# Patient Record
Sex: Female | Born: 1955 | Race: Black or African American | Hispanic: No | Marital: Married | Smoking: Current every day smoker
Health system: Southern US, Community
[De-identification: ages and names within clinical notes are randomized; demographics above are authoritative.]

## PROBLEM LIST (undated history)

## (undated) ENCOUNTER — Emergency Department: Admission: EM | Payer: Medicare HMO | Source: Home / Self Care

## (undated) DIAGNOSIS — F1020 Alcohol dependence, uncomplicated: Secondary | ICD-10-CM

## (undated) DIAGNOSIS — I10 Essential (primary) hypertension: Secondary | ICD-10-CM

## (undated) DIAGNOSIS — R569 Unspecified convulsions: Secondary | ICD-10-CM

## (undated) DIAGNOSIS — E119 Type 2 diabetes mellitus without complications: Secondary | ICD-10-CM

## (undated) DIAGNOSIS — E039 Hypothyroidism, unspecified: Secondary | ICD-10-CM

## (undated) HISTORY — PX: OTHER SURGICAL HISTORY: SHX169

---

## 2001-08-22 ENCOUNTER — Other Ambulatory Visit: Admission: RE | Admit: 2001-08-22 | Discharge: 2001-08-22 | Payer: Self-pay | Admitting: Family Medicine

## 2001-10-22 ENCOUNTER — Inpatient Hospital Stay (HOSPITAL_COMMUNITY): Admission: EM | Admit: 2001-10-22 | Discharge: 2001-10-26 | Payer: Self-pay | Admitting: *Deleted

## 2001-10-27 ENCOUNTER — Other Ambulatory Visit (HOSPITAL_COMMUNITY): Admission: RE | Admit: 2001-10-27 | Discharge: 2001-11-11 | Payer: Self-pay | Admitting: Psychiatry

## 2005-04-16 ENCOUNTER — Inpatient Hospital Stay: Payer: Self-pay | Admitting: Internal Medicine

## 2005-06-28 ENCOUNTER — Emergency Department: Payer: Self-pay | Admitting: Emergency Medicine

## 2005-06-28 ENCOUNTER — Other Ambulatory Visit: Payer: Self-pay

## 2005-07-07 ENCOUNTER — Emergency Department: Payer: Self-pay | Admitting: Emergency Medicine

## 2005-11-20 ENCOUNTER — Ambulatory Visit (HOSPITAL_COMMUNITY): Admission: RE | Admit: 2005-11-20 | Discharge: 2005-11-20 | Payer: Self-pay | Admitting: Family Medicine

## 2006-01-15 ENCOUNTER — Ambulatory Visit (HOSPITAL_COMMUNITY): Admission: RE | Admit: 2006-01-15 | Discharge: 2006-01-15 | Payer: Self-pay | Admitting: General Surgery

## 2006-01-15 ENCOUNTER — Encounter (INDEPENDENT_AMBULATORY_CARE_PROVIDER_SITE_OTHER): Payer: Self-pay | Admitting: Specialist

## 2007-01-11 ENCOUNTER — Inpatient Hospital Stay (HOSPITAL_COMMUNITY): Admission: EM | Admit: 2007-01-11 | Discharge: 2007-01-13 | Payer: Self-pay | Admitting: Emergency Medicine

## 2007-03-10 ENCOUNTER — Inpatient Hospital Stay: Payer: Self-pay | Admitting: Unknown Physician Specialty

## 2007-03-10 ENCOUNTER — Other Ambulatory Visit: Payer: Self-pay

## 2007-08-11 ENCOUNTER — Ambulatory Visit (HOSPITAL_COMMUNITY): Admission: RE | Admit: 2007-08-11 | Discharge: 2007-08-11 | Payer: Self-pay | Admitting: Family Medicine

## 2009-11-07 ENCOUNTER — Inpatient Hospital Stay: Payer: Self-pay | Admitting: Internal Medicine

## 2011-02-27 NOTE — H&P (Signed)
[facility]  Patient:    Shannon Herrera, Shannon Herrera Visit Number: [id number] MRN: [id number]          Service Type: PSY Location: 500 [id number] [id number] Attending Physician:  Jasmine Pang Dictated by:   Candi Leash. Orsini, N.P. Admit Date:  10/22/2001                     Psychiatric Admission Assessment  IDENTIFYING INFORMATION:  A 55 year old married female voluntary admitted on October 22, 2001 with a history of alcohol abuse and depression.  HISTORY OF PRESENT ILLNESS:  The patient presents with a history of depression and alcohol abuse.  The patients drinking habits have been escalating over the past month, drinking about a pint of gin every day.  The patient reports she drinks if she is depressed, isolating herself, having suicidal thoughts to jump off a balcony and overdose to "end it all."  The patient identifies no specific triggers, does feel some decrease in self esteem, unable to contribute to family financially.  The patient does want to stop drinking, is feeling very "tired."  Her sleep has decreased, appetite has decreased.  She has unknown weight loss.  She reports some positive visual hallucinations, no auditory hallucinations, no paranoia.  Patient is reporting some nausea at this time.  PAST PSYCHIATRIC HISTORY:  Second hospitalization to [facility], was at [facility] for depression, no outpatient treatment, no prior suicide attempt.  SOCIAL HISTORY:  She is a 55 year old married female, married for 18 years, 3 children ages 2, 24, and 37.  She lives with her husband and children.  She is unemployed.  No legal problems.  Completed high school and 2 years of college.  FAMILY HISTORY:  Father with alcohol.  Brother recovering alcoholic.  ALCOHOL DRUG HISTORY:  She is a nonsmoker.  She started drinking at the age of 20, has been drinking 1 pint of gin every day for the past month.  No history of detox, no history of blackouts or  seizures.  Last drink was on October 22, 2001, about 1:30.  Denies any substance abuse.  PAST MEDICAL HISTORY:  Primary care Vasil Juhasz is Dr. Sherrie Mustache in [place]. Medical problems are none.  Medications are Zoloft 50 mg for the past few months, Ambien 10 mg q.h.s.  DRUG ALLERGIES:  No known allergies.  PHYSICAL EXAMINATION  REVIEW OF SYSTEMS:  Patient denies any fever or chills, has a small change in appetite with no significant  weight loss.  No blurred or double vision.  No hearing loss or sinus pain.  No chest pain or palpitations.  No cough or shortness of breath.  Patient is a nonsmoker.  No heartburn or change in habits.  GI:  No dysuria, frequency, or hematuria.  MUSCULOSKELETAL:  No stiffness or swelling or joint pain.  SKIN: No itching, wounds or rash. NEUROLOGIC:  No weakness or numbness.  PSYCHIATRIC:  Recent history of depression with some suicidal thoughts.  No suicide attempts. ENDOCRINE:  No No endocrine or diabetic problems.  LYMPHATIC:  No history of anemia. ALLERGIES:  No environmental allergies.  GENERAL APPEARANCE:  A 55 year old female in no acute distress, well groomed, alert and cooperative.  VITAL SIGNS:  98.4, 100 pulse, 12 respirations, blood pressure 170/100.  HEAD:  Normocephalic, she can raise her eyebrows.  Her hair is clean, evenly distributed.  EYES:  EOMs are intact bilaterally.  MOUTH:  External ear canals are patent.  Her hearing is appropriate to conversation.  No sinus tenderness or nasal discharge.  Mucosa is moist, fair dentition. No lesions were seen. Tongue protrudes midline without tremor.  Can clench her teeth and puff out her cheeks.  NECK:  Supple, no JVD, negative lymphadenopathy.  Thyroid is nonpalpable and nontender.  Trachea is midline.  CHEST:  Clear to auscultation.  No adventitious sounds.  No cough.  HEART:  Tachycardic, no murmurs, gallops or rubs.  Carotid pulses are equal and adequate.  BREAST EXAM is  deferred.  ABDOMEN:  Soft, nontender abdomen.  MUSCULOSKELETAL:  No joint swelling or deformity.  Good range of motion. Muscle strength and tone is equal bilaterally.  SKIN:  Warm and dry with good turgor, no cyanosis.  Nail beds are clean. NEUROLOGIC:  She is oriented x 3.  Cranial nerves are grossly intact.  Good grip strength bilaterally.  No involuntary movements.  Gait is normal. Cerebellar functions were intact, with heel-to-shin, normal alternating movements.  Romberg was negative.  HEALTH MAINTENANCE ISSUES:  Addressed.  LABORATORY DATA:  WBC count is low at 2.7.  H&H is low ast 12.5 and 35.8. MCVs are low at 35.8.  Platelet count is 53,000.  Total bili is 2.7.  SGOT is 167, SGPT is 96, glucose is 146, BUN is 3.  MENTAL STATUS EXAMINATION:  Patient is an alert, middle-aged, average weight mix of African-American and Caucasian, casually dressed, cooperative, good eye contact.  Speech is normal and relevant.  Mood is sad, affect pleasant, sad and anxious.  Thought processes are coherent.  Patient is expressing some visual hallucinations prior to admission, no auditory hallucinations, no suicidal or homicidal ideations, no paranoia.  Cognitive function is intact. Memory is good, judgment is fair, insight is good.  ADMISSION DIAGNOSES: Axis I:    1. Depression not otherwise specified.            2. Alcohol dependence. Axis II:   Deferred. Axis III:  None. Axis IV:   Problems with economics, other psychosocial problems. Axis V:    Current 35, estimated this past year 42.  INITIAL PLAN OF CARE:  Voluntary admission to [facility] for depression and suicidal thoughts, alcohol abuse.  Contract for safety, check every 15 minutes.  Will initiate the phenobarbital protocol, encourage fluids, continue Zoloft, will monitor labs.  Will consult with [facility] for patients laboratory work.  Our goal is to detox safely and to stabilize mood and thinking so patient can be safe,  for patient to remain alcohol free, and follow up with mental health and AA.   TENTATIVE LENGTH OF STAY:  3 to 5 days. Dictated by:   Candi Leash. Orsini, N.P. Attending Physician:  Jasmine Pang DD:  10/15/01 TD:  10/25/01 Job: [id number]

## 2011-02-27 NOTE — Op Note (Signed)
Shannon Herrera, Shannon Herrera               ACCOUNT NO.:  [id number]   MEDICAL RECORD NO.:  [id number]          PATIENT TYPE:  AMB   LOCATION:  DAY                           FACILITY:  [facility]   PHYSICIAN:  Jerolyn Shin C. Katrinka Blazing, M.D.   DATE OF BIRTH:  15-Jun-1956   DATE OF PROCEDURE:  01/15/2006  DATE OF DISCHARGE:                                 OPERATIVE REPORT   PREOPERATIVE DIAGNOSES:  Upper back mass.   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   PROCEDURE:  Excision of fibrolipoma of the back full-thickness 6 x 6 cm.   SURGEON:  Dirk Dress. Katrinka Blazing, M.D.   DESCRIPTION OF PROCEDURE:  The patient was placed under general anesthesia.  She was positioned in the modified prone position. Her neck and upper back  were prepped and draped in a sterile field. A transverse incision was made  over the mass. The mass appeared to be a very fibrous mass that was  infiltrated with fatty tissue suggesting a fibrolipoma or a lipofibroma. It  did not come out easily and had to be circumferentially excised. This was  done with electrocautery. It extended down to the fascia. It was totally  removed. There was essentially no blood loss.  The edges of the wound were  slightly redundant but it was felt that once healing occurred that this  would be good in allowing the wound to even out. The superior and inferior  flaps were sutured down to the fascia using serial rows of 2-0 Monocryl. The  edges of the skin were then reapproximated using 3-0 Monocryl and the skin  was closed with subcuticular 4-0 Monocryl. A compression dressing was  placed. The patient tolerated the procedure well. She was turned to the  supine position, extubated without difficulty and transferred to the post  anesthesia care unit in satisfactory condition. The patient was noted to  have a platelet count of 67,000, white count of 2.3000 preoperatively.  Though this did not interfere with her surgery, this is something that needs  to  be worked up and this note is being placed so that her primary care  Joyell Emami can proceed to have her referred to hematology for evaluation of  her thrombocytopenia and leukopenia.      Dirk Dress. Katrinka Blazing, M.D.  Electronically Signed     LCS/MEDQ  D:  01/15/2006  T:  01/16/2006  Job:  [id number]

## 2011-02-27 NOTE — Discharge Summary (Signed)
[facility]  Patient:    Shannon Herrera, Shannon Herrera Visit Number: [id number] MRN: [id number]          Service Type: PSY Location: CIOP Attending Physician:  Rachael Fee Dictated by:   Reymundo Poll Dub Mikes, M.D. Admit Date:  10/27/2001 Discharge Date: 11/11/2001                             Discharge Summary  CHIEF COMPLAINT AND HISTORY OF PRESENT ILLNESS:  This was the second admission to [facility] for this 55 year old female voluntarily admitted.  History of alcohol dependence and depression.  Drinking habits had been escalating over the past month prior to this admission, drinking about a pint of gin every day.  Reported she drinks if she is depressed, isolating herself, having suicidal thoughts to jump off a balcony or overdose to end it all.  Identified no specific triggers, does feel some decreasing self-esteem, unable to contribute to family financially.  Does want to stop drinking, was feeling very tired.  Sleep has decreased, appetite has decreased and she has had some weight loss.  Positive for visual hallucinations, no auditory hallucinations, no paranoia.  Some nausea.  PAST PSYCHIATRIC HISTORY:  Second hospitalization at [facility].  Several attempts at outpatient treatment but noncompliant.  SUBSTANCE ABUSE HISTORY:  Drinking at the age of 74, has been drinking one pint of gin every day for the past month.  No history of withdrawal, no history of blackouts or seizures.  Last drink was January 11.  No other substance abuse.  PAST MEDICAL HISTORY:  Denied history of any major medical conditions.  MEDICATIONS ON ADMISSION: 1. Zoloft 50 mg per day. 2. Ambien 10 mg at bedtime for sleep.  PHYSICAL EXAMINATION:  GENERAL:  Performed and failed to show any acute findings.  MENTAL STATUS EXAMINATION ON ADMISSION:  Well-nourished, well-developed, alert, cooperative female, casually dressed, good eye  contact.  Speech was normal and relevant.  Mood was sad.  Affect was depressed, sad, and anxious. Thought processes: Coherent; expressing some visual hallucinations prior to this admission, no auditory hallucinations, no suicidal or homicidal ideas, no paranoia.  Cognitive: Intact.  ADMITTING DIAGNOSES: Axis I:    1. Alcohol dependence.            2. Depressive disorder, not otherwise specified. Axis II:   Deferred. Axis III:  No diagnosis. Axis IV:   Moderate. Axis V:    Global assessment of functioning upon admission 35, highest global            assessment of functioning in the last year 65-70.  LABORATORY DATA:  CBC showed white blood cells to be low from 2.7 to 2.3, hemoglobin from 11.7 to 12.5, hematocrit 37.8 to 33.3.  PT and PTT were within normal limits.  SGOT 167 then down to 78, SGPT 96 then down to 58, total bilirubin 2.7 down to 1.6.  TSH was 10.923.  Hepatitis profile: Positive for HCV.  HOSPITAL COURSE:  She was admitted and detoxified using phenobarbital. Detoxification went uneventfully.  She said that this time around she really wanted to abstain.  She was placed back on the Zoloft 50 mg per day.  SHe was given the Ambien that she used to take before.  She claimed increased understanding of the pattern of getting depressed and this triggering depression, expressed commitment to pursue further outpatient treatment this time around.  On January 19  she was in full contact with reality, fully detoxified, still experiencing the depression but no suicidal ideas, no homicidal ideas, willing and motivated to pursue further treatment so she was discharged to come to the [facility] for further work on her alcohol dependency.  DISCHARGE DIAGNOSES: Axis I:    1. Alcohol dependence.            2. Depressive disorder, not otherwise specified. Axis II:   No diagnosis. Axis III:  1. Hepatitis.            2. Anemia. Axis IV:   Moderate. Axis V:    Global assessment of  functioning upon discharge 55.  DISCHARGE MEDICATIONS: 1. Multivitamin. 2. Magnesium. 3. Atenolol 100 mg daily. 4. Zoloft 50 mg daily.  FOLLOWUP: 1. [facility]. 2. To see Dr. Sherrie Mustache, primary doctor, for further followup on her thyroid and    her hepatitis status. Dictated by:   Reymundo Poll Dub Mikes, M.D. Attending Physician:  Rachael Fee DD:  12/28/01 TD:  12/28/01 Job: [id number]

## 2011-02-27 NOTE — Discharge Summary (Signed)
NAMESAILOR, KALLENBERGER               ACCOUNT NO.:  [id number]   MEDICAL RECORD NO.:  [id number]          PATIENT TYPE:  INP   LOCATION:  A223                          FACILITY:  [facility]   PHYSICIAN:  Osvaldo Shipper, MD     DATE OF BIRTH:  1956/02/05   DATE OF ADMISSION:  01/11/2007  DATE OF DISCHARGE:  04/03/2008LH                               DISCHARGE SUMMARY   Please review H&P dictated by Dr. Benson Setting for details regarding patient's  presenting illness.   Patient goes to the health department to get her medical care.   DISCHARGE DIAGNOSES:  1. New onset type 2 diabetes.  2. Thrombocytopenia of unclear etiology.  3. History of hypertension and hypothyroidism, stable.   BRIEF HOSPITAL COURSE:  1. New onset diabetes.  Patient is a 55 year old female with a history      of hypertension and hypothyroidism, who presented to the emergency      room with complaints of weakness, visual disturbances, polydipsia,      polyuria.  Upon evaluation in the ED, she was found to have a blood      sugar of 774.  Patient denied any history of diabetes in the past.      She was not in ketoacidosis.  Patient was put on an insulin drip,      and her blood sugars were brought under control rather quickly.      She was started on Lantus insulin yesterday.  She was started on a      p.o. diet yesterday.  Patient has been doing well symptomatically.      Her visual symptoms have improved.  She is able to see more      clearly.  I am going to further adjust her Lantus dose today, and I      have asked her to maintain a diary with all of her CBGs, so that      she can take it to the health department, so that her Lantus may      further be adjusted if needed.  Patient received extensive diabetes      education, during her hospital stay.  She also viewed videos      regarding diabetes and management.  She was seen by a nutritionist.      She was taught how to administer insulin to herself.  I also  counseled her about potential medical complications resulting from      poorly-controlled diabetes.  I have also explained to her the need      to follow with an ophthalmologist, within the next month or so.  In      all, patient has received a comprehensive education regarding      diabetes care and management.  Blood sugar this morning is 217.  I      am going to give her an extra dose of Lantus and increase her basal      dose of Lantus, and hopefully this should control the sugars.  I      have told her that if her blood sugars stay about  200 consistently,      she needs to call her medical Toby Breithaupt, so that the doses may be      adjusted.  If her blood sugars go above 400, she needs to seek      attention immediately.  A Glucometer and testing supplies will also      be provided to the patient.   During the hospital stay, patient was found to have a platelet count of  66, hemoglobin 12.7, white count 3.6.  Review of her old labs reveal a  platelet count in the 60s back in April 2007, as well.  Patient reports  dealing with alcoholism about a year and a half ago, but she has been  abstaining from alcohol use since then.  I am not sure why she has a low  platelet count at this point, although it could be from remnants of her  alcohol toxicity.  She does not have a splenomegaly that I can detect,  based on physical examination.  I think the patient would benefit from  at least having one consultation with Dr. Mariel Sleet, which we will  arrange.  She does not have any evidence for any bleeding at this point.  A repeat CBC will be checked in one week's time, to ensure stability in  the counts.   Other medical issues including hypertension, hypothyroidism have been  stable. Patient also had mild acute renal failure, which also resolved  with IV hydration.   On the day of discharge, patient is feeling quite well.  She has  tolerated a p.o. intake, without any difficulty.  She has been   ambulating with no difficulty as well.  She is agreeable to go home  today.  Vital signs also show stability.  Blood pressure is a little bit  elevated, but I think once she resumes her medications, this will come  in control.  Her HBA1c unfortunately is still pending.  Examination  today was again unremarkable and stable from yesterday.  As mentioned  above, she had a white count of 3.6, platelet count of 66.  Her sodium  was 133, glucose 207 this morning.  Renal function normal.  HBA1c is  pending.  LDL was 98, triglycerides 884, HDL is 20.   ASSESSMENT/PLAN:  As above.   She also yesterday was found to have elevated AST and ALT, which also  will need to be evaluated as an outpatient.   DISCHARGE MEDICATIONS:  1. Lantus insulin 16 subcu every morning.  2. She may resume Synthroid 50 mcg daily.  3. Lisinopril/hydrochlorothiazide 10/12.5 once daily.   She has been asked to check her blood sugars three times daily, before  each meal.  Call her doctor, if values are more than 200 consistently or  below.   FOLLOWUP:  Follow up with her Reyna Lorenzi, Vertis Kelch, at the health  department within the next 5 days, to ensure close followup.   PENDING STUDIES:  We will send off Hepatitis profile before she is  discharged.  HBA1c is also pending at this point.   DIET:  Modified carbohydrate diet as instructed by the nutritionist.   PHYSICAL ACTIVITY:  No restrictions.   Total time of discharge 40 minutes.      Osvaldo Shipper, MD  Electronically Signed     GK/MEDQ  D:  01/13/2007  T:  01/13/2007  Job:  [id number]

## 2011-02-27 NOTE — H&P (Signed)
Shannon Herrera, Shannon Herrera                ACCOUNT NO.:  [id number]   MEDICAL RECORD NO.:  [id number]         PATIENT TYPE:  AMB   LOCATION:                                FACILITY:  [facility]   PHYSICIAN:  Jerolyn Shin C. Katrinka Blazing, M.D.   DATE OF BIRTH:  1955-11-29   DATE OF ADMISSION:  DATE OF DISCHARGE:  LH                                HISTORY & PHYSICAL   55 year old female with a history of a mass of her upper back with  increasing pain.  She has pain with movement.  There is no history of  injury.  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  She is scheduled  to have it excised under anesthesia.   PAST MEDICAL HISTORY:  She has hypothyroidism, depression, osteoarthritis.   ALLERGIES:  None.   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   MEDICATIONS:  Lexapro 20 mg daily, L-Thyroxine 50 mg daily, Doxycycline 100  mg daily.   FAMILY HISTORY:  Positive for hypertension, atherosclerotic heart disease,  diabetes, stroke, kidney disease.   PHYSICAL EXAMINATION:  VITAL SIGNS:  Blood pressure 150/82, pulse 76, respirations 20, weight 150  pounds.  HEENT:  Unremarkable.  NECK:  Supple, no JVD, bruit, adenopathy, or thyromegaly.  CHEST:  Clear to auscultation.  HEART:  Regular rate and rhythm without murmurs, gallops, and rubs.  ABDOMEN:  Soft, nontender, no masses.  BACK:  6 by 6 cm mass of the mid upper back at the base of the neck.  EXTREMITIES:  No cyanosis, clubbing, and edema.  NEUROLOGICAL:  No focal motor, sensory, or cerebellar deficits.   IMPRESSION:  Soft tissue mass, mid upper back, suspected lipoma.   PLAN:  Resection under anesthesia.      Dirk Dress. Katrinka Blazing, M.D.  Electronically Signed     LCS/MEDQ  D:  01/14/2006  T:  01/14/2006  Job:  [id number]   cc:   Tillman Abide, NP

## 2011-02-27 NOTE — H&P (Signed)
NAMESIARAH, Shannon Herrera               ACCOUNT NO.:  [id number]   MEDICAL RECORD NO.:  [id number]          PATIENT TYPE:  INP   LOCATION:  A223                          FACILITY:  [facility]   PHYSICIAN:  Marcello Moores, MD   DATE OF BIRTH:  09/04/1956   DATE OF ADMISSION:  01/11/2007  DATE OF DISCHARGE:  LH                              HISTORY & PHYSICAL   PMD:  Unassigned   CHIEF COMPLAINT:  Weakness, visual disturbance, polydipsia and polyuria  for the last 3 days   HISTORY OF PRESENT ILLNESS:  Shannon Herrera is a 55 year old female patient  with history of hypothyroidism and hypertension, presented to the  emergency room with the above complaints.  The patient stated that she  started to have visual disturbance, generalized weakness, polyuria and  polydipsia  for the last 3 days.  She had never had such problem before.  The patient stated that she had measured her blood glucose one years of  lower in [facility] and it was normal.  She was  treated for hypothyroidism and hypertension and she was neurologically  healthy.  She denied any GI complaints.  She denied any vomiting or  diarrhea.  She has no fever, no cough or chest pain.  She has polyuria  but not urgency.   REVIEW OF SYSTEMS:  The patient admitted she has visual disturbance with  lightheadedness but she has no other complaints and most of the  complaints are dictated on the HPI.   ALLERGIES:  NO KNOWN DRUG ALLERGIES   SOCIAL HISTORY:  She denied smoking, alcohol use and drug abuse.   FAMILY HISTORY:  She admitted that mother and grandfather and aunt have  diabetes mellitus   PAST MEDICAL HISTORY:  1. Hypothyroidism.  2. Hypertension.   HOME MEDICATIONS:  1. Levothyroxine 50 mcg daily.  2. Lisinopril hydrochlorothiazide 10 mg/12.5 mg once a day.   PHYSICAL EXAMINATION:  The patient is lying in the bed without any  respiratory distress.  VITAL SIGNS: Blood pressure is 120/76 and temperature is  97.8 and pulse  103 and respiratory rate is 18 per minute and she is saturating 98%.  HEENT: She has pink conjunctivae.  Nonicteric sclera slightly dry buccal  mucosa.  NECK:  Supple.  No lymphadenopathy.  CHEST: Good air entry bilateral, clear.  CVS: S1-S2 well heard, a little bit tachycardic.  No murmur.  ABDOMEN: Soft.  No area of tenderness.  Normoactive bowel sounds.  EXTREMITIES:  No pedal edema.  Peripheral pulses positive.  CNS: She is alert and well oriented.  No neurological deficit.   LABS:  ABG  on room, pH is 7.24 and pCO2 is 39, pO2 is 90, bicarb 25 and  saturation 97.7.  White blood cells 4.25 and hemoglobin is 15,  hematocrit 42, platelet count is 107.  On the chemistry, sodium is 124  and potassium is 3.9, chloride is 83, glucose is 774, BUN 26, creatinine  1.4, calcium is 10.2   ASSESSMENT:  1. Newly diagnosed diabetes mellitus, uncontrolled but there is no      sign of diabetic  ketoacidosis.  Plan:  We will put her on      Glucommander monitoring with IV insulin drip and will manage      according to the  Glucommander results and will monitor the      chemistry every 4 hours and will send hemoglobin A1c and will send      also tomorrow morning a lipid profile.  2. Dehydration secondary to the polyuria hyperglycemia and will      rehydrate her with normal saline  and will monitor her vital signs      and her rehydration.  The patient is educated briefly what is      diabetes mellitus and its complications and diet and she will be      more educated  on the floor about diabetic complications and diet      control and importance of regular follow-up.  3. Hypertension and hypothyroidism, stable.  Will continue with her      home medications.      Marcello Moores, MD  Electronically Signed     MT/MEDQ  D:  01/11/2007  T:  01/11/2007  Job:  [id number]

## 2014-02-05 ENCOUNTER — Ambulatory Visit: Payer: Self-pay | Admitting: Physician Assistant

## 2014-02-19 ENCOUNTER — Ambulatory Visit: Payer: Self-pay | Admitting: Internal Medicine

## 2014-02-19 DIAGNOSIS — Z0289 Encounter for other administrative examinations: Secondary | ICD-10-CM

## 2014-03-30 ENCOUNTER — Ambulatory Visit: Payer: Self-pay | Admitting: Internal Medicine

## 2018-01-22 ENCOUNTER — Emergency Department (HOSPITAL_COMMUNITY): Payer: Medicaid Other

## 2018-01-22 ENCOUNTER — Other Ambulatory Visit: Payer: Self-pay

## 2018-01-22 ENCOUNTER — Inpatient Hospital Stay (HOSPITAL_COMMUNITY)
Admission: EM | Admit: 2018-01-22 | Discharge: 2018-01-25 | DRG: 897 | Disposition: A | Discharge status: Home / Self Care | Payer: Self-pay | Attending: Internal Medicine | Admitting: Internal Medicine

## 2018-01-22 ENCOUNTER — Encounter (HOSPITAL_COMMUNITY): Payer: Self-pay | Admitting: Emergency Medicine

## 2018-01-22 DIAGNOSIS — F10939 Alcohol use, unspecified with withdrawal, unspecified: Secondary | ICD-10-CM | POA: Diagnosis present

## 2018-01-22 DIAGNOSIS — R739 Hyperglycemia, unspecified: Secondary | ICD-10-CM

## 2018-01-22 DIAGNOSIS — F10239 Alcohol dependence with withdrawal, unspecified: Secondary | ICD-10-CM | POA: Diagnosis present

## 2018-01-22 DIAGNOSIS — F419 Anxiety disorder, unspecified: Secondary | ICD-10-CM | POA: Diagnosis present

## 2018-01-22 DIAGNOSIS — E872 Acidosis, unspecified: Secondary | ICD-10-CM | POA: Diagnosis present

## 2018-01-22 DIAGNOSIS — K7030 Alcoholic cirrhosis of liver without ascites: Secondary | ICD-10-CM | POA: Diagnosis present

## 2018-01-22 DIAGNOSIS — I10 Essential (primary) hypertension: Secondary | ICD-10-CM

## 2018-01-22 DIAGNOSIS — Z9114 Patient's other noncompliance with medication regimen: Secondary | ICD-10-CM

## 2018-01-22 DIAGNOSIS — F10931 Alcohol use, unspecified with withdrawal delirium: Secondary | ICD-10-CM

## 2018-01-22 DIAGNOSIS — R569 Unspecified convulsions: Secondary | ICD-10-CM | POA: Diagnosis present

## 2018-01-22 DIAGNOSIS — N179 Acute kidney failure, unspecified: Secondary | ICD-10-CM

## 2018-01-22 DIAGNOSIS — R945 Abnormal results of liver function studies: Secondary | ICD-10-CM

## 2018-01-22 DIAGNOSIS — F329 Major depressive disorder, single episode, unspecified: Secondary | ICD-10-CM | POA: Diagnosis present

## 2018-01-22 DIAGNOSIS — D696 Thrombocytopenia, unspecified: Secondary | ICD-10-CM

## 2018-01-22 DIAGNOSIS — E1165 Type 2 diabetes mellitus with hyperglycemia: Secondary | ICD-10-CM | POA: Diagnosis present

## 2018-01-22 DIAGNOSIS — E039 Hypothyroidism, unspecified: Secondary | ICD-10-CM

## 2018-01-22 DIAGNOSIS — F10231 Alcohol dependence with withdrawal delirium: Principal | ICD-10-CM

## 2018-01-22 DIAGNOSIS — R7989 Other specified abnormal findings of blood chemistry: Secondary | ICD-10-CM | POA: Diagnosis present

## 2018-01-22 DIAGNOSIS — Z794 Long term (current) use of insulin: Secondary | ICD-10-CM

## 2018-01-22 DIAGNOSIS — D6959 Other secondary thrombocytopenia: Secondary | ICD-10-CM | POA: Diagnosis present

## 2018-01-22 DIAGNOSIS — Z79890 Hormone replacement therapy (postmenopausal): Secondary | ICD-10-CM

## 2018-01-22 DIAGNOSIS — E876 Hypokalemia: Secondary | ICD-10-CM | POA: Diagnosis present

## 2018-01-22 LAB — CBC WITH DIFFERENTIAL/PLATELET
BASOS ABS: 0 10*3/uL (ref 0.0–0.1)
Basophils Relative: 0 %
EOS PCT: 0 %
Eosinophils Absolute: 0 10*3/uL (ref 0.0–0.7)
HCT: 40.2 % (ref 36.0–46.0)
Hemoglobin: 13.3 g/dL (ref 12.0–15.0)
LYMPHS PCT: 41 %
Lymphs Abs: 0.9 10*3/uL (ref 0.7–4.0)
MCH: 31.4 pg (ref 26.0–34.0)
MCHC: 33.1 g/dL (ref 30.0–36.0)
MCV: 95 fL (ref 78.0–100.0)
MONO ABS: 0.2 10*3/uL (ref 0.1–1.0)
MONOS PCT: 11 %
Neutro Abs: 1.1 10*3/uL — ABNORMAL LOW (ref 1.7–7.7)
Neutrophils Relative %: 48 %
PLATELETS: 52 10*3/uL — AB (ref 150–400)
RBC: 4.23 MIL/uL (ref 3.87–5.11)
RDW: 14.2 % (ref 11.5–15.5)
WBC: 2.3 10*3/uL — ABNORMAL LOW (ref 4.0–10.5)

## 2018-01-22 LAB — ETHANOL

## 2018-01-22 LAB — COMPREHENSIVE METABOLIC PANEL
ALT: 317 U/L — ABNORMAL HIGH (ref 14–54)
ANION GAP: 16 — AB (ref 5–15)
AST: 507 U/L — AB (ref 15–41)
Albumin: 4.7 g/dL (ref 3.5–5.0)
Alkaline Phosphatase: 88 U/L (ref 38–126)
BILIRUBIN TOTAL: 1.9 mg/dL — AB (ref 0.3–1.2)
BUN: 16 mg/dL (ref 6–20)
CHLORIDE: 90 mmol/L — AB (ref 101–111)
CO2: 25 mmol/L (ref 22–32)
Calcium: 9.3 mg/dL (ref 8.9–10.3)
Creatinine, Ser: 1.14 mg/dL — ABNORMAL HIGH (ref 0.44–1.00)
GFR, EST AFRICAN AMERICAN: 58 mL/min — AB (ref >60)
GFR, EST NON AFRICAN AMERICAN: 50 mL/min — AB (ref >60)
Glucose, Bld: 522 mg/dL (ref 65–99)
POTASSIUM: 3.7 mmol/L (ref 3.5–5.1)
Sodium: 131 mmol/L — ABNORMAL LOW (ref 135–145)
TOTAL PROTEIN: 8.8 g/dL — AB (ref 6.5–8.1)

## 2018-01-22 LAB — AMMONIA: AMMONIA: 44 umol/L — AB (ref 9–35)

## 2018-01-22 LAB — I-STAT CG4 LACTIC ACID, ED: Lactic Acid, Venous: 5.04 mmol/L (ref 0.5–1.9)

## 2018-01-22 LAB — PHOSPHORUS: PHOSPHORUS: 2.6 mg/dL (ref 2.5–4.6)

## 2018-01-22 LAB — CBG MONITORING, ED: Glucose-Capillary: 158 mg/dL — ABNORMAL HIGH (ref 65–99)

## 2018-01-22 LAB — ACETAMINOPHEN LEVEL: Acetaminophen (Tylenol), Serum: <10 ug/mL — ABNORMAL LOW (ref 10–30)

## 2018-01-22 LAB — MAGNESIUM: MAGNESIUM: 1.6 mg/dL — AB (ref 1.7–2.4)

## 2018-01-22 LAB — TSH: TSH: 11.706 u[IU]/mL — ABNORMAL HIGH (ref 0.350–4.500)

## 2018-01-22 LAB — SALICYLATE LEVEL: Salicylate Lvl: <7 mg/dL (ref 2.8–30.0)

## 2018-01-22 LAB — CK: Total CK: 36 U/L — ABNORMAL LOW (ref 38–234)

## 2018-01-22 MED ORDER — MAGNESIUM SULFATE 4 GM/100ML IV SOLN
4.0000 g | Freq: Once | INTRAVENOUS | Status: AC
  Administered 2018-01-22: 4 g via INTRAVENOUS
  Filled 2018-01-22: qty 100

## 2018-01-22 MED ORDER — INSULIN ASPART 100 UNIT/ML ~~LOC~~ SOLN
SUBCUTANEOUS | Status: AC
  Filled 2018-01-22: qty 1

## 2018-01-22 MED ORDER — LORAZEPAM 2 MG/ML IJ SOLN
INTRAMUSCULAR | Status: AC
  Administered 2018-01-22: 2 mg via INTRAVENOUS
  Filled 2018-01-22: qty 1

## 2018-01-22 MED ORDER — MAGNESIUM SULFATE 2 GM/50ML IV SOLN: 2.0000 g | Freq: Once | INTRAVENOUS | Status: DC

## 2018-01-22 MED ORDER — POTASSIUM CHLORIDE IN NACL 40-0.9 MEQ/L-% IV SOLN
INTRAVENOUS | Status: DC
  Administered 2018-01-23 (×2): 125 mL/h via INTRAVENOUS
  Administered 2018-01-23: 75 mL/h via INTRAVENOUS

## 2018-01-22 MED ORDER — SODIUM CHLORIDE 0.9 % IV BOLUS
1000.0000 mL | Freq: Once | INTRAVENOUS | Status: AC
  Administered 2018-01-22: 1000 mL via INTRAVENOUS

## 2018-01-22 MED ORDER — INSULIN ASPART 100 UNIT/ML ~~LOC~~ SOLN
0.0000 [IU] | Freq: Three times a day (TID) | SUBCUTANEOUS | Status: DC
  Administered 2018-01-23 (×2): 2 [IU] via SUBCUTANEOUS
  Administered 2018-01-23 – 2018-01-24 (×2): 3 [IU] via SUBCUTANEOUS
  Administered 2018-01-24 – 2018-01-25 (×3): 2 [IU] via SUBCUTANEOUS

## 2018-01-22 MED ORDER — THIAMINE HCL 100 MG/ML IJ SOLN: 100.0000 mg | Freq: Every day | INTRAMUSCULAR | Status: DC

## 2018-01-22 MED ORDER — LEVETIRACETAM IN NACL 1000 MG/100ML IV SOLN
1000.0000 mg | Freq: Once | INTRAVENOUS | Status: AC
  Administered 2018-01-22: 1000 mg via INTRAVENOUS
  Filled 2018-01-22: qty 100

## 2018-01-22 MED ORDER — VITAMIN B-1 100 MG PO TABS
100.0000 mg | ORAL_TABLET | Freq: Every day | ORAL | Status: DC
  Administered 2018-01-24 – 2018-01-25 (×2): 100 mg via ORAL
  Filled 2018-01-22 (×2): qty 1

## 2018-01-22 MED ORDER — INSULIN GLARGINE 100 UNIT/ML ~~LOC~~ SOLN
10.0000 [IU] | Freq: Every day | SUBCUTANEOUS | Status: DC
  Administered 2018-01-23 – 2018-01-24 (×3): 10 [IU] via SUBCUTANEOUS
  Filled 2018-01-22 (×4): qty 0.1

## 2018-01-22 MED ORDER — LORAZEPAM 2 MG/ML IJ SOLN
2.0000 mg | Freq: Once | INTRAMUSCULAR | Status: AC
  Administered 2018-01-22 (×2): 2 mg via INTRAVENOUS

## 2018-01-22 MED ORDER — DIAZEPAM 5 MG PO TABS: 5.0000 mg | ORAL_TABLET | Freq: Three times a day (TID) | ORAL | Status: DC

## 2018-01-22 MED ORDER — FOLIC ACID 5 MG/ML IJ SOLN
1.0000 mg | Freq: Every day | INTRAMUSCULAR | Status: DC
  Administered 2018-01-23 – 2018-01-24 (×2): 1 mg via INTRAVENOUS
  Filled 2018-01-22 (×4): qty 0.2

## 2018-01-22 MED ORDER — LORAZEPAM 2 MG/ML IJ SOLN
0.0000 mg | Freq: Four times a day (QID) | INTRAMUSCULAR | Status: DC
  Administered 2018-01-22 – 2018-01-24 (×7): 2 mg via INTRAVENOUS
  Filled 2018-01-22 (×7): qty 1

## 2018-01-22 MED ORDER — SENNOSIDES-DOCUSATE SODIUM 8.6-50 MG PO TABS: 1.0000 | ORAL_TABLET | Freq: Every evening | ORAL | Status: DC | PRN

## 2018-01-22 MED ORDER — THIAMINE HCL 100 MG/ML IJ SOLN
100.0000 mg | Freq: Every day | INTRAMUSCULAR | Status: DC
  Administered 2018-01-23: 100 mg via INTRAVENOUS
  Filled 2018-01-22: qty 2

## 2018-01-22 MED ORDER — INSULIN ASPART 100 UNIT/ML ~~LOC~~ SOLN
0.0000 [IU] | Freq: Every day | SUBCUTANEOUS | Status: DC
  Administered 2018-01-23: 2 [IU] via SUBCUTANEOUS
  Administered 2018-01-24: 3 [IU] via SUBCUTANEOUS

## 2018-01-22 MED ORDER — ACETAMINOPHEN 650 MG RE SUPP: 650.0000 mg | Freq: Four times a day (QID) | RECTAL | Status: DC | PRN

## 2018-01-22 MED ORDER — ACETAMINOPHEN 325 MG PO TABS: 650.0000 mg | ORAL_TABLET | Freq: Four times a day (QID) | ORAL | Status: DC | PRN

## 2018-01-22 MED ORDER — SODIUM CHLORIDE 0.9 % IV BOLUS
1000.0000 mL | Freq: Once | INTRAVENOUS | Status: AC
  Administered 2018-01-23: 1000 mL via INTRAVENOUS

## 2018-01-22 MED ORDER — LORAZEPAM 2 MG/ML IJ SOLN
2.0000 mg | INTRAMUSCULAR | Status: DC | PRN
  Administered 2018-01-23 (×3): 2 mg via INTRAVENOUS
  Filled 2018-01-22: qty 1
  Filled 2018-01-22: qty 2
  Filled 2018-01-22 (×2): qty 1

## 2018-01-22 MED ORDER — ONDANSETRON HCL 4 MG PO TABS: 4.0000 mg | ORAL_TABLET | Freq: Four times a day (QID) | ORAL | Status: DC | PRN

## 2018-01-22 MED ORDER — THIAMINE HCL 100 MG/ML IJ SOLN
100.0000 mg | Freq: Once | INTRAMUSCULAR | Status: AC
  Administered 2018-01-22: 100 mg via INTRAVENOUS
  Filled 2018-01-22: qty 2

## 2018-01-22 MED ORDER — LORAZEPAM 2 MG/ML IJ SOLN
INTRAMUSCULAR | Status: AC
  Filled 2018-01-22: qty 1

## 2018-01-22 MED ORDER — LORAZEPAM 2 MG/ML IJ SOLN: 0.0000 mg | Freq: Two times a day (BID) | INTRAMUSCULAR | Status: DC

## 2018-01-22 MED ORDER — LEVOTHYROXINE SODIUM 100 MCG IV SOLR
50.0000 ug | Freq: Every day | INTRAVENOUS | Status: DC
  Administered 2018-01-22 – 2018-01-23 (×2): 50 ug via INTRAVENOUS
  Filled 2018-01-22 (×5): qty 5

## 2018-01-22 MED ORDER — LORAZEPAM 1 MG PO TABS
0.0000 mg | ORAL_TABLET | Freq: Four times a day (QID) | ORAL | Status: DC
  Filled 2018-01-22: qty 3

## 2018-01-22 MED ORDER — ONDANSETRON HCL 4 MG/2ML IJ SOLN: 4.0000 mg | Freq: Four times a day (QID) | INTRAMUSCULAR | Status: DC | PRN

## 2018-01-22 MED ORDER — LORAZEPAM 1 MG PO TABS
0.0000 mg | ORAL_TABLET | Freq: Two times a day (BID) | ORAL | Status: DC
Start: 2018-01-25 — End: 2018-01-24

## 2018-01-22 MED ORDER — INSULIN ASPART 100 UNIT/ML IV SOLN
10.0000 [IU] | Freq: Once | INTRAVENOUS | Status: AC
  Administered 2018-01-22: 10 [IU] via INTRAVENOUS

## 2018-01-22 NOTE — ED Notes (Signed)
Nurse and MD notified of elevated lactic acid

## 2018-01-22 NOTE — ED Provider Notes (Signed)
[facility] EMERGENCY DEPARTMENT Provider Note   CSN: [id number] Arrival date & time: 01/22/18  2000     History   Chief Complaint Chief Complaint  Patient presents with  . Altered Mental Status    HPI Shannon Herrera is a 62 y.o. female.  HPI Patient is unable to contribute history.  Level 5 caveat applies.  Patient's husband brought patient into the emergency department.  States that she drinks alcohol daily.  Unsure the amount.  Said that she was feeling shaky this morning and felt that she needed to drink.  Roughly 1 hour prior to presentation she began acting bizarre.  Had slurred words and erratic movements.  No previously similar symptoms.  Patient has history of diabetes but is not taking medication as far as her husband knows. History reviewed. No pertinent past medical history.  There are no active problems to display for this patient.   History reviewed. No pertinent surgical history.   OB History   None      Home Medications    Prior to Admission medications   Not on File    Family History History reviewed. No pertinent family history.  Social History Social History   Tobacco Use  . Smoking status: Unknown If Ever Smoked  Substance Use Topics  . Alcohol use: Yes    Comment: daily  . Drug use: Not on file     Allergies   Patient has no allergy information on record.   Review of Systems Review of Systems  Unable to perform ROS: Mental status change     Physical Exam Updated Vital Signs BP (!) 255/61   Pulse (!) 143   Temp (!) 100.5 F (38.1 C) (Rectal)   Resp 12   SpO2 100%   Physical Exam  Constitutional: She appears well-developed and well-nourished. She appears distressed.  Agitated  HENT:  Head: Normocephalic and atraumatic.  Mouth/Throat: Oropharynx is clear and moist.  Fixed left gaze deviation with horizontal nystagmus  Eyes:  Pupils 3 mm and minimally reactive.  Neck: Normal range of motion. Neck supple.  Forced  left-sided rotation of the neck.   Cardiovascular: Regular rhythm.  Tachycardia  Pulmonary/Chest: Effort normal and breath sounds normal.  Abdominal: Soft. Bowel sounds are normal. There is no tenderness. There is no rebound and no guarding.  Musculoskeletal: Normal range of motion. She exhibits no edema or tenderness.  Spastic limbs  Neurological:  Intermittently answering simple questions.  Appears to move all extremities.  Skin: Skin is warm and dry. Capillary refill takes less than 2 seconds. No rash noted. No erythema.  Nursing note and vitals reviewed.    ED Treatments / Results  Labs (all labs ordered are listed, but only abnormal results are displayed) Labs Reviewed  CBC WITH DIFFERENTIAL/PLATELET - Abnormal; Notable for the following components:      Result Value   WBC 2.3 (*)    Platelets 52 (*)    Neutro Abs 1.1 (*)    All other components within normal limits  COMPREHENSIVE METABOLIC PANEL - Abnormal; Notable for the following components:   Sodium 131 (*)    Chloride 90 (*)    Glucose, Bld 522 (*)    Creatinine, Ser 1.14 (*)    Total Protein 8.8 (*)    AST 507 (*)    ALT 317 (*)    Total Bilirubin 1.9 (*)    GFR calc non Af Amer 50 (*)    GFR calc Af Amer 58 (*)  Anion gap 16 (*)    All other components within normal limits  AMMONIA - Abnormal; Notable for the following components:   Ammonia 44 (*)    All other components within normal limits  ACETAMINOPHEN LEVEL - Abnormal; Notable for the following components:   Acetaminophen (Tylenol), Serum <10 (*)    All other components within normal limits  CK - Abnormal; Notable for the following components:   Total CK 36 (*)    All other components within normal limits  TSH - Abnormal; Notable for the following components:   TSH 11.706 (*)    All other components within normal limits  MAGNESIUM - Abnormal; Notable for the following components:   Magnesium 1.6 (*)    All other components within normal limits    I-STAT CG4 LACTIC ACID, ED - Abnormal; Notable for the following components:   Lactic Acid, Venous 5.04 (*)    All other components within normal limits  CULTURE, BLOOD (ROUTINE X 2)  CULTURE, BLOOD (ROUTINE X 2)  ETHANOL  SALICYLATE LEVEL  PHOSPHORUS  RAPID URINE DRUG SCREEN, HOSP PERFORMED  URINALYSIS, ROUTINE W REFLEX MICROSCOPIC  LACTIC ACID, PLASMA  LACTIC ACID, PLASMA    EKG EKG Interpretation  Date/Time:  Saturday January 22 2018 20:05:40 EDT Ventricular Rate:  146 PR Interval:    QRS Duration: 79 QT Interval:  282 QTC Calculation: 440 R Axis:   34 Text Interpretation:  Sinus tachycardia Consider right atrial enlargement Nonspecific repol abnormality, diffuse leads Confirmed by Loren RacerYelverton, Jemar Paulsen ([id number]) on 01/22/2018 8:30:06 PM   Radiology Ct Head Wo Contrast  Result Date: 01/22/2018 CLINICAL DATA:  Altered mental status EXAM: CT HEAD WITHOUT CONTRAST TECHNIQUE: Contiguous axial images were obtained from the base of the skull through the vertex without intravenous contrast. COMPARISON:  None. FINDINGS: Brain: The ventricles are normal in size and configuration. There is moderate parietal lobe atrophy bilaterally. There is no intracranial mass, hemorrhage, extra-axial fluid collection, or midline shift. There is patchy small vessel disease in the centra semiovale bilaterally. Elsewhere gray-white compartments appear unremarkable. No acute infarct evident. Vascular: No hyperdense vessel. There is calcification in each cavernous carotid artery. Skull: Bony calvarium appears intact. Sinuses/Orbits: There is mucosal thickening in several ethmoid air cells. Other paranasal sinuses are clear. Orbits appear symmetric bilaterally. Other: Mastoid air cells are clear. IMPRESSION: Ventricles normal in size and configuration. There is bilateral parietal lobe atrophy. There is patchy small vessel disease in the centra semiovale bilaterally. No evident acute infarct. No mass or hemorrhage. There  are foci of arterial vascular calcification. There is mucosal thickening in several ethmoid air cells. Electronically Signed   By: Bretta BangWilliam  Woodruff III M.D.   On: 01/22/2018 20:35    Procedures Procedures (including critical care time)  Medications Ordered in ED Medications  LORazepam (ATIVAN) injection 0-4 mg (2 mg Intravenous Given 01/22/18 2120)    Or  LORazepam (ATIVAN) tablet 0-4 mg ( Oral See Alternative 01/22/18 2120)  LORazepam (ATIVAN) injection 0-4 mg (has no administration in time range)    Or  LORazepam (ATIVAN) tablet 0-4 mg (has no administration in time range)  thiamine (VITAMIN B-1) tablet 100 mg (has no administration in time range)    Or  thiamine (B-1) injection 100 mg (has no administration in time range)  sodium chloride 0.9 % bolus 1,000 mL (1,000 mLs Intravenous New Bag/Given 01/22/18 2122)  magnesium sulfate IVPB 4 g 100 mL (has no administration in time range)  insulin aspart (novoLOG) 100 UNIT/ML injection (has  no administration in time range)  sodium chloride 0.9 % bolus 1,000 mL (has no administration in time range)    Followed by  sodium chloride 0.9 % bolus 1,000 mL (has no administration in time range)  magnesium sulfate IVPB 2 g 50 mL (has no administration in time range)  LORazepam (ATIVAN) injection 2 mg (2 mg Intravenous Given 01/22/18 2014)  thiamine (B-1) injection 100 mg (100 mg Intravenous Given 01/22/18 2121)  levETIRAcetam (KEPPRA) IVPB 1000 mg/100 mL premix (0 mg Intravenous Stopped 01/22/18 2133)  insulin aspart (novoLOG) injection 10 Units (10 Units Intravenous Given 01/22/18 2123)   CRITICAL CARE Performed by: Loren Racer Total critical care time: 35 minutes Critical care time was exclusive of separately billable procedures and treating other patients. Critical care was necessary to treat or prevent imminent or life-threatening deterioration. Critical care was time spent personally by me on the following activities: development of treatment  plan with patient and/or surrogate as well as nursing, discussions with consultants, evaluation of patient's response to treatment, examination of patient, obtaining history from patient or surrogate, ordering and performing treatments and interventions, ordering and review of laboratory studies, ordering and review of radiographic studies, pulse oximetry and re-evaluation of patient's condition.  Initial Impression / Assessment and Plan / ED Course  I have reviewed the triage vital signs and the nursing notes.  Pertinent labs & imaging results that were available during my care of the patient were reviewed by me and considered in my medical decision making (see chart for details).     Concern for partial seizure, alcohol withdrawal versus CVA or metabolic derangement.  Given 4 mg of Ativan and patient is now resting more comfortably.  Her tachycardia has improved as well as her hypertension.  Will get CT head and start on loading dose of IV Keppra and give IV thiamine.   No seizure-like movements.  CT head without acute abnormality.  Patient does have laboratory evidence of alcoholism with elevated LFTs and leukopenia and thrombocytopenia.  Elevation in lactic acid likely due to prolonged seizure.  Low suspicion for infectious process.  Blood cultures have been sent.  Patient's been placed on CIWA protocol.  Discussed with hospitalist will see patient in the emergency department and admit to stepdown bed. Final Clinical Impressions(s) / ED Diagnoses   Final diagnoses:  Delirium tremens [facility])    ED Discharge Orders    None       Loren Racer, MD 01/22/18 2137

## 2018-01-22 NOTE — ED Triage Notes (Signed)
Pt not acting herself, per husband she is alcoholic and has had one drink today. edp at bedside.

## 2018-01-22 NOTE — H&P (Signed)
History and Physical  Shannon Herrera:[id number] DOB: May 29, 1956 DOA: 01/22/2018  Referring physician: Dr Ranae Palms, ED physician PCP: No primary care provider on file.  Outpatient Specialists: none  Patient Coming From: home  Chief Complaint: Altered mental status  HPI: Shannon Herrera is a 62 y.o. female with a history of alcoholism, hypertension.  Patient is unable to provide history as she is cognitively impaired.  Patient's estranged husband brought her to the emergency department.  She drinks alcohol on a daily basis, but is unsure of the amount.  She was feeling shaky this morning and began to drink.  Patient began to act bizarrely around 7 PM -she had slurred words and arriving movements.  She had no similar symptoms prior to this.  She does have a history of diabetes and has not been taking her medications that he is aware of.  Patient husband does not know what medications she takes.  Emergency Department Course: Patient had what looked like seizure-like activity in the emergency department.  She received 4 mg of IV Ativan, followed by Keppra.  She had a head CT without abnormalities.  LFTs are grossly elevated.  She had a lactic acid of 5.  White count normal.  Patient thrombocytopenic.  Blood cultures obtained.  Glucose 522.  Review of Systems:  Patient unable to provide, although denies pain  History reviewed. No pertinent past medical history. History reviewed. No pertinent surgical history. Social History:  reports that she drinks alcohol. Her tobacco and drug histories are not on file. Patient lives at home  No Known Allergies  Unable to obtain family history due to patient condition  Prior to Admission medications   Not on File    Physical Exam: BP (!) 255/61   Pulse (!) 143   Temp (!) 100.5 F (38.1 C) (Rectal)   Resp 12   SpO2 100%   . General: Older Caucasian female. Awake and semi-alert.  Unable to determine orientation at this time. No acute  cardiopulmonary distress.  Marland Kitchen HEENT: Normocephalic atraumatic.  Right and left ears normal in appearance.  Pupils equal, round, reactive to light. Extraocular muscles are intact. Sclerae anicteric and noninjected.  Moist mucosal membranes. No mucosal lesions.  . Neck: Neck supple without lymphadenopathy. No carotid bruits. No masses palpated.  . Cardiovascular: Regular rate with normal S1-S2 sounds. No murmurs, rubs, gallops auscultated. No JVD.  Marland Kitchen Respiratory: Good respiratory effort with no wheezes, rales, rhonchi. Lungs clear to auscultation bilaterally.  No accessory muscle use. . Abdomen: Soft, nontender, nondistended. Active bowel sounds. No masses or hepatosplenomegaly  . Skin: No rashes, lesions, or ulcerations.  Dry, warm to touch. 2+ dorsalis pedis and radial pulses. . Musculoskeletal: No calf or leg pain. All major joints not erythematous nontender.  No upper or lower joint deformation.  Good ROM.  No contractures  . Psychiatric: Unable to obtain . Neurologic: Patient unable to comply with commands, but no gross focal neurological deficits.           Labs on Admission: I have personally reviewed following labs and imaging studies  CBC: Recent Labs  Lab 01/22/18 2001  WBC 2.3*  NEUTROABS 1.1*  HGB 13.3  HCT 40.2  MCV 95.0  PLT 52*   Basic Metabolic Panel: Recent Labs  Lab 01/22/18 2001 01/22/18 2101  NA 131*  --   K 3.7  --   CL 90*  --   CO2 25  --   GLUCOSE 522*  --   BUN 16  --  CREATININE 1.14*  --   CALCIUM 9.3  --   MG  --  1.6*  PHOS  --  2.6   GFR: CrCl cannot be calculated (Unknown ideal weight.). Liver Function Tests: Recent Labs  Lab 01/22/18 2001  AST 507*  ALT 317*  ALKPHOS 88  BILITOT 1.9*  PROT 8.8*  ALBUMIN 4.7   No results for input(s): LIPASE, AMYLASE in the last 168 hours. Recent Labs  Lab 01/22/18 2001  AMMONIA 44*   Coagulation Profile: No results for input(s): INR, PROTIME in the last 168 hours. Cardiac Enzymes: Recent  Labs  Lab 01/22/18 2001  CKTOTAL 36*   BNP (last 3 results) No results for input(s): PROBNP in the last 8760 hours. HbA1C: No results for input(s): HGBA1C in the last 72 hours. CBG: No results for input(s): GLUCAP in the last 168 hours. Lipid Profile: No results for input(s): CHOL, HDL, LDLCALC, TRIG, CHOLHDL, LDLDIRECT in the last 72 hours. Thyroid Function Tests: Recent Labs    01/22/18 2001  TSH 11.706*   Anemia Panel: No results for input(s): VITAMINB12, FOLATE, FERRITIN, TIBC, IRON, RETICCTPCT in the last 72 hours. Urine analysis: No results found for: COLORURINE, APPEARANCEUR, LABSPEC, PHURINE, GLUCOSEU, HGBUR, BILIRUBINUR, KETONESUR, PROTEINUR, UROBILINOGEN, NITRITE, LEUKOCYTESUR Sepsis Labs: @LABRCNTIP (procalcitonin:4,lacticidven:4) )No results found for this or any previous visit (from the past 240 hour(s)).   Radiological Exams on Admission: Ct Head Wo Contrast  Result Date: 01/22/2018 CLINICAL DATA:  Altered mental status EXAM: CT HEAD WITHOUT CONTRAST TECHNIQUE: Contiguous axial images were obtained from the base of the skull through the vertex without intravenous contrast. COMPARISON:  None. FINDINGS: Brain: The ventricles are normal in size and configuration. There is moderate parietal lobe atrophy bilaterally. There is no intracranial mass, hemorrhage, extra-axial fluid collection, or midline shift. There is patchy small vessel disease in the centra semiovale bilaterally. Elsewhere gray-white compartments appear unremarkable. No acute infarct evident. Vascular: No hyperdense vessel. There is calcification in each cavernous carotid artery. Skull: Bony calvarium appears intact. Sinuses/Orbits: There is mucosal thickening in several ethmoid air cells. Other paranasal sinuses are clear. Orbits appear symmetric bilaterally. Other: Mastoid air cells are clear. IMPRESSION: Ventricles normal in size and configuration. There is bilateral parietal lobe atrophy. There is patchy  small vessel disease in the centra semiovale bilaterally. No evident acute infarct. No mass or hemorrhage. There are foci of arterial vascular calcification. There is mucosal thickening in several ethmoid air cells. Electronically Signed   By: Bretta Bang III M.D.   On: 01/22/2018 20:35    EKG: Independently reviewed.  Sinus tachycardia  Assessment/Plan: Principal Problem:   Alcohol withdrawal syndrome with complication, with unspecified complication (HCC) Active Problems:   Hyperglycemia   Essential hypertension   Elevated LFTs   Lactic acidosis   Hypothyroidism   Thrombocytopenia (HCC)   Hypomagnesemia   Acute renal injury [facility])    This patient was discussed with the ED physician, including pertinent vitals, physical exam findings, labs, and imaging.  We also discussed care given by the ED provider.  1. Alcohol withdrawal with possible DTs/alcohol withdrawal seizure a. Admit to stepdown b. Alcohol withdrawal precautions c. We will schedule Valium 5 mg every 8 hours d. Ativan PRN e. There is little evidence for Keppra alcohol withdrawal seizures -will discontinue and continue with benzodiazepines for alcohol withdrawal seizure prevention 2. Hyperglycemia a. No evidence of ketoacidosis or hyperglycemic hyperosmolar state b. Patient received 10 units regular insulin IV c. We will recheck blood sugar d. CBGs every 4 hours  with insulin as needed e. We will also give her Lantus 10 units 3. Hypertension a. Blood pressure significantly improved from initial: 130s 140s systolically. b. We will hold on blood pressure treatment at this time 4. Elevated LFTs a. 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 Acute hepatitis panel c. Hepatic ultrasound 5. Lactic acidosis a. Likely secondary from prolonged seizure b. No evidence of infection: No fever and white count normal c. Blood cultures already obtained d. Recheck lactic acid level in 3 hours 6. Hypothyroidism a. Synthroid IV  for now, convert to p.o. when patient more alert 7. Thrombocytopenia a. Likely secondary from alcohol use and hepatic cirrhosis 8. Hypomagnesemia a. Replace 9. Acute renal injury a. Recheck creatinine in the morning b. Fluid hydration  DVT prophylaxis: SCDs Consultants: None Code Status: full code Family Communication: Husband in the room Disposition Plan: Patient to return home following glycemic control and resolution of alcohol withdrawal symptoms   Levie Heritage, DO [facility] Pager [phone]  If 7PM-7AM, please contact night-coverage [email or web address] Password [email or web address]

## 2018-01-23 ENCOUNTER — Inpatient Hospital Stay (HOSPITAL_COMMUNITY): Payer: Medicaid Other

## 2018-01-23 DIAGNOSIS — F10239 Alcohol dependence with withdrawal, unspecified: Secondary | ICD-10-CM

## 2018-01-23 LAB — CBC
HEMATOCRIT: 32.5 % — AB (ref 36.0–46.0)
Hemoglobin: 11 g/dL — ABNORMAL LOW (ref 12.0–15.0)
MCH: 32 pg (ref 26.0–34.0)
MCHC: 33.8 g/dL (ref 30.0–36.0)
MCV: 94.5 fL (ref 78.0–100.0)
PLATELETS: 36 10*3/uL — AB (ref 150–400)
RBC: 3.44 MIL/uL — AB (ref 3.87–5.11)
RDW: 14.3 % (ref 11.5–15.5)
WBC: 2 10*3/uL — AB (ref 4.0–10.5)

## 2018-01-23 LAB — COMPREHENSIVE METABOLIC PANEL
ALBUMIN: 3.3 g/dL — AB (ref 3.5–5.0)
ALT: 198 U/L — ABNORMAL HIGH (ref 14–54)
AST: 283 U/L — AB (ref 15–41)
Alkaline Phosphatase: 53 U/L (ref 38–126)
Anion gap: 10 (ref 5–15)
BILIRUBIN TOTAL: 1.2 mg/dL (ref 0.3–1.2)
BUN: 10 mg/dL (ref 6–20)
CHLORIDE: 107 mmol/L (ref 101–111)
CO2: 23 mmol/L (ref 22–32)
CREATININE: 0.58 mg/dL (ref 0.44–1.00)
Calcium: 7.4 mg/dL — ABNORMAL LOW (ref 8.9–10.3)
GFR calc Af Amer: >60 mL/min (ref >60)
GFR calc non Af Amer: >60 mL/min (ref >60)
GLUCOSE: 238 mg/dL — AB (ref 65–99)
POTASSIUM: 3.1 mmol/L — AB (ref 3.5–5.1)
Sodium: 140 mmol/L (ref 135–145)
Total Protein: 6 g/dL — ABNORMAL LOW (ref 6.5–8.1)

## 2018-01-23 LAB — URINALYSIS, ROUTINE W REFLEX MICROSCOPIC
Bilirubin Urine: NEGATIVE
KETONES UR: NEGATIVE mg/dL
LEUKOCYTES UA: NEGATIVE
NITRITE: NEGATIVE
PH: 6 (ref 5.0–8.0)
PROTEIN: NEGATIVE mg/dL
Specific Gravity, Urine: 1.009 (ref 1.005–1.030)
Squamous Epithelial / LPF: NONE SEEN

## 2018-01-23 LAB — GLUCOSE, CAPILLARY
GLUCOSE-CAPILLARY: 185 mg/dL — AB (ref 65–99)
GLUCOSE-CAPILLARY: 141 mg/dL — AB (ref 65–99)
GLUCOSE-CAPILLARY: 169 mg/dL — AB (ref 65–99)
GLUCOSE-CAPILLARY: 238 mg/dL — AB (ref 65–99)
Glucose-Capillary: 237 mg/dL — ABNORMAL HIGH (ref 65–99)
Glucose-Capillary: 128 mg/dL — ABNORMAL HIGH (ref 65–99)

## 2018-01-23 LAB — HEMOGLOBIN A1C
Hgb A1c MFr Bld: 8.1 % — ABNORMAL HIGH (ref 4.8–5.6)
MEAN PLASMA GLUCOSE: 185.77 mg/dL

## 2018-01-23 LAB — LACTIC ACID, PLASMA
LACTIC ACID, VENOUS: 2.4 mmol/L — AB (ref 0.5–1.9)
Lactic Acid, Venous: 1.5 mmol/L (ref 0.5–1.9)
Lactic Acid, Venous: 1.9 mmol/L (ref 0.5–1.9)

## 2018-01-23 LAB — RAPID URINE DRUG SCREEN, HOSP PERFORMED
Amphetamines: NOT DETECTED
BARBITURATES: NOT DETECTED
BENZODIAZEPINES: POSITIVE — AB
Cocaine: NOT DETECTED
OPIATES: NOT DETECTED
Tetrahydrocannabinol: NOT DETECTED

## 2018-01-23 LAB — BASIC METABOLIC PANEL
ANION GAP: 9 (ref 5–15)
BUN: 9 mg/dL (ref 6–20)
CALCIUM: 6.9 mg/dL — AB (ref 8.9–10.3)
CO2: 20 mmol/L — ABNORMAL LOW (ref 22–32)
Chloride: 110 mmol/L (ref 101–111)
Creatinine, Ser: 0.71 mg/dL (ref 0.44–1.00)
GFR calc non Af Amer: >60 mL/min (ref >60)
Glucose, Bld: 159 mg/dL — ABNORMAL HIGH (ref 65–99)
POTASSIUM: 4 mmol/L (ref 3.5–5.1)
Sodium: 139 mmol/L (ref 135–145)

## 2018-01-23 LAB — MRSA PCR SCREENING: MRSA by PCR: NEGATIVE

## 2018-01-23 MED ORDER — LABETALOL HCL 5 MG/ML IV SOLN
20.0000 mg | INTRAVENOUS | Status: DC | PRN
  Administered 2018-01-23: 20 mg via INTRAVENOUS
  Filled 2018-01-23: qty 4

## 2018-01-23 MED ORDER — LORAZEPAM 2 MG/ML IJ SOLN
3.0000 mg | Freq: Once | INTRAMUSCULAR | Status: AC
  Administered 2018-01-23: 3 mg via INTRAVENOUS

## 2018-01-23 MED ORDER — HALOPERIDOL LACTATE 5 MG/ML IJ SOLN
5.0000 mg | Freq: Once | INTRAMUSCULAR | Status: AC
  Administered 2018-01-23: 5 mg via INTRAVENOUS
  Filled 2018-01-23: qty 1

## 2018-01-23 MED ORDER — SODIUM CHLORIDE 0.9 % IV BOLUS
500.0000 mL | Freq: Once | INTRAVENOUS | Status: AC
  Administered 2018-01-23: 500 mL via INTRAVENOUS

## 2018-01-23 NOTE — Progress Notes (Signed)
Critical lab result of Lactic Acid being 2.4. Dr. Robb Matarrtiz made aware

## 2018-01-23 NOTE — Progress Notes (Signed)
PROGRESS NOTE    Shannon Herrera  FAO:[id number] DOB: 11/15/1955 DOA: 01/22/2018 PCP: Patient, No Pcp Per   Brief Narrative:   Shannon Herrera is a 62 y.o. female with a history of alcoholism, hypertension.  Patient is unable to provide history as she is cognitively impaired.  Patient drinks alcohol on a daily basis and had some alcohol this morning as she was feeling quite shaky.  She has been acting quite unusual and has not been taking her diabetes medications.  She was noted to have alcohol withdrawal with possible DTs and withdrawal seizure as well as hyperglycemia for which she has been admitted to stepdown unit with Valium every 8 hours and Ativan as needed.  Her blood glucose has demonstrated improvement with use of insulin.  She is noted to have thrombocytopenia as well that is currently being monitored and is likely secondary to alcohol use and hepatic cirrhosis.  Assessment & Plan:   Principal Problem:   Alcohol withdrawal syndrome with complication, with unspecified complication (HCC) Active Problems:   Hyperglycemia   Essential hypertension   Elevated LFTs   Lactic acidosis   Hypothyroidism   Thrombocytopenia (HCC)   Hypomagnesemia   Acute renal injury (HCC)   1. Alcohol withdrawal with associated seizure activity.  Continue to monitor in stepdown unit with CIWA protocol.  She has had some increased sedation for which I will currently discontinue Valium. 2. Hyperglycemia with home medication noncompliance.  Much improved with use of insulin.  Continue to monitor as diet is started and patient is more alert. 3. Hypertension.  Currently stable. 4. Transaminitis.  This is likely secondary to alcoholic cirrhosis.  Acute hepatitis panel as well as liver ultrasound pending.  LFTs are improved this morning. 5. AK I.  This appears to be resolved with use of IV fluid with creatinine improved this morning.  Decrease IV fluid rate to 75 cc/h. 6. Thrombocytopenia.  Maintain on SCDs.   No signs of acute bleeding.  This has been downtrending and is likely secondary to alcohol use and hepatic cirrhosis.  Will transfuse platelets as needed. 7. Hypomagnesemia.  Currently undergoing replacement.  Will recheck in a.m. 8. Hypokalemia.  Continue current IV fluid with potassium supplementation and recheck labs in a.m. 9. Hypothyroidism.  Will convert IV to p.o. once patient more alert and tolerating diet.  TSH is noted to be elevated for which I will increase her home dose. 10. Lactic acidosis.  Likely secondary to prolonged seizure with no sign of infection.  Recheck in a.m. as this has been stable.   DVT prophylaxis:SCDs Code Status: Full Family Communication: None at bedside Disposition Plan: Continue treatment of alcohol withdrawal symptoms with further workup of elevated LFTs and treatment of AK I   Consultants:   None  Procedures:   None  Antimicrobials:   None   Subjective: Patient seen and evaluated today and is starting to become more alert but was quite sedated overnight.  No other acute events have been noted since admission.  Objective: Vitals:   01/23/18 0700 01/23/18 0722 01/23/18 0800 01/23/18 0900  BP: 112/80  112/81 107/64  Pulse: 88 86 87   Resp: 17 16 13 17   Temp:  99.2 F (37.3 C)    TempSrc:  Axillary    SpO2: 100% 100% 100%   Weight:      Height:        Intake/Output Summary (Last 24 hours) at 01/23/2018 1041 Last data filed at 01/23/2018 0900 Gross per 24  hour  Intake 1412.5 ml  Output 850 ml  Net 562.5 ml   Filed Weights   01/22/18 2330  Weight: 53.5 kg (117 lb 15.1 oz)    Examination:  General exam: Sedated Respiratory system: Clear to auscultation. Respiratory effort normal. Cardiovascular system: S1 & S2 heard, RRR. No JVD, murmurs, rubs, gallops or clicks. No pedal edema. Gastrointestinal system: Abdomen is nondistended, soft and nontender. No organomegaly or masses felt. Normal bowel sounds heard. Central nervous  system: Alert and oriented. No focal neurological deficits. Skin: No rashes, lesions or ulcers    Data Reviewed: I have personally reviewed following labs and imaging studies  CBC: Recent Labs  Lab 01/22/18 2001 01/23/18 0359  WBC 2.3* 2.0*  NEUTROABS 1.1*  --   HGB 13.3 11.0*  HCT 40.2 32.5*  MCV 95.0 94.5  PLT 52* 36*   Basic Metabolic Panel: Recent Labs  Lab 01/22/18 2001 01/22/18 2101 01/23/18 0514  NA 131*  --  140  K 3.7  --  3.1*  CL 90*  --  107  CO2 25  --  23  GLUCOSE 522*  --  238*  BUN 16  --  10  CREATININE 1.14*  --  0.58  CALCIUM 9.3  --  7.4*  MG  --  1.6*  --   PHOS  --  2.6  --    GFR: Estimated Creatinine Clearance: 60.8 mL/min (by C-G formula based on SCr of 0.58 mg/dL). Liver Function Tests: Recent Labs  Lab 01/22/18 2001 01/23/18 0514  AST 507* 283*  ALT 317* 198*  ALKPHOS 88 53  BILITOT 1.9* 1.2  PROT 8.8* 6.0*  ALBUMIN 4.7 3.3*   No results for input(s): LIPASE, AMYLASE in the last 168 hours. Recent Labs  Lab 01/22/18 2001  AMMONIA 44*   Coagulation Profile: No results for input(s): INR, PROTIME in the last 168 hours. Cardiac Enzymes: Recent Labs  Lab 01/22/18 2001  CKTOTAL 36*   BNP (last 3 results) No results for input(s): PROBNP in the last 8760 hours. HbA1C: No results for input(s): HGBA1C in the last 72 hours. CBG: Recent Labs  Lab 01/22/18 2307 01/23/18 0454 01/23/18 0720  GLUCAP 158* 237* 185*   Lipid Profile: No results for input(s): CHOL, HDL, LDLCALC, TRIG, CHOLHDL, LDLDIRECT in the last 72 hours. Thyroid Function Tests: Recent Labs    01/22/18 2001  TSH 11.706*   Anemia Panel: No results for input(s): VITAMINB12, FOLATE, FERRITIN, TIBC, IRON, RETICCTPCT in the last 72 hours. Sepsis Labs: Recent Labs  Lab 01/22/18 2120 01/23/18 0053 01/23/18 0359 01/23/18 0806  LATICACIDVEN 5.04* 1.5 2.4* 1.9    Recent Results (from the past 240 hour(s))  Culture, blood (Routine X 2) w Reflex to ID Panel      Status: None (Preliminary result)   Collection Time: 01/22/18  9:13 PM  Result Value Ref Range Status   Specimen Description LEFT ANTECUBITAL  Final   Special Requests   Final    BOTTLES DRAWN AEROBIC ONLY Blood Culture results may not be optimal due to an inadequate volume of blood received in culture bottles   Culture   Final    NO GROWTH < 12 HOURS Performed at [facility], [address] [place], [place] [address]    Report Status PENDING  Incomplete  Culture, blood (Routine X 2) w Reflex to ID Panel     Status: None (Preliminary result)   Collection Time: 01/22/18  9:20 PM  Result Value Ref Range Status  Specimen Description BLOOD RIGHT ARM  Final   Special Requests   Final    BOTTLES DRAWN AEROBIC ONLY Blood Culture results may not be optimal due to an inadequate volume of blood received in culture bottles   Culture   Final    NO GROWTH < 12 HOURS Performed at [facility], [address] [place], [place] [address]    Report Status PENDING  Incomplete  MRSA PCR Screening     Status: None   Collection Time: 01/22/18 11:30 PM  Result Value Ref Range Status   MRSA by PCR NEGATIVE NEGATIVE Final    Comment:        The GeneXpert MRSA Assay (FDA approved for NASAL specimens only), is one component of a comprehensive MRSA colonization surveillance program. It is not intended to diagnose MRSA infection nor to guide or monitor treatment for MRSA infections. Performed at [facility], [address] [place], [place] [address]          Radiology Studies: Ct Head Wo Contrast  Result Date: 01/22/2018 CLINICAL DATA:  Altered mental status EXAM: CT HEAD WITHOUT CONTRAST TECHNIQUE: Contiguous axial images were obtained from the base of the skull through the vertex without intravenous contrast. COMPARISON:  None. FINDINGS: Brain: The ventricles are normal in size and configuration. There is moderate parietal lobe atrophy bilaterally. There is no intracranial mass,  hemorrhage, extra-axial fluid collection, or midline shift. There is patchy small vessel disease in the centra semiovale bilaterally. Elsewhere gray-white compartments appear unremarkable. No acute infarct evident. Vascular: No hyperdense vessel. There is calcification in each cavernous carotid artery. Skull: Bony calvarium appears intact. Sinuses/Orbits: There is mucosal thickening in several ethmoid air cells. Other paranasal sinuses are clear. Orbits appear symmetric bilaterally. Other: Mastoid air cells are clear. IMPRESSION: Ventricles normal in size and configuration. There is bilateral parietal lobe atrophy. There is patchy small vessel disease in the centra semiovale bilaterally. No evident acute infarct. No mass or hemorrhage. There are foci of arterial vascular calcification. There is mucosal thickening in several ethmoid air cells. Electronically Signed   By: Bretta Bang III M.D.   On: 01/22/2018 20:35        Scheduled Meds: . diazepam  5 mg Oral Q8H  . folic acid  1 mg Intravenous Daily  . insulin aspart  0-15 Units Subcutaneous TID WC  . insulin aspart  0-5 Units Subcutaneous QHS  . insulin glargine  10 Units Subcutaneous QHS  . levothyroxine  50 mcg Intravenous Daily  . LORazepam  0-4 mg Intravenous Q6H   Or  . LORazepam  0-4 mg Oral Q6H  . [START ON 01/25/2018] LORazepam  0-4 mg Intravenous Q12H   Or  . [START ON 01/25/2018] LORazepam  0-4 mg Oral Q12H  . thiamine  100 mg Oral Daily   Or  . thiamine  100 mg Intravenous Daily  . thiamine  100 mg Intravenous Daily   Continuous Infusions: . 0.9 % NaCl with KCl 40 mEq / L 125 mL/hr (01/23/18 1023)     LOS: 1 day    Time spent: 30 minutes    Maty Zeisler Hoover Brunette, DO [facility] Pager [phone]  If 7PM-7AM, please contact night-coverage [email or web address] Password [facility] 01/23/2018, 10:41 AM

## 2018-01-24 ENCOUNTER — Other Ambulatory Visit: Payer: Self-pay

## 2018-01-24 ENCOUNTER — Encounter (HOSPITAL_COMMUNITY): Payer: Self-pay

## 2018-01-24 LAB — GLUCOSE, CAPILLARY
GLUCOSE-CAPILLARY: 482 mg/dL — AB (ref 65–99)
GLUCOSE-CAPILLARY: 191 mg/dL — AB (ref 65–99)
GLUCOSE-CAPILLARY: 146 mg/dL — AB (ref 65–99)
Glucose-Capillary: 166 mg/dL — ABNORMAL HIGH (ref 65–99)
Glucose-Capillary: 130 mg/dL — ABNORMAL HIGH (ref 65–99)
Glucose-Capillary: 282 mg/dL — ABNORMAL HIGH (ref 65–99)

## 2018-01-24 LAB — CBC
HCT: 30.3 % — ABNORMAL LOW (ref 36.0–46.0)
Hemoglobin: 10.1 g/dL — ABNORMAL LOW (ref 12.0–15.0)
MCH: 32 pg (ref 26.0–34.0)
MCHC: 33.3 g/dL (ref 30.0–36.0)
MCV: 95.9 fL (ref 78.0–100.0)
PLATELETS: 41 10*3/uL — AB (ref 150–400)
RBC: 3.16 MIL/uL — ABNORMAL LOW (ref 3.87–5.11)
RDW: 14.5 % (ref 11.5–15.5)
WBC: 2.5 10*3/uL — ABNORMAL LOW (ref 4.0–10.5)

## 2018-01-24 LAB — COMPREHENSIVE METABOLIC PANEL
ALBUMIN: 2.9 g/dL — AB (ref 3.5–5.0)
ALK PHOS: 48 U/L (ref 38–126)
ALT: 195 U/L — AB (ref 14–54)
ANION GAP: 6 (ref 5–15)
AST: 330 U/L — AB (ref 15–41)
BILIRUBIN TOTAL: 0.9 mg/dL (ref 0.3–1.2)
BUN: 9 mg/dL (ref 6–20)
CALCIUM: 7.2 mg/dL — AB (ref 8.9–10.3)
CO2: 20 mmol/L — ABNORMAL LOW (ref 22–32)
CREATININE: 0.7 mg/dL (ref 0.44–1.00)
Chloride: 111 mmol/L (ref 101–111)
GFR calc Af Amer: >60 mL/min (ref >60)
GFR calc non Af Amer: >60 mL/min (ref >60)
Glucose, Bld: 162 mg/dL — ABNORMAL HIGH (ref 65–99)
Potassium: 4.2 mmol/L (ref 3.5–5.1)
Sodium: 137 mmol/L (ref 135–145)
TOTAL PROTEIN: 5.4 g/dL — AB (ref 6.5–8.1)

## 2018-01-24 LAB — HEPATITIS PANEL, ACUTE
Hep A IgM: NEGATIVE
Hep B C IgM: NEGATIVE
Hepatitis B Surface Ag: NEGATIVE

## 2018-01-24 LAB — MAGNESIUM: MAGNESIUM: 1.4 mg/dL — AB (ref 1.7–2.4)

## 2018-01-24 LAB — LACTIC ACID, PLASMA: LACTIC ACID, VENOUS: 1.1 mmol/L (ref 0.5–1.9)

## 2018-01-24 MED ORDER — MAGNESIUM SULFATE 2 GM/50ML IV SOLN
2.0000 g | Freq: Once | INTRAVENOUS | Status: AC
  Administered 2018-01-24: 2 g via INTRAVENOUS
  Filled 2018-01-24: qty 50

## 2018-01-24 MED ORDER — LEVOTHYROXINE SODIUM 100 MCG PO TABS
100.0000 ug | ORAL_TABLET | Freq: Every day | ORAL | Status: DC
  Administered 2018-01-25: 100 ug via ORAL
  Filled 2018-01-24: qty 1

## 2018-01-24 MED ORDER — CHLORDIAZEPOXIDE HCL 5 MG PO CAPS
10.0000 mg | ORAL_CAPSULE | Freq: Three times a day (TID) | ORAL | Status: DC
  Administered 2018-01-24 – 2018-01-25 (×4): 10 mg via ORAL
  Filled 2018-01-24 (×4): qty 2

## 2018-01-24 NOTE — Clinical Social Work Note (Signed)
Pt is a 62 year old female discussed in Progression today. MD asked that CSW provide pt with a list of [facility] treatment resources including AA meeting schedule. Pt sleeping soundly at the time of CSW visit today. Written resources left with pt's belongings. Will follow up tomorrow to meet with pt and answer any questions and offer support.

## 2018-01-24 NOTE — Progress Notes (Signed)
PROGRESS NOTE    Shannon Herrera  XLK:[id number] DOB: 1956/08/06 DOA: 01/22/2018 PCP: Patient, No Pcp Per   Brief Narrative:   Shannon Herrera is a 62 y.o. female with a history of alcoholism, hypertension.  Patient is unable to provide history as she is cognitively impaired.  Patient drinks alcohol on a daily basis and had some alcohol this morning as she was feeling quite shaky.  She has been acting quite unusual and has not been taking her diabetes medications.  She was noted to have alcohol withdrawal with possible DTs and withdrawal seizure as well as hyperglycemia for which she has been admitted to stepdown unit with Valium every 8 hours and Ativan as needed.  Her blood glucose has demonstrated improvement with use of insulin.  She is noted to have thrombocytopenia as well that is currently being monitored and is likely secondary to alcohol use and hepatic cirrhosis.  Assessment & Plan:   Principal Problem:   Alcohol withdrawal syndrome with complication, with unspecified complication (HCC) Active Problems:   Hyperglycemia   Essential hypertension   Elevated LFTs   Lactic acidosis   Hypothyroidism   Thrombocytopenia (HCC)   Hypomagnesemia   Acute renal injury (HCC)   1. Alcohol withdrawal with associated seizure activity.  No further withdrawal symptoms or seizure activity noted.  Will start Librium today for mild anxiety symptoms and transferred to floor. 2. Hyperglycemia with home medication noncompliance.  Much improved with use of insulin.  Continue to monitor as diet is started today. 3. Hypertension.  Currently stable. 4. Transaminitis.  This is likely secondary to alcoholic cirrhosis.  Acute hepatitis panel pending and liver ultrasound with cirrhosis noted.  This is stable. 5. AK I.  Resolved.  DC IV fluid. 6. Thrombocytopenia.  Maintain on SCDs.  No signs of acute bleeding.  This has been stable today, and is likely secondary to alcohol use and hepatic cirrhosis.  Will  transfuse platelets as needed. 7. Hypomagnesemia.  Currently undergoing replacement.  Will recheck in a.m. 8. Hypokalemia.  Resolved.  DC IV fluid and recheck a.m. labs. 9. Hypothyroidism with home medication noncompliance.  IV medication converted to levothyroxine 100 mcg p.o.  Patient is unsure of home dose and has not taken this in the last month or so.  This likely explains elevated TSH. 10. Lactic acidosis-resolved.  Likely secondary to prolonged seizure with no sign of infection.    DVT prophylaxis:SCDs Code Status: Full Family Communication: None at bedside Disposition Plan: Continue treatment of alcohol withdrawal symptoms with further workup of elevated LFTs pending.  Transfer to floor.   Consultants:   None  Procedures:   None  Antimicrobials:   None   Subjective: Patient seen and evaluated today.  She is much more alert and awake and otherwise feels well aside from some very mild anxiety.  She is hungry and would like something to eat.  Objective: Vitals:   01/24/18 0400 01/24/18 0459 01/24/18 0500 01/24/18 0748  BP: 117/70  119/73   Pulse: (!) 102  (!) 104 100  Resp: 20  (!) 24 20  Temp: 99.2 F (37.3 C)   99.1 F (37.3 C)  TempSrc: Axillary   Oral  SpO2: 99%  99% 100%  Weight:  58 kg (127 lb 13.9 oz)    Height:        Intake/Output Summary (Last 24 hours) at 01/24/2018 0848 Last data filed at 01/24/2018 0556 Gross per 24 hour  Intake 2892.5 ml  Output 750 ml  Net 2142.5 ml   Filed Weights   01/22/18 2330 01/24/18 0459  Weight: 53.5 kg (117 lb 15.1 oz) 58 kg (127 lb 13.9 oz)    Examination:  General exam: Sedated Respiratory system: Clear to auscultation. Respiratory effort normal. Cardiovascular system: S1 & S2 heard, RRR. No JVD, murmurs, rubs, gallops or clicks. No pedal edema. Gastrointestinal system: Abdomen is nondistended, soft and nontender. No organomegaly or masses felt. Normal bowel sounds heard. Central nervous system: Alert and  oriented. No focal neurological deficits. Skin: No rashes, lesions or ulcers    Data Reviewed: I have personally reviewed following labs and imaging studies  CBC: Recent Labs  Lab 01/22/18 2001 01/23/18 0359 01/24/18 0502  WBC 2.3* 2.0* 2.5*  NEUTROABS 1.1*  --   --   HGB 13.3 11.0* 10.1*  HCT 40.2 32.5* 30.3*  MCV 95.0 94.5 95.9  PLT 52* 36* 41*   Basic Metabolic Panel: Recent Labs  Lab 01/22/18 2001 01/22/18 2101 01/23/18 0514 01/23/18 1656 01/24/18 0502  NA 131*  --  140 139 137  K 3.7  --  3.1* 4.0 4.2  CL 90*  --  107 110 111  CO2 25  --  23 20* 20*  GLUCOSE 522*  --  238* 159* 162*  BUN 16  --  10 9 9   CREATININE 1.14*  --  0.58 0.71 0.70  CALCIUM 9.3  --  7.4* 6.9* 7.2*  MG  --  1.6*  --   --  1.4*  PHOS  --  2.6  --   --   --    GFR: Estimated Creatinine Clearance: 62.2 mL/min (by C-G formula based on SCr of 0.7 mg/dL). Liver Function Tests: Recent Labs  Lab 01/22/18 2001 01/23/18 0514 01/24/18 0502  AST 507* 283* 330*  ALT 317* 198* 195*  ALKPHOS 88 53 48  BILITOT 1.9* 1.2 0.9  PROT 8.8* 6.0* 5.4*  ALBUMIN 4.7 3.3* 2.9*   No results for input(s): LIPASE, AMYLASE in the last 168 hours. Recent Labs  Lab 01/22/18 2001  AMMONIA 44*   Coagulation Profile: No results for input(s): INR, PROTIME in the last 168 hours. Cardiac Enzymes: Recent Labs  Lab 01/22/18 2001  CKTOTAL 36*   BNP (last 3 results) No results for input(s): PROBNP in the last 8760 hours. HbA1C: Recent Labs    01/23/18 0053  HGBA1C 8.1*   CBG: Recent Labs  Lab 01/23/18 1619 01/23/18 1939 01/23/18 2121 01/24/18 0345 01/24/18 0747  GLUCAP 141* 169* 238* 166* 191*   Lipid Profile: No results for input(s): CHOL, HDL, LDLCALC, TRIG, CHOLHDL, LDLDIRECT in the last 72 hours. Thyroid Function Tests: Recent Labs    01/22/18 2001  TSH 11.706*   Anemia Panel: No results for input(s): VITAMINB12, FOLATE, FERRITIN, TIBC, IRON, RETICCTPCT in the last 72 hours. Sepsis  Labs: Recent Labs  Lab 01/23/18 0053 01/23/18 0359 01/23/18 0806 01/24/18 0502  LATICACIDVEN 1.5 2.4* 1.9 1.1    Recent Results (from the past 240 hour(s))  Culture, blood (Routine X 2) w Reflex to ID Panel     Status: None (Preliminary result)   Collection Time: 01/22/18  9:13 PM  Result Value Ref Range Status   Specimen Description LEFT ANTECUBITAL  Final   Special Requests   Final    BOTTLES DRAWN AEROBIC ONLY Blood Culture results may not be optimal due to an inadequate volume of blood received in culture bottles   Culture   Final    NO GROWTH 2 DAYS  Performed at [facility], [address] [place], [place] [address]    Report Status PENDING  Incomplete  Culture, blood (Routine X 2) w Reflex to ID Panel     Status: None (Preliminary result)   Collection Time: 01/22/18  9:20 PM  Result Value Ref Range Status   Specimen Description BLOOD RIGHT ARM  Final   Special Requests   Final    BOTTLES DRAWN AEROBIC ONLY Blood Culture results may not be optimal due to an inadequate volume of blood received in culture bottles   Culture   Final    NO GROWTH 2 DAYS Performed at [facility], [address] [place], [place] [address]    Report Status PENDING  Incomplete  MRSA PCR Screening     Status: None   Collection Time: 01/22/18 11:30 PM  Result Value Ref Range Status   MRSA by PCR NEGATIVE NEGATIVE Final    Comment:        The GeneXpert MRSA Assay (FDA approved for NASAL specimens only), is one component of a comprehensive MRSA colonization surveillance program. It is not intended to diagnose MRSA infection nor to guide or monitor treatment for MRSA infections. Performed at [facility], [address] [place], [place] [address]          Radiology Studies: Ct Head Wo Contrast  Result Date: 01/22/2018 CLINICAL DATA:  Altered mental status EXAM: CT HEAD WITHOUT CONTRAST TECHNIQUE: Contiguous axial images were obtained from the base of the skull through the vertex  without intravenous contrast. COMPARISON:  None. FINDINGS: Brain: The ventricles are normal in size and configuration. There is moderate parietal lobe atrophy bilaterally. There is no intracranial mass, hemorrhage, extra-axial fluid collection, or midline shift. There is patchy small vessel disease in the centra semiovale bilaterally. Elsewhere gray-white compartments appear unremarkable. No acute infarct evident. Vascular: No hyperdense vessel. There is calcification in each cavernous carotid artery. Skull: Bony calvarium appears intact. Sinuses/Orbits: There is mucosal thickening in several ethmoid air cells. Other paranasal sinuses are clear. Orbits appear symmetric bilaterally. Other: Mastoid air cells are clear. IMPRESSION: Ventricles normal in size and configuration. There is bilateral parietal lobe atrophy. There is patchy small vessel disease in the centra semiovale bilaterally. No evident acute infarct. No mass or hemorrhage. There are foci of arterial vascular calcification. There is mucosal thickening in several ethmoid air cells. Electronically Signed   By: Bretta Bang III M.D.   On: 01/22/2018 20:35   US Abdomen Limited Ruq  Result Date: 01/23/2018 CLINICAL DATA:  Elevated LFTs.  Evaluate for alcoholic cirrhosis. EXAM: ULTRASOUND ABDOMEN LIMITED RIGHT UPPER QUADRANT COMPARISON:  04/23/2005.  Abdominal CT 04/21/2005 FINDINGS: Gallbladder: No gallstones or wall thickening visualized. No sonographic Murphy sign noted by sonographer. Common bile duct: Diameter: 0.3 cm Liver: Slightly increased echogenicity in the liver. Anterior right hepatic lobe has a nodular contour. No focal liver lesion. Mild nodularity along the anterior left hepatic lobe. No perihepatic ascites. Portal vein is patent on color Doppler imaging with normal direction of blood flow towards the liver. IMPRESSION: Liver has a nodular contour and slightly increased echogenicity. Findings are suggestive for cirrhosis. Main portal  vein is patent with normal direction of flow. Electronically Signed   By: Richarda Overlie M.D.   On: 01/23/2018 11:24        Scheduled Meds: . chlordiazePOXIDE  10 mg Oral TID  . folic acid  1 mg Intravenous Daily  . insulin aspart  0-15 Units Subcutaneous TID WC  .  insulin aspart  0-5 Units Subcutaneous QHS  . insulin glargine  10 Units Subcutaneous QHS  . levothyroxine  100 mcg Oral QAC breakfast  . thiamine  100 mg Oral Daily   Or  . thiamine  100 mg Intravenous Daily  . thiamine  100 mg Intravenous Daily   Continuous Infusions: . magnesium sulfate 1 - 4 g bolus IVPB 2 g (01/24/18 0807)     LOS: 2 days    Time spent: 30 minutes    Pratik Hoover Brunette, DO [facility] Pager [phone]  If 7PM-7AM, please contact night-coverage [email or web address] Password [facility] 01/24/2018, 8:48 AM

## 2018-01-25 LAB — COMPREHENSIVE METABOLIC PANEL
ALT: 171 U/L — ABNORMAL HIGH (ref 14–54)
AST: 234 U/L — ABNORMAL HIGH (ref 15–41)
Albumin: 2.9 g/dL — ABNORMAL LOW (ref 3.5–5.0)
Alkaline Phosphatase: 60 U/L (ref 38–126)
Anion gap: 10 (ref 5–15)
BILIRUBIN TOTAL: 0.5 mg/dL (ref 0.3–1.2)
BUN: 10 mg/dL (ref 6–20)
CHLORIDE: 107 mmol/L (ref 101–111)
CO2: 19 mmol/L — ABNORMAL LOW (ref 22–32)
Calcium: 8.2 mg/dL — ABNORMAL LOW (ref 8.9–10.3)
Creatinine, Ser: 0.68 mg/dL (ref 0.44–1.00)
GFR calc non Af Amer: >60 mL/min (ref >60)
Glucose, Bld: 163 mg/dL — ABNORMAL HIGH (ref 65–99)
POTASSIUM: 3.6 mmol/L (ref 3.5–5.1)
Sodium: 136 mmol/L (ref 135–145)
TOTAL PROTEIN: 5.6 g/dL — AB (ref 6.5–8.1)

## 2018-01-25 LAB — CBC
HEMATOCRIT: 28.2 % — AB (ref 36.0–46.0)
Hemoglobin: 9.5 g/dL — ABNORMAL LOW (ref 12.0–15.0)
MCH: 32.1 pg (ref 26.0–34.0)
MCHC: 33.7 g/dL (ref 30.0–36.0)
MCV: 95.3 fL (ref 78.0–100.0)
PLATELETS: 39 10*3/uL — AB (ref 150–400)
RBC: 2.96 MIL/uL — AB (ref 3.87–5.11)
RDW: 14.4 % (ref 11.5–15.5)
WBC: 2.2 10*3/uL — AB (ref 4.0–10.5)

## 2018-01-25 LAB — GLUCOSE, CAPILLARY
GLUCOSE-CAPILLARY: 134 mg/dL — AB (ref 65–99)
GLUCOSE-CAPILLARY: 164 mg/dL — AB (ref 65–99)
GLUCOSE-CAPILLARY: 242 mg/dL — AB (ref 65–99)

## 2018-01-25 LAB — MAGNESIUM: Magnesium: 1.4 mg/dL — ABNORMAL LOW (ref 1.7–2.4)

## 2018-01-25 MED ORDER — MAGNESIUM SULFATE 2 GM/50ML IV SOLN
2.0000 g | Freq: Once | INTRAVENOUS | Status: AC
  Administered 2018-01-25: 2 g via INTRAVENOUS
  Filled 2018-01-25: qty 50

## 2018-01-25 MED ORDER — LEVOTHYROXINE SODIUM 100 MCG PO TABS: 100.0000 ug | ORAL_TABLET | Freq: Every day | ORAL | 3 refills | Status: DC

## 2018-01-25 MED ORDER — CHLORDIAZEPOXIDE HCL 10 MG PO CAPS: 10.0000 mg | ORAL_CAPSULE | Freq: Three times a day (TID) | ORAL | 0 refills | Status: DC

## 2018-01-25 MED ORDER — SERTRALINE HCL 25 MG PO TABS: 25.0000 mg | ORAL_TABLET | Freq: Every day | ORAL | 2 refills | Status: DC

## 2018-01-25 MED ORDER — FOLIC ACID 1 MG PO TABS
1.0000 mg | ORAL_TABLET | Freq: Every day | ORAL | Status: DC
  Administered 2018-01-25: 1 mg via ORAL
  Filled 2018-01-25: qty 1

## 2018-01-25 NOTE — Progress Notes (Signed)
Patient and family state understanding of discharge instructions 

## 2018-01-25 NOTE — Care Management Note (Signed)
Case Management Note  Patient Details  Name: Shannon Herrera MRN: [id number] Date of Birth: 27-Mar-1956  Subjective/Objective:     Admitted with ETOH withdrawal. Pt is uninsured, unemployed. Has no PCP.                Action/Plan: CM provided MATCH for med assistance. [facility]   Expected Discharge Date:  01/25/18               Expected Discharge Plan:  Home/Self Care  In-House Referral:  Financial Counselor, Clinical Social Work  Discharge planning Services  CM Consult, MATCH Program, Indigent Health Clinic  Post Acute Care Choice:  NA Choice offered to:  NA  Status of Service:  Completed, signed off  Malcolm MetroChildress, Alonzo Owczarzak Demske, RN 01/25/2018, 9:29 AM

## 2018-01-25 NOTE — Discharge Summary (Signed)
Physician Discharge Summary  Shannon Herrera ZDG:[id number] DOB: 1956-05-28 DOA: 01/22/2018  PCP: Patient, No Pcp Per  Admit date: 01/22/2018  Discharge date: 01/25/2018  Admitted From:Home  Disposition:  Home  Recommendations for Outpatient Follow-up:  1. Follow up with new PCP in 4 weeks  Home Health:None  Equipment/Devices:None  Discharge Condition:Stable  CODE STATUS: Full  Diet recommendation: Heart Healthy  Brief/Interim Summary:  Shannon E Grieris a 62 y.o.femalewith a history of alcoholism, hypertension. Patient is unable to provide history as she is cognitively impaired.  Patient drinks alcohol on a daily basis and had some alcohol this morning as she was feeling quite shaky.  She has been acting quite unusual and has not been taking her diabetes medications.  She was noted to have alcohol withdrawal with possible DTs and withdrawal seizure as well as hyperglycemia for which she has been admitted to stepdown unit with Valium every 8 hours and Ativan as needed.  Her blood glucose has demonstrated improvement with use of insulin while inpatient and she will be resumed on her home metformin.  She is noted to have thrombocytopenia as well that has remained stable and is attributed to her alcoholic cirrhosis.  She is otherwise feeling much better and is stable on her Librium.  She agrees to go to [facility] and establish with a new primary care physician on discharge.   Discharge Diagnoses:  Principal Problem:   Alcohol withdrawal syndrome with complication, with unspecified complication (HCC) Active Problems:   Hyperglycemia   Essential hypertension   Elevated LFTs   Lactic acidosis   Hypothyroidism   Thrombocytopenia (HCC)   Hypomagnesemia   Acute renal injury (HCC)  1. Alcohol withdrawal with associated seizure activity.  No further withdrawal symptoms or seizure activity noted.    Will continue on Librium for 1 month as she follows AA meetings.  Establish with new  PCP.  Started on Zoloft for depression as well which patient was previously taking. 2. Hyperglycemia with home medication noncompliance.  Much improved with use of insulin.  Restart home metformin. 3. Hypertension.  Currently stable. 4. Transaminitis.  This is likely secondary to alcoholic cirrhosis.  Acute hepatitis panel negative and liver ultrasound with cirrhosis noted.  This is stable. 5. AK I.  Resolved.    Resume home medications as previous. 6. Thrombocytopenia.    Currently stable with no signs of acute bleeding and no need for platelet transfusion.  Monitor with repeat CBC in the outpatient setting once established with PCP. 7. Hypomagnesemia.  Repleted. 8. Hypokalemia.  Resolved.   9. Hypothyroidism with home medication noncompliance.    TSH elevation noted while inpatient.  Patient started on levothyroxine 100 mcg daily.  She has been tolerating this dose well.  Continue to monitor in outpatient setting. 10. Lactic acidosis-resolved.  Likely secondary to prolonged seizure with no sign of infection.    Discharge Instructions  Discharge Instructions    Diet - low sodium heart healthy   Complete by:  As directed    Increase activity slowly   Complete by:  As directed      Allergies as of 01/25/2018   No Known Allergies     Medication List    STOP taking these medications   lovastatin 20 MG tablet Commonly known as:  MEVACOR     TAKE these medications   chlordiazePOXIDE 10 MG capsule Commonly known as:  LIBRIUM Take 1 capsule (10 mg total) by mouth 3 (three) times daily.   levothyroxine 100 MCG  tablet Commonly known as:  SYNTHROID, LEVOTHROID Take 1 tablet (100 mcg total) by mouth daily before breakfast.   lisinopril-hydrochlorothiazide 20-25 MG tablet Commonly known as:  PRINZIDE,ZESTORETIC Take 1 tablet by mouth daily.   metFORMIN 1000 MG tablet Commonly known as:  GLUCOPHAGE Take 1,000 mg by mouth 2 (two) times daily with a meal.   sertraline 25 MG  tablet Commonly known as:  ZOLOFT Take 1 tablet (25 mg total) by mouth daily.   traZODone 50 MG tablet Commonly known as:  DESYREL Take 50 mg by mouth at bedtime.      Follow-up Information    pcp Follow up in 4 week(s).          No Known Allergies  Consultations:  None   Procedures/Studies: Ct Head Wo Contrast  Result Date: 01/22/2018 CLINICAL DATA:  Altered mental status EXAM: CT HEAD WITHOUT CONTRAST TECHNIQUE: Contiguous axial images were obtained from the base of the skull through the vertex without intravenous contrast. COMPARISON:  None. FINDINGS: Brain: The ventricles are normal in size and configuration. There is moderate parietal lobe atrophy bilaterally. There is no intracranial mass, hemorrhage, extra-axial fluid collection, or midline shift. There is patchy small vessel disease in the centra semiovale bilaterally. Elsewhere gray-white compartments appear unremarkable. No acute infarct evident. Vascular: No hyperdense vessel. There is calcification in each cavernous carotid artery. Skull: Bony calvarium appears intact. Sinuses/Orbits: There is mucosal thickening in several ethmoid air cells. Other paranasal sinuses are clear. Orbits appear symmetric bilaterally. Other: Mastoid air cells are clear. IMPRESSION: Ventricles normal in size and configuration. There is bilateral parietal lobe atrophy. There is patchy small vessel disease in the centra semiovale bilaterally. No evident acute infarct. No mass or hemorrhage. There are foci of arterial vascular calcification. There is mucosal thickening in several ethmoid air cells. Electronically Signed   By: Bretta Bang III M.D.   On: 01/22/2018 20:35   US Abdomen Limited Ruq  Result Date: 01/23/2018 CLINICAL DATA:  Elevated LFTs.  Evaluate for alcoholic cirrhosis. EXAM: ULTRASOUND ABDOMEN LIMITED RIGHT UPPER QUADRANT COMPARISON:  04/23/2005.  Abdominal CT 04/21/2005 FINDINGS: Gallbladder: No gallstones or wall thickening  visualized. No sonographic Murphy sign noted by sonographer. Common bile duct: Diameter: 0.3 cm Liver: Slightly increased echogenicity in the liver. Anterior right hepatic lobe has a nodular contour. No focal liver lesion. Mild nodularity along the anterior left hepatic lobe. No perihepatic ascites. Portal vein is patent on color Doppler imaging with normal direction of blood flow towards the liver. IMPRESSION: Liver has a nodular contour and slightly increased echogenicity. Findings are suggestive for cirrhosis. Main portal vein is patent with normal direction of flow. Electronically Signed   By: Richarda Overlie M.D.   On: 01/23/2018 11:24   Discharge Exam: Vitals:   01/24/18 2134 01/25/18 0429  BP: 134/68 (!) 150/85  Pulse: (!) 106 (!) 102  Resp: 18 18  Temp: 98.6 F (37 C) 98.7 F (37.1 C)  SpO2: 100% 100%   Vitals:   01/24/18 1210 01/24/18 1609 01/24/18 2134 01/25/18 0429  BP:  (!) 141/86 134/68 (!) 150/85  Pulse:  (!) 101 (!) 106 (!) 102  Resp:  20 18 18   Temp:  97.7 F (36.5 C) 98.6 F (37 C) 98.7 F (37.1 C)  TempSrc:  Oral Oral Oral  SpO2: 98% 100% 100% 100%  Weight:      Height:        General: Pt is alert, awake, not in acute distress Cardiovascular: RRR, S1/S2 +, no  rubs, no gallops Respiratory: CTA bilaterally, no wheezing, no rhonchi Abdominal: Soft, NT, ND, bowel sounds + Extremities: no edema, no cyanosis    The results of significant diagnostics from this hospitalization (including imaging, microbiology, ancillary and laboratory) are listed below for reference.     Microbiology: Recent Results (from the past 240 hour(s))  Culture, blood (Routine X 2) w Reflex to ID Panel     Status: None (Preliminary result)   Collection Time: 01/22/18  9:13 PM  Result Value Ref Range Status   Specimen Description LEFT ANTECUBITAL  Final   Special Requests   Final    BOTTLES DRAWN AEROBIC ONLY Blood Culture results may not be optimal due to an inadequate volume of blood  received in culture bottles   Culture   Final    NO GROWTH 3 DAYS Performed at [facility], [address] [place], [place] [address]    Report Status PENDING  Incomplete  Culture, blood (Routine X 2) w Reflex to ID Panel     Status: None (Preliminary result)   Collection Time: 01/22/18  9:20 PM  Result Value Ref Range Status   Specimen Description BLOOD RIGHT ARM  Final   Special Requests   Final    BOTTLES DRAWN AEROBIC ONLY Blood Culture results may not be optimal due to an inadequate volume of blood received in culture bottles   Culture   Final    NO GROWTH 3 DAYS Performed at [facility], [address] [place], [place] [address]    Report Status PENDING  Incomplete  MRSA PCR Screening     Status: None   Collection Time: 01/22/18 11:30 PM  Result Value Ref Range Status   MRSA by PCR NEGATIVE NEGATIVE Final    Comment:        The GeneXpert MRSA Assay (FDA approved for NASAL specimens only), is one component of a comprehensive MRSA colonization surveillance program. It is not intended to diagnose MRSA infection nor to guide or monitor treatment for MRSA infections. Performed at [facility], [address] [place], [place] [address]      Labs: BNP (last 3 results) No results for input(s): BNP in the last 8760 hours. Basic Metabolic Panel: Recent Labs  Lab 01/22/18 2001 01/22/18 2101 01/23/18 0514 01/23/18 1656 01/24/18 0502 01/25/18 0517  NA 131*  --  140 139 137 136  K 3.7  --  3.1* 4.0 4.2 3.6  CL 90*  --  107 110 111 107  CO2 25  --  23 20* 20* 19*  GLUCOSE 522*  --  238* 159* 162* 163*  BUN 16  --  10 9 9 10   CREATININE 1.14*  --  0.58 0.71 0.70 0.68  CALCIUM 9.3  --  7.4* 6.9* 7.2* 8.2*  MG  --  1.6*  --   --  1.4* 1.4*  PHOS  --  2.6  --   --   --   --    Liver Function Tests: Recent Labs  Lab 01/22/18 2001 01/23/18 0514 01/24/18 0502 01/25/18 0517  AST 507* 283* 330* 234*  ALT 317* 198* 195* 171*  ALKPHOS 88 53 48 60  BILITOT 1.9* 1.2  0.9 0.5  PROT 8.8* 6.0* 5.4* 5.6*  ALBUMIN 4.7 3.3* 2.9* 2.9*   No results for input(s): LIPASE, AMYLASE in the last 168 hours. Recent Labs  Lab 01/22/18 2001  AMMONIA 44*   CBC: Recent Labs  Lab 01/22/18 2001 01/23/18 9147 01/24/18 0502 01/25/18 8295  WBC 2.3* 2.0* 2.5* 2.2*  NEUTROABS 1.1*  --   --   --   HGB 13.3 11.0* 10.1* 9.5*  HCT 40.2 32.5* 30.3* 28.2*  MCV 95.0 94.5 95.9 95.3  PLT 52* 36* 41* 39*   Cardiac Enzymes: Recent Labs  Lab 01/22/18 2001  CKTOTAL 36*   BNP: Invalid input(s): POCBNP CBG: Recent Labs  Lab 01/24/18 1642 01/24/18 2111 01/25/18 0021 01/25/18 0430 01/25/18 0748  GLUCAP 130* 282* 242* 164* 134*   D-Dimer No results for input(s): DDIMER in the last 72 hours. Hgb A1c Recent Labs    01/23/18 0053  HGBA1C 8.1*   Lipid Profile No results for input(s): CHOL, HDL, LDLCALC, TRIG, CHOLHDL, LDLDIRECT in the last 72 hours. Thyroid function studies Recent Labs    01/22/18 2001  TSH 11.706*   Anemia work up No results for input(s): VITAMINB12, FOLATE, FERRITIN, TIBC, IRON, RETICCTPCT in the last 72 hours. Urinalysis    Component Value Date/Time   COLORURINE YELLOW (A) 01/23/2018 0428   APPEARANCEUR CLEAR (A) 01/23/2018 0428   LABSPEC 1.009 01/23/2018 0428   PHURINE 6.0 01/23/2018 0428   GLUCOSEU >500 (A) 01/23/2018 0428   HGBUR SMALL (A) 01/23/2018 0428   BILIRUBINUR NEGATIVE 01/23/2018 0428   KETONESUR NEGATIVE 01/23/2018 0428   PROTEINUR NEGATIVE 01/23/2018 0428   NITRITE NEGATIVE 01/23/2018 0428   LEUKOCYTESUR NEGATIVE 01/23/2018 0428   Sepsis Labs Invalid input(s): PROCALCITONIN,  WBC,  LACTICIDVEN Microbiology Recent Results (from the past 240 hour(s))  Culture, blood (Routine X 2) w Reflex to ID Panel     Status: None (Preliminary result)   Collection Time: 01/22/18  9:13 PM  Result Value Ref Range Status   Specimen Description LEFT ANTECUBITAL  Final   Special Requests   Final    BOTTLES DRAWN AEROBIC ONLY Blood  Culture results may not be optimal due to an inadequate volume of blood received in culture bottles   Culture   Final    NO GROWTH 3 DAYS Performed at [facility], [address] [place], [place] [address]    Report Status PENDING  Incomplete  Culture, blood (Routine X 2) w Reflex to ID Panel     Status: None (Preliminary result)   Collection Time: 01/22/18  9:20 PM  Result Value Ref Range Status   Specimen Description BLOOD RIGHT ARM  Final   Special Requests   Final    BOTTLES DRAWN AEROBIC ONLY Blood Culture results may not be optimal due to an inadequate volume of blood received in culture bottles   Culture   Final    NO GROWTH 3 DAYS Performed at [facility], [address] [place], [place] [address]    Report Status PENDING  Incomplete  MRSA PCR Screening     Status: None   Collection Time: 01/22/18 11:30 PM  Result Value Ref Range Status   MRSA by PCR NEGATIVE NEGATIVE Final    Comment:        The GeneXpert MRSA Assay (FDA approved for NASAL specimens only), is one component of a comprehensive MRSA colonization surveillance program. It is not intended to diagnose MRSA infection nor to guide or monitor treatment for MRSA infections. Performed at [facility], [address] [place], [place] [address]      Time coordinating discharge: 35 minutes  SIGNED:   Erick Blinks, DO [facility] 01/25/2018, 9:08 AM Pager [phone]  If 7PM-7AM, please contact night-coverage [email or web address] Password [email or web address]

## 2018-01-25 NOTE — Clinical Social Work Note (Signed)
Clinical Social Work Assessment  Patient Details  Name: Shannon Herrera MRN: [id number] Date of Birth: 01-04-1956  Date of referral:  01/24/18               Reason for consult:  Substance Use/ETOH Abuse                Permission sought to share information with:    Permission granted to share information::     Name::        Agency::     Relationship::     Contact Information:     Housing/Transportation Living arrangements for the past 2 months:  Single Family Home Source of Information:  Patient Patient Interpreter Needed:  None Criminal Activity/Legal Involvement Pertinent to Current Situation/Hospitalization:  No - Comment as needed Significant Relationships:  Adult Children Lives with:  Self Do you feel safe going back to the place where you live?  Yes Need for family participation in patient care:  No (Coment)  Care giving concerns: No care giving concerns identified.   Social Worker assessment / plan:  Pt is a 62 year old female referred to CSW for ETOH treatment resources. Attempted to meet with pt yesterday though she was sleeping and LCSW did not awaken her. Was able to meet with pt this AM. Per pt, she is discharging home today. She states that she lives alone and is independent in ADLs. Pt has some family support in the area. She states that her daughter will be taking her home. Reviewed written information on ETOH treatment resources with pt. Pt states she has done AA in [place] in the past and did not find it to be helpful. She states that the women in the groups are not supportive. Pt did state that she would be interested in following up with [facility]. LCSW encouraged pt to contact them even before she leaves the hospital today. Pt stated she would do this. Discussed pt's previous successes with treatment and periods of sobriety. Encouragement and emotional support provided.  Employment status:  Unemployed Health and safety inspectornsurance information:  Self Pay (Medicaid Pending) PT  Recommendations:  Not assessed at this time Information / Referral to community resources:  Outpatient Substance Abuse Treatment Options  Patient/Family's Response to care:  Pt accepting of care.  Patient/Family's Understanding of and Emotional Response to Diagnosis, Current Treatment, and Prognosis: Pt appears to have a good understanding of diagnosis and treatment recommendations. Pt states she would like to stop drinking and that she plans to follow up with [facility]. No emotional distress identified.  Emotional Assessment Appearance:  Appears younger than stated age Attitude/Demeanor/Rapport:  Engaged Affect (typically observed):  Accepting, Pleasant Orientation:  Oriented to Self, Oriented to Place, Oriented to  Time, Oriented to Situation Alcohol / Substance use:  Alcohol Use Psych involvement (Current and /or in the community):  No (Comment)  Discharge Needs  Concerns to be addressed:  Substance Abuse Concerns Readmission within the last 30 days:  No Current discharge risk:  Substance Abuse Barriers to Discharge:  No Barriers Identified   Elliot GaultKathleen Sequoia Mincey, LCSW 01/25/2018, 10:21 AM

## 2018-01-27 LAB — CULTURE, BLOOD (ROUTINE X 2)
CULTURE: NO GROWTH
CULTURE: NO GROWTH

## 2018-05-01 ENCOUNTER — Emergency Department (HOSPITAL_COMMUNITY)
Admission: EM | Admit: 2018-05-01 | Discharge: 2018-05-01 | Disposition: A | Discharge status: Home / Self Care | Payer: Self-pay | Attending: Emergency Medicine | Admitting: Emergency Medicine

## 2018-05-01 ENCOUNTER — Other Ambulatory Visit: Payer: Self-pay

## 2018-05-01 ENCOUNTER — Emergency Department (HOSPITAL_COMMUNITY): Payer: Self-pay

## 2018-05-01 ENCOUNTER — Encounter (HOSPITAL_COMMUNITY): Payer: Self-pay | Admitting: *Deleted

## 2018-05-01 DIAGNOSIS — E876 Hypokalemia: Secondary | ICD-10-CM | POA: Insufficient documentation

## 2018-05-01 DIAGNOSIS — D696 Thrombocytopenia, unspecified: Secondary | ICD-10-CM | POA: Insufficient documentation

## 2018-05-01 DIAGNOSIS — R945 Abnormal results of liver function studies: Secondary | ICD-10-CM

## 2018-05-01 DIAGNOSIS — E119 Type 2 diabetes mellitus without complications: Secondary | ICD-10-CM | POA: Insufficient documentation

## 2018-05-01 DIAGNOSIS — Z7984 Long term (current) use of oral hypoglycemic drugs: Secondary | ICD-10-CM | POA: Insufficient documentation

## 2018-05-01 DIAGNOSIS — E039 Hypothyroidism, unspecified: Secondary | ICD-10-CM | POA: Insufficient documentation

## 2018-05-01 DIAGNOSIS — Z79899 Other long term (current) drug therapy: Secondary | ICD-10-CM | POA: Insufficient documentation

## 2018-05-01 DIAGNOSIS — R7989 Other specified abnormal findings of blood chemistry: Secondary | ICD-10-CM

## 2018-05-01 DIAGNOSIS — I10 Essential (primary) hypertension: Secondary | ICD-10-CM | POA: Insufficient documentation

## 2018-05-01 DIAGNOSIS — R17 Unspecified jaundice: Secondary | ICD-10-CM

## 2018-05-01 HISTORY — DX: Essential (primary) hypertension: I10

## 2018-05-01 HISTORY — DX: Hypothyroidism, unspecified: E03.9

## 2018-05-01 HISTORY — DX: Type 2 diabetes mellitus without complications: E11.9

## 2018-05-01 LAB — URINALYSIS, ROUTINE W REFLEX MICROSCOPIC
BILIRUBIN URINE: NEGATIVE
KETONES UR: 5 mg/dL — AB
Nitrite: NEGATIVE
PH: 6 (ref 5.0–8.0)
Protein, ur: 100 mg/dL — AB
SPECIFIC GRAVITY, URINE: 1.025 (ref 1.005–1.030)

## 2018-05-01 LAB — CBC
HEMATOCRIT: 35.6 % — AB (ref 36.0–46.0)
HEMOGLOBIN: 12 g/dL (ref 12.0–15.0)
MCH: 30.5 pg (ref 26.0–34.0)
MCHC: 33.7 g/dL (ref 30.0–36.0)
MCV: 90.6 fL (ref 78.0–100.0)
Platelets: 30 10*3/uL — ABNORMAL LOW (ref 150–400)
RBC: 3.93 MIL/uL (ref 3.87–5.11)
RDW: 13.8 % (ref 11.5–15.5)
WBC: 2.3 10*3/uL — AB (ref 4.0–10.5)

## 2018-05-01 LAB — COMPREHENSIVE METABOLIC PANEL
ALBUMIN: 4.2 g/dL (ref 3.5–5.0)
ALT: 234 U/L — ABNORMAL HIGH (ref 0–44)
ANION GAP: 13 (ref 5–15)
AST: 190 U/L — AB (ref 15–41)
Alkaline Phosphatase: 113 U/L (ref 38–126)
BUN: 22 mg/dL (ref 8–23)
CHLORIDE: 89 mmol/L — AB (ref 98–111)
CO2: 29 mmol/L (ref 22–32)
Calcium: 10 mg/dL (ref 8.9–10.3)
Creatinine, Ser: 1.19 mg/dL — ABNORMAL HIGH (ref 0.44–1.00)
GFR calc Af Amer: 55 mL/min — ABNORMAL LOW (ref >60)
GFR, EST NON AFRICAN AMERICAN: 48 mL/min — AB (ref >60)
GLUCOSE: 488 mg/dL — AB (ref 70–99)
POTASSIUM: 2.9 mmol/L — AB (ref 3.5–5.1)
Sodium: 131 mmol/L — ABNORMAL LOW (ref 135–145)
Total Bilirubin: 2.9 mg/dL — ABNORMAL HIGH (ref 0.3–1.2)
Total Protein: 8 g/dL (ref 6.5–8.1)

## 2018-05-01 LAB — PROTIME-INR
INR: 1.13
Prothrombin Time: 14.5 seconds (ref 11.4–15.2)

## 2018-05-01 LAB — APTT: aPTT: 26 seconds (ref 24–36)

## 2018-05-01 LAB — MAGNESIUM: MAGNESIUM: 1.3 mg/dL — AB (ref 1.7–2.4)

## 2018-05-01 LAB — CBG MONITORING, ED: GLUCOSE-CAPILLARY: 390 mg/dL — AB (ref 70–99)

## 2018-05-01 LAB — LIPASE, BLOOD: LIPASE: 134 U/L — AB (ref 11–51)

## 2018-05-01 MED ORDER — VITAMIN B-1 100 MG PO TABS
100.0000 mg | ORAL_TABLET | Freq: Once | ORAL | Status: AC
  Administered 2018-05-01: 100 mg via ORAL
  Filled 2018-05-01: qty 1

## 2018-05-01 MED ORDER — POTASSIUM CHLORIDE CRYS ER 20 MEQ PO TBCR: 20.0000 meq | EXTENDED_RELEASE_TABLET | Freq: Every day | ORAL | 0 refills | Status: DC

## 2018-05-01 MED ORDER — IOHEXOL 300 MG/ML  SOLN
100.0000 mL | Freq: Once | INTRAMUSCULAR | Status: AC | PRN
  Administered 2018-05-01: 80 mL via INTRAVENOUS

## 2018-05-01 MED ORDER — INSULIN ASPART 100 UNIT/ML ~~LOC~~ SOLN
10.0000 [IU] | Freq: Once | SUBCUTANEOUS | Status: AC
  Administered 2018-05-01: 10 [IU] via SUBCUTANEOUS
  Filled 2018-05-01: qty 1

## 2018-05-01 MED ORDER — SODIUM CHLORIDE 0.9 % IV BOLUS: 1000.0000 mL | Freq: Once | INTRAVENOUS | Status: DC

## 2018-05-01 MED ORDER — LISINOPRIL-HYDROCHLOROTHIAZIDE 20-25 MG PO TABS: 1.0000 | ORAL_TABLET | Freq: Every day | ORAL | 3 refills | Status: DC

## 2018-05-01 MED ORDER — SODIUM CHLORIDE 0.9 % IV BOLUS
1000.0000 mL | Freq: Once | INTRAVENOUS | Status: AC
  Administered 2018-05-01: 1000 mL via INTRAVENOUS

## 2018-05-01 MED ORDER — POTASSIUM CHLORIDE CRYS ER 20 MEQ PO TBCR
40.0000 meq | EXTENDED_RELEASE_TABLET | Freq: Once | ORAL | Status: AC
  Administered 2018-05-01: 40 meq via ORAL
  Filled 2018-05-01: qty 2

## 2018-05-01 MED ORDER — CHLORDIAZEPOXIDE HCL 10 MG PO CAPS: 10.0000 mg | ORAL_CAPSULE | Freq: Three times a day (TID) | ORAL | 0 refills | Status: DC

## 2018-05-01 MED ORDER — FOLIC ACID 1 MG PO TABS
1.0000 mg | ORAL_TABLET | Freq: Once | ORAL | Status: AC
  Administered 2018-05-01: 1 mg via ORAL
  Filled 2018-05-01: qty 1

## 2018-05-01 MED ORDER — MAGNESIUM SULFATE 2 GM/50ML IV SOLN
2.0000 g | Freq: Once | INTRAVENOUS | Status: AC
  Administered 2018-05-01: 2 g via INTRAVENOUS
  Filled 2018-05-01: qty 50

## 2018-05-01 MED ORDER — ONDANSETRON HCL 4 MG/2ML IJ SOLN
4.0000 mg | Freq: Once | INTRAMUSCULAR | Status: AC
  Administered 2018-05-01: 4 mg via INTRAVENOUS
  Filled 2018-05-01: qty 2

## 2018-05-01 MED ORDER — LEVOTHYROXINE SODIUM 100 MCG PO TABS: 100.0000 ug | ORAL_TABLET | Freq: Every day | ORAL | 3 refills | Status: DC

## 2018-05-01 MED ORDER — SERTRALINE HCL 25 MG PO TABS: 25.0000 mg | ORAL_TABLET | Freq: Every day | ORAL | 0 refills | Status: DC

## 2018-05-01 MED ORDER — METFORMIN HCL 1000 MG PO TABS: 1000.0000 mg | ORAL_TABLET | Freq: Two times a day (BID) | ORAL | 3 refills | Status: DC

## 2018-05-01 MED ORDER — ONDANSETRON 4 MG PO TBDP: 4.0000 mg | ORAL_TABLET | Freq: Three times a day (TID) | ORAL | 0 refills | Status: DC | PRN

## 2018-05-01 NOTE — ED Provider Notes (Addendum)
[facility] EMERGENCY DEPARTMENT Provider Note   CSN: [id number] Arrival date & time: 05/01/18  1346     History   Chief Complaint Chief Complaint  Patient presents with  . Emesis    HPI Shannon Herrera is a 62 y.o. female.  HPI  62 year old female with history of diabetes, hypertension, alcoholism comes in with chief complaint of nausea and vomiting.  Patient reports that over the past few days she has been having worsening nausea with associated vomiting, loose bowel movements and abdominal discomfort.  Patient also complains of bruising to her left arm.  Patient has history of alcohol abuse.  She says she was sober for an extended period of time, however this last week she had multiple days when she drank heavily.  Past Medical History:  Diagnosis Date  . Diabetes mellitus without complication (HCC)   . Hypertension   . Hypothyroid     Patient Active Problem List   Diagnosis Date Noted  . Hyperglycemia 01/22/2018  . Essential hypertension 01/22/2018  . Alcohol withdrawal syndrome with complication, with unspecified complication (HCC) 01/22/2018  . Elevated LFTs 01/22/2018  . Lactic acidosis 01/22/2018  . Hypothyroidism 01/22/2018  . Thrombocytopenia (HCC) 01/22/2018  . Hypomagnesemia 01/22/2018  . Acute renal injury (HCC) 01/22/2018    History reviewed. No pertinent surgical history.   OB History   None      Home Medications    Prior to Admission medications   Medication Sig Start Date End Date Taking? Authorizing Provider  traZODone (DESYREL) 50 MG tablet Take 50 mg by mouth at bedtime.   Yes [provider]  chlordiazePOXIDE (LIBRIUM) 10 MG capsule Take 1 capsule (10 mg total) by mouth 3 (three) times daily. 05/01/18   Derwood Kaplan, MD  levothyroxine (SYNTHROID, LEVOTHROID) 100 MCG tablet Take 1 tablet (100 mcg total) by mouth daily before breakfast. 05/01/18 08/29/18  Derwood Kaplan, MD  lisinopril-hydrochlorothiazide  (PRINZIDE,ZESTORETIC) 20-25 MG tablet Take 1 tablet by mouth daily. 05/01/18   Derwood Kaplan, MD  metFORMIN (GLUCOPHAGE) 1000 MG tablet Take 1 tablet (1,000 mg total) by mouth 2 (two) times daily with a meal. 05/01/18   Derwood Kaplan, MD  ondansetron (ZOFRAN ODT) 4 MG disintegrating tablet Take 1 tablet (4 mg total) by mouth every 8 (eight) hours as needed for nausea or vomiting. 05/01/18   Derwood Kaplan, MD  potassium chloride SA (K-DUR,KLOR-CON) 20 MEQ tablet Take 1 tablet (20 mEq total) by mouth daily. 05/01/18   Derwood Kaplan, MD  sertraline (ZOLOFT) 25 MG tablet Take 1 tablet (25 mg total) by mouth daily. 05/01/18 05/01/19  Derwood Kaplan, MD    Family History No family history on file.  Social History Social History   Tobacco Use  . Smoking status: Never Smoker  . Smokeless tobacco: Never Used  Substance Use Topics  . Alcohol use: Yes    Comment: few times a week   . Drug use: Never     Allergies   Patient has no known allergies.   Review of Systems Review of Systems  Constitutional: Positive for activity change and fatigue.  Respiratory: Negative for shortness of breath.   Cardiovascular: Negative for chest pain.  Gastrointestinal: Positive for abdominal pain, diarrhea, nausea and vomiting.  Skin: Positive for rash.  Allergic/Immunologic: Negative for immunocompromised state.  Hematological: Does not bruise/bleed easily.  All other systems reviewed and are negative.    Physical Exam Updated Vital Signs BP (!) 182/97 (BP Location: Right Arm)   Pulse (!) 114  Temp 98.2 F (36.8 C) (Oral)   Resp (!) 22   Ht 5\' 1"  (1.549 m)   Wt 51 kg (112 lb 8 oz)   SpO2 99%   BMI 21.26 kg/m   Physical Exam  Constitutional: She is oriented to person, place, and time. She appears well-developed.  HENT:  Head: Normocephalic and atraumatic.  Eyes: EOM are normal. Scleral icterus is present.  Neck: Normal range of motion. Neck supple.  Cardiovascular: Normal rate.    Pulmonary/Chest: Effort normal.  Abdominal: Bowel sounds are normal.  Neurological: She is alert and oriented to person, place, and time.  Skin: Skin is warm and dry. Rash noted.  Large ecchymotic / bruise on the forearm on the left side  Nursing note and vitals reviewed.    ED Treatments / Results  Labs (all labs ordered are listed, but only abnormal results are displayed) Labs Reviewed  LIPASE, BLOOD - Abnormal; Notable for the following components:      Result Value   Lipase 134 (*)    All other components within normal limits  COMPREHENSIVE METABOLIC PANEL - Abnormal; Notable for the following components:   Sodium 131 (*)    Potassium 2.9 (*)    Chloride 89 (*)    Glucose, Bld 488 (*)    Creatinine, Ser 1.19 (*)    AST 190 (*)    ALT 234 (*)    Total Bilirubin 2.9 (*)    GFR calc non Af Amer 48 (*)    GFR calc Af Amer 55 (*)    All other components within normal limits  CBC - Abnormal; Notable for the following components:   WBC 2.3 (*)    HCT 35.6 (*)    Platelets 30 (*)    All other components within normal limits  URINALYSIS, ROUTINE W REFLEX MICROSCOPIC - Abnormal; Notable for the following components:   Color, Urine AMBER (*)    APPearance HAZY (*)    Glucose, UA >=500 (*)    Hgb urine dipstick MODERATE (*)    Ketones, ur 5 (*)    Protein, ur 100 (*)    Leukocytes, UA LARGE (*)    WBC, UA >50 (*)    Bacteria, UA RARE (*)    Non Squamous Epithelial 0-5 (*)    All other components within normal limits  MAGNESIUM - Abnormal; Notable for the following components:   Magnesium 1.3 (*)    All other components within normal limits  CBG MONITORING, ED - Abnormal; Notable for the following components:   Glucose-Capillary 390 (*)    All other components within normal limits  APTT  PROTIME-INR    EKG EKG Interpretation  Date/Time:  Sunday May 01 2018 17:44:53 EDT Ventricular Rate:  106 PR Interval:    QRS Duration: 89 QT Interval:  371 QTC  Calculation: 493 R Axis:   29 Text Interpretation:  Sinus tachycardia Abnormal R-wave progression, early transition Borderline prolonged QT interval No acute changes Confirmed by Derwood Kaplan [phone]) on 05/01/2018 5:54:11 PM   Radiology Ct Abdomen Pelvis W Contrast  Result Date: 05/01/2018 CLINICAL DATA:  Abdominal pain with vomiting and diarrhea EXAM: CT ABDOMEN AND PELVIS WITH CONTRAST TECHNIQUE: Multidetector CT imaging of the abdomen and pelvis was performed using the standard protocol following bolus administration of intravenous contrast. CONTRAST:  80mL OMNIPAQUE IOHEXOL 300 MG/ML  SOLN COMPARISON:  April 21, 2005 FINDINGS: Lower chest: There is mild bibasilar atelectatic change. No consolidation in the lung bases. Hepatobiliary: There is  hepatic steatosis. No focal liver lesions are appreciable. Gallbladder is mildly distended without wall thickening. There is no biliary duct dilatation. Pancreas: There are pancreatic calcifications consistent with chronic pancreatitis. There is diffuse pancreatic duct dilatation. No well-defined pancreatic mass or peripancreatic inflammation. No abnormal fluid surrounding the pancreas. No appreciable pseudocyst. Spleen: No splenic lesions are evident. Adrenals/Urinary Tract: Adrenals bilaterally are unremarkable in appearance. There is a 1 x 1 cm cyst arising from the lower pole the right kidney posteriorly. There is a 5 mm cyst in the posterior right kidney more medially. There is no hydronephrosis on either side. There is no renal or ureteral calculus on either side. Urinary bladder is midline with wall thickness within normal limits. Stomach/Bowel: There is no appreciable bowel wall or mesenteric thickening. No evident bowel obstruction. No free air or portal venous air. Vascular/Lymphatic: There is no abdominal aortic aneurysm. No vascular lesions are appreciable. There is no adenopathy in the abdomen or pelvis. Reproductive: Uterus appears atrophic.  No pelvic  mass evident. Other: Appendix appears normal. There is no abscess or ascites in the abdomen or pelvis. Musculoskeletal: There is degenerative change in the lumbar spine. There are no blastic or lytic bone lesions. There is no intramuscular or abdominal wall lesion evident. IMPRESSION: 1. Changes of chronic pancreatitis. Pancreas is somewhat atrophic containing multiple calcifications. There is diffuse pancreatic duct dilatation. No acute appearing pancreatitis evident. No pancreatic mass or pseudocyst. No peripancreatic fluid or mesenteric stranding. 2. No evident bowel obstruction. No abscess in the abdomen pelvis. Appendix appears normal. 3.  No renal or ureteral calculi.  No hydronephrosis. 4. Hepatic steatosis without focal liver lesion evident. Gallbladder mildly distended without wall thickening. Electronically Signed   By: Bretta BangWilliam  Woodruff III M.D.   On: 05/01/2018 18:58    Procedures Procedures (including critical care time)  Medications Ordered in ED Medications  thiamine (VITAMIN B-1) tablet 100 mg (has no administration in time range)  folic acid (FOLVITE) tablet 1 mg (has no administration in time range)  insulin aspart (novoLOG) injection 10 Units (has no administration in time range)  potassium chloride SA (K-DUR,KLOR-CON) CR tablet 40 mEq (has no administration in time range)  ondansetron (ZOFRAN) injection 4 mg (4 mg Intravenous Given 05/01/18 1510)  sodium chloride 0.9 % bolus 1,000 mL (0 mLs Intravenous Stopped 05/01/18 1611)  magnesium sulfate IVPB 2 g 50 mL (0 g Intravenous Stopped 05/01/18 1756)  potassium chloride SA (K-DUR,KLOR-CON) CR tablet 40 mEq (40 mEq Oral Given 05/01/18 1656)  iohexol (OMNIPAQUE) 300 MG/ML solution 100 mL (80 mLs Intravenous Contrast Given 05/01/18 1821)     Initial Impression / Assessment and Plan / ED Course  I have reviewed the triage vital signs and the nursing notes.  Pertinent labs & imaging results that were available during my care of the  patient were reviewed by me and considered in my medical decision making (see chart for details).  Clinical Course as of May 01 1928  Sun May 01, 2018  1724 Likely explains the bruising. 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 Patient made aware that her thrombocytopenia could be because of her alcohol abuse and resultant liver disease.  She has been advised to refrain from alcohol use.  Platelets(!): 30 [AN]  1725 No DKA  Glucose(!): 488 [AN]  1725 LFTs and bilirubin are elevated.  Lipase is also elevated at 134. Patient does not have any abdominal pain.  Since she is having painless jaundice, pancreatic mass would be in the consideration  especially given since she has elevated lipase.  Patient has been noncompliant with her medication, does not follow-up with primary care doctors therefore it might be worthwhile for [place] to get a CT abdomen pelvis in the ED to rule out any pancreatic mass.  Patient made aware of this plan.  Total Bilirubin(!): 2.9 [AN]  1726 Potassium and magnesium are being replaced.   [AN]    Clinical Course User Index [AN] Derwood Kaplan, MD    62 year old female comes in with chief complaint of abdominal discomfort with nausea, vomiting and diarrhea. She also has history of heavy alcohol use, and had episodes of binge drinking this weekend.  At the moment she is not undergoing any concerning symptoms for alcohol withdrawals. Patient is noted to be slightly jaundiced.  Basic labs including LFTs have been ordered.  Patient has large noticeable bruise in the left arm, and she reports bleeding from her dentition.  She denies any vaginal bleeding or bloody stools or melena.  Patient patient has poor dentition, and requires multiple dental surgery, which could be contributing to her bleeding gums -but she has history of thrombocytopenia as well according to our records.   Plan is to get basic labs and reassess.  7:29 PM CT results reviewed. No acute findings.   Will discharge.   Final Clinical Impressions(s) / ED Diagnoses   Final diagnoses:  Hypokalemia  Hypomagnesemia  Elevated LFTs  Elevated bilirubin  Thrombocytopenia [facility])    ED Discharge Orders        Ordered    chlordiazePOXIDE (LIBRIUM) 10 MG capsule  3 times daily     05/01/18 1926    ondansetron (ZOFRAN ODT) 4 MG disintegrating tablet  Every 8 hours PRN     05/01/18 1926    potassium chloride SA (K-DUR,KLOR-CON) 20 MEQ tablet  Daily     05/01/18 1926    levothyroxine (SYNTHROID, LEVOTHROID) 100 MCG tablet  Daily before breakfast     05/01/18 1929    lisinopril-hydrochlorothiazide (PRINZIDE,ZESTORETIC) 20-25 MG tablet  Daily     05/01/18 1929    metFORMIN (GLUCOPHAGE) 1000 MG tablet  2 times daily with meals     05/01/18 1929    sertraline (ZOLOFT) 25 MG tablet  Daily     05/01/18 1929         Derwood Kaplan, MD 05/01/18 1801    Derwood Kaplan, MD 05/01/18 1929

## 2018-05-01 NOTE — ED Triage Notes (Addendum)
Pt c/o vomiting, diarrhea, generalized weakness, mid abdominal pain x 3 days and  left upper arm bruising with pain x 1 week.  Pt reports hx of HTN but on no medications. BP 178/102 in triage.

## 2018-05-01 NOTE — Discharge Instructions (Addendum)
We saw in the ER for generalized weakness and abdominal discomfort. Multiple lab abnormalities were noted, and you will need to see your primary care doctor again in 2 to 4 weeks to repeat your LFTs, electrolytes, CBC and magnesium levels.  Refrain from alcohol use. You were noted to have low platelet count, which is why you are bruising easily.  Please have your primary care doctor also assess you for that.

## 2018-07-03 ENCOUNTER — Emergency Department: Payer: Self-pay

## 2018-07-03 ENCOUNTER — Other Ambulatory Visit: Payer: Self-pay

## 2018-07-03 ENCOUNTER — Inpatient Hospital Stay
Admission: EM | Admit: 2018-07-03 | Discharge: 2018-07-06 | DRG: 897 | Disposition: A | Discharge status: Home / Self Care | Payer: Self-pay | Attending: Internal Medicine | Admitting: Internal Medicine

## 2018-07-03 DIAGNOSIS — F419 Anxiety disorder, unspecified: Secondary | ICD-10-CM | POA: Diagnosis present

## 2018-07-03 DIAGNOSIS — Z79899 Other long term (current) drug therapy: Secondary | ICD-10-CM

## 2018-07-03 DIAGNOSIS — Y908 Blood alcohol level of 240 mg/100 ml or more: Secondary | ICD-10-CM | POA: Diagnosis present

## 2018-07-03 DIAGNOSIS — Z7984 Long term (current) use of oral hypoglycemic drugs: Secondary | ICD-10-CM

## 2018-07-03 DIAGNOSIS — N390 Urinary tract infection, site not specified: Secondary | ICD-10-CM | POA: Diagnosis present

## 2018-07-03 DIAGNOSIS — A0472 Enterocolitis due to Clostridium difficile, not specified as recurrent: Secondary | ICD-10-CM | POA: Diagnosis present

## 2018-07-03 DIAGNOSIS — Z23 Encounter for immunization: Secondary | ICD-10-CM

## 2018-07-03 DIAGNOSIS — E876 Hypokalemia: Secondary | ICD-10-CM | POA: Diagnosis not present

## 2018-07-03 DIAGNOSIS — F10129 Alcohol abuse with intoxication, unspecified: Principal | ICD-10-CM | POA: Diagnosis present

## 2018-07-03 DIAGNOSIS — D61818 Other pancytopenia: Secondary | ICD-10-CM | POA: Diagnosis not present

## 2018-07-03 DIAGNOSIS — E860 Dehydration: Secondary | ICD-10-CM | POA: Diagnosis present

## 2018-07-03 DIAGNOSIS — E119 Type 2 diabetes mellitus without complications: Secondary | ICD-10-CM | POA: Diagnosis present

## 2018-07-03 DIAGNOSIS — R000 Tachycardia, unspecified: Secondary | ICD-10-CM

## 2018-07-03 DIAGNOSIS — R55 Syncope and collapse: Secondary | ICD-10-CM | POA: Diagnosis present

## 2018-07-03 DIAGNOSIS — E039 Hypothyroidism, unspecified: Secondary | ICD-10-CM | POA: Diagnosis present

## 2018-07-03 DIAGNOSIS — F10929 Alcohol use, unspecified with intoxication, unspecified: Secondary | ICD-10-CM

## 2018-07-03 DIAGNOSIS — I10 Essential (primary) hypertension: Secondary | ICD-10-CM | POA: Diagnosis present

## 2018-07-03 LAB — CBC WITH DIFFERENTIAL/PLATELET
Basophils Absolute: 0.1 K/uL (ref 0–0.1)
Basophils Relative: 1 %
Eosinophils Absolute: 0 K/uL (ref 0–0.7)
Eosinophils Relative: 1 %
HCT: 35 % (ref 35.0–47.0)
Hemoglobin: 12 g/dL (ref 12.0–16.0)
Lymphocytes Relative: 15 %
Lymphs Abs: 1.5 K/uL (ref 1.0–3.6)
MCH: 31.4 pg (ref 26.0–34.0)
MCHC: 34.4 g/dL (ref 32.0–36.0)
MCV: 91.3 fL (ref 80.0–100.0)
Monocytes Absolute: 0.3 K/uL (ref 0.2–0.9)
Monocytes Relative: 3 %
Neutro Abs: 8.2 K/uL — ABNORMAL HIGH (ref 1.4–6.5)
Neutrophils Relative %: 80 %
Platelets: 158 K/uL (ref 150–440)
RBC: 3.83 MIL/uL (ref 3.80–5.20)
RDW: 14.3 % (ref 11.5–14.5)
WBC: 10.1 K/uL (ref 3.6–11.0)

## 2018-07-03 LAB — COMPREHENSIVE METABOLIC PANEL
ALT: 67 U/L — AB (ref 0–44)
AST: 103 U/L — ABNORMAL HIGH (ref 15–41)
Albumin: 4.6 g/dL (ref 3.5–5.0)
Alkaline Phosphatase: 97 U/L (ref 38–126)
Anion gap: 27 — ABNORMAL HIGH (ref 5–15)
BILIRUBIN TOTAL: 1.4 mg/dL — AB (ref 0.3–1.2)
BUN: 26 mg/dL — ABNORMAL HIGH (ref 8–23)
CO2: 14 mmol/L — ABNORMAL LOW (ref 22–32)
Calcium: 9.5 mg/dL (ref 8.9–10.3)
Chloride: 101 mmol/L (ref 98–111)
Creatinine, Ser: 1.19 mg/dL — ABNORMAL HIGH (ref 0.44–1.00)
GFR calc Af Amer: 55 mL/min — ABNORMAL LOW (ref >60)
GFR, EST NON AFRICAN AMERICAN: 48 mL/min — AB (ref >60)
Glucose, Bld: 232 mg/dL — ABNORMAL HIGH (ref 70–99)
POTASSIUM: 3.7 mmol/L (ref 3.5–5.1)
Sodium: 142 mmol/L (ref 135–145)
Total Protein: 8.3 g/dL — ABNORMAL HIGH (ref 6.5–8.1)

## 2018-07-03 LAB — TROPONIN I: Troponin I: <0.03 ng/mL (ref <0.03)

## 2018-07-03 LAB — URINE DRUG SCREEN, QUALITATIVE (ARMC ONLY)
Amphetamines, Ur Screen: NOT DETECTED
BENZODIAZEPINE, UR SCRN: NOT DETECTED
Barbiturates, Ur Screen: NOT DETECTED
CANNABINOID 50 NG, UR ~~LOC~~: NOT DETECTED
Cocaine Metabolite,Ur ~~LOC~~: NOT DETECTED
MDMA (Ecstasy)Ur Screen: NOT DETECTED
Methadone Scn, Ur: NOT DETECTED
Opiate, Ur Screen: NOT DETECTED
PHENCYCLIDINE (PCP) UR S: NOT DETECTED
TRICYCLIC, UR SCREEN: NOT DETECTED

## 2018-07-03 LAB — ETHANOL: Alcohol, Ethyl (B): 263 mg/dL — ABNORMAL HIGH

## 2018-07-03 LAB — URINALYSIS, COMPLETE (UACMP) WITH MICROSCOPIC
Bilirubin Urine: NEGATIVE
GLUCOSE, UA: 50 mg/dL — AB
KETONES UR: 5 mg/dL — AB
Nitrite: POSITIVE — AB
PROTEIN: NEGATIVE mg/dL
Specific Gravity, Urine: 1.017 (ref 1.005–1.030)
pH: 5 (ref 5.0–8.0)

## 2018-07-03 LAB — MRSA PCR SCREENING: MRSA BY PCR: NEGATIVE

## 2018-07-03 LAB — TSH: TSH: 0.707 u[IU]/mL (ref 0.350–4.500)

## 2018-07-03 LAB — T4, FREE: FREE T4: 0.73 ng/dL — AB (ref 0.82–1.77)

## 2018-07-03 LAB — CK: CK TOTAL: 53 U/L (ref 38–234)

## 2018-07-03 LAB — GLUCOSE, CAPILLARY: GLUCOSE-CAPILLARY: 143 mg/dL — AB (ref 70–99)

## 2018-07-03 LAB — SALICYLATE LEVEL: Salicylate Lvl: <7 mg/dL (ref 2.8–30.0)

## 2018-07-03 LAB — ACETAMINOPHEN LEVEL: Acetaminophen (Tylenol), Serum: <10 ug/mL — ABNORMAL LOW (ref 10–30)

## 2018-07-03 MED ORDER — SODIUM CHLORIDE 0.9 % IV SOLN
INTRAVENOUS | Status: DC
  Administered 2018-07-03 – 2018-07-06 (×5): via INTRAVENOUS

## 2018-07-03 MED ORDER — IOHEXOL 350 MG/ML SOLN
60.0000 mL | Freq: Once | INTRAVENOUS | Status: AC | PRN
  Administered 2018-07-03: 60 mL via INTRAVENOUS

## 2018-07-03 MED ORDER — INFLUENZA VAC SPLIT QUAD 0.5 ML IM SUSY
0.5000 mL | PREFILLED_SYRINGE | INTRAMUSCULAR | Status: AC
  Administered 2018-07-04: 0.5 mL via INTRAMUSCULAR
  Filled 2018-07-03: qty 0.5

## 2018-07-03 MED ORDER — LORAZEPAM 2 MG/ML IJ SOLN: 1.0000 mg | Freq: Four times a day (QID) | INTRAMUSCULAR | Status: DC | PRN

## 2018-07-03 MED ORDER — ACETAMINOPHEN 650 MG RE SUPP: 650.0000 mg | Freq: Four times a day (QID) | RECTAL | Status: DC | PRN

## 2018-07-03 MED ORDER — INSULIN ASPART 100 UNIT/ML ~~LOC~~ SOLN
0.0000 [IU] | Freq: Three times a day (TID) | SUBCUTANEOUS | Status: DC
  Administered 2018-07-04 (×3): 2 [IU] via SUBCUTANEOUS
  Administered 2018-07-05: 1 [IU] via SUBCUTANEOUS
  Administered 2018-07-05: 5 [IU] via SUBCUTANEOUS
  Administered 2018-07-05: 2 [IU] via SUBCUTANEOUS
  Administered 2018-07-06: 3 [IU] via SUBCUTANEOUS
  Administered 2018-07-06: 2 [IU] via SUBCUTANEOUS
  Filled 2018-07-03 (×8): qty 1

## 2018-07-03 MED ORDER — SODIUM CHLORIDE 0.9 % IV SOLN
1.0000 g | Freq: Once | INTRAVENOUS | Status: AC
  Administered 2018-07-03: 1 g via INTRAVENOUS
  Filled 2018-07-03: qty 10

## 2018-07-03 MED ORDER — ONDANSETRON HCL 4 MG PO TABS: 4.0000 mg | ORAL_TABLET | Freq: Four times a day (QID) | ORAL | Status: DC | PRN

## 2018-07-03 MED ORDER — CIPROFLOXACIN HCL 500 MG PO TABS
250.0000 mg | ORAL_TABLET | Freq: Two times a day (BID) | ORAL | Status: DC
  Filled 2018-07-03 (×4): qty 0.5

## 2018-07-03 MED ORDER — ACETAMINOPHEN 325 MG PO TABS
650.0000 mg | ORAL_TABLET | Freq: Four times a day (QID) | ORAL | Status: DC | PRN
  Administered 2018-07-05 – 2018-07-06 (×2): 650 mg via ORAL
  Filled 2018-07-03 (×2): qty 2

## 2018-07-03 MED ORDER — ENOXAPARIN SODIUM 40 MG/0.4ML ~~LOC~~ SOLN
40.0000 mg | SUBCUTANEOUS | Status: DC
  Administered 2018-07-03 – 2018-07-05 (×3): 40 mg via SUBCUTANEOUS
  Filled 2018-07-03 (×3): qty 0.4

## 2018-07-03 MED ORDER — FOLIC ACID 1 MG PO TABS
1.0000 mg | ORAL_TABLET | Freq: Every day | ORAL | Status: DC
  Administered 2018-07-04 – 2018-07-06 (×3): 1 mg via ORAL
  Filled 2018-07-03 (×4): qty 1

## 2018-07-03 MED ORDER — METOPROLOL TARTRATE 25 MG PO TABS
25.0000 mg | ORAL_TABLET | Freq: Two times a day (BID) | ORAL | Status: DC
  Administered 2018-07-03 – 2018-07-04 (×3): 25 mg via ORAL
  Filled 2018-07-03 (×3): qty 1

## 2018-07-03 MED ORDER — VITAMIN B-1 100 MG PO TABS
100.0000 mg | ORAL_TABLET | Freq: Every day | ORAL | Status: DC
  Administered 2018-07-03 – 2018-07-06 (×4): 100 mg via ORAL
  Filled 2018-07-03 (×4): qty 1

## 2018-07-03 MED ORDER — PNEUMOCOCCAL VAC POLYVALENT 25 MCG/0.5ML IJ INJ
0.5000 mL | INJECTION | INTRAMUSCULAR | Status: AC
  Administered 2018-07-04: 0.5 mL via INTRAMUSCULAR
  Filled 2018-07-03: qty 0.5

## 2018-07-03 MED ORDER — LORAZEPAM 2 MG/ML IJ SOLN
2.0000 mg | Freq: Once | INTRAMUSCULAR | Status: AC
  Administered 2018-07-03: 2 mg via INTRAVENOUS
  Filled 2018-07-03: qty 1

## 2018-07-03 MED ORDER — SODIUM CHLORIDE 0.9 % IV BOLUS
1000.0000 mL | Freq: Once | INTRAVENOUS | Status: AC
  Administered 2018-07-03: 1000 mL via INTRAVENOUS

## 2018-07-03 MED ORDER — LORAZEPAM 1 MG PO TABS
1.0000 mg | ORAL_TABLET | Freq: Four times a day (QID) | ORAL | Status: DC | PRN
  Administered 2018-07-04 – 2018-07-06 (×3): 1 mg via ORAL
  Filled 2018-07-03 (×3): qty 1

## 2018-07-03 MED ORDER — SODIUM CHLORIDE 0.9 % IV BOLUS
2000.0000 mL | Freq: Once | INTRAVENOUS | Status: AC
  Administered 2018-07-03: 2000 mL via INTRAVENOUS

## 2018-07-03 MED ORDER — ONDANSETRON HCL 4 MG/2ML IJ SOLN: 4.0000 mg | Freq: Four times a day (QID) | INTRAMUSCULAR | Status: DC | PRN

## 2018-07-03 MED ORDER — ADULT MULTIVITAMIN W/MINERALS CH
1.0000 | ORAL_TABLET | Freq: Every day | ORAL | Status: DC
  Administered 2018-07-04 – 2018-07-06 (×3): 1 via ORAL
  Filled 2018-07-03 (×3): qty 1

## 2018-07-03 MED ORDER — THIAMINE HCL 100 MG/ML IJ SOLN
100.0000 mg | Freq: Every day | INTRAMUSCULAR | Status: DC
  Filled 2018-07-03: qty 2

## 2018-07-03 NOTE — ED Notes (Signed)
Pt had cash in her pockets and it was placed in patient valuables envelope. Envelope was given to Engineer, materialssecurity officer and officer gave yellow form and key #6 to this nurse. Will give the yellow form and key to nurse on admission floor.   $138 dollars were placed in envelope- (1) one hundred dollar bill, (1) ten dollar bill, and (28) one dollar bills.

## 2018-07-03 NOTE — ED Provider Notes (Signed)
[facility] Emergency Department Provider Note  ____________________________________________   First MD Initiated Contact with Patient 07/03/18 1607     (approximate)  I have reviewed the triage vital signs and the nursing notes.   HISTORY  Chief Complaint Altered Mental Status  5 exemption history limited by the patient's clinical condition  HPI Shannon Herrera is a 62 y.o. female who comes to the emergency department via EMS after being found obtunded in a parked car.  According to EMS the patient had recently been staying at a hotel and this morning checked out.  Someone in the hotel noticed that she was asleep in her car and contacted police who then contacted EMS when they noted she was difficult to arouse with a heavy smell of alcohol.  EMS noted a blood sugar in the 200s along with a narrow complex tachycardia in route which they described as "SVT".  She received no medications in route.  The patient is heavily intoxicated or confused and is unable to provide any history.    Past Medical History:  Diagnosis Date  . Diabetes mellitus without complication (HCC)   . Hypertension   . Hypothyroid     Patient Active Problem List   Diagnosis Date Noted  . Syncope and collapse 07/03/2018  . Hyperglycemia 01/22/2018  . Essential hypertension 01/22/2018  . Alcohol withdrawal syndrome with complication, with unspecified complication (HCC) 01/22/2018  . Elevated LFTs 01/22/2018  . Lactic acidosis 01/22/2018  . Hypothyroidism 01/22/2018  . Thrombocytopenia (HCC) 01/22/2018  . Hypomagnesemia 01/22/2018  . Acute renal injury (HCC) 01/22/2018    Past Surgical History:  Procedure Laterality Date  . denies      Prior to Admission medications   Medication Sig Start Date End Date Taking? Authorizing Provider  chlordiazePOXIDE (LIBRIUM) 10 MG capsule Take 1 capsule (10 mg total) by mouth 3 (three) times daily. 05/01/18   Derwood Kaplan, MD  levothyroxine  (SYNTHROID, LEVOTHROID) 100 MCG tablet Take 1 tablet (100 mcg total) by mouth daily before breakfast. 05/01/18 08/29/18  Derwood Kaplan, MD  levothyroxine (SYNTHROID, LEVOTHROID) 50 MCG tablet Take 50 mcg by mouth daily. 06/21/18   [provider]  lisinopril-hydrochlorothiazide (PRINZIDE,ZESTORETIC) 20-25 MG tablet Take 1 tablet by mouth daily. 05/01/18   Derwood Kaplan, MD  lovastatin (MEVACOR) 20 MG tablet Take 40 mg by mouth at bedtime. 06/21/18   [provider]  metFORMIN (GLUCOPHAGE) 1000 MG tablet Take 1 tablet (1,000 mg total) by mouth 2 (two) times daily with a meal. 05/01/18   Derwood Kaplan, MD  ondansetron (ZOFRAN ODT) 4 MG disintegrating tablet Take 1 tablet (4 mg total) by mouth every 8 (eight) hours as needed for nausea or vomiting. 05/01/18   Derwood Kaplan, MD  potassium chloride SA (K-DUR,KLOR-CON) 20 MEQ tablet Take 1 tablet (20 mEq total) by mouth daily. 05/01/18   Derwood Kaplan, MD  sertraline (ZOLOFT) 25 MG tablet Take 1 tablet (25 mg total) by mouth daily. 05/01/18 05/01/19  Derwood Kaplan, MD  traZODone (DESYREL) 50 MG tablet Take 50 mg by mouth at bedtime.    [provider]    Allergies Patient has no known allergies.  Family History  Problem Relation Age of Onset  . Hypertension Mother     Social History Social History   Tobacco Use  . Smoking status: Never Smoker  . Smokeless tobacco: Never Used  Substance Use Topics  . Alcohol use: Yes    Comment: intoxicated at this time  . Drug use:  Never    Review of Systems Level 5 exemption history limited by the patient's clinical condition  ____________________________________________   PHYSICAL EXAM:  VITAL SIGNS: ED Triage Vitals  Enc Vitals Group     BP 07/03/18 1605 (!) 154/100     Pulse Rate 07/03/18 1605 (!) 161     Resp 07/03/18 1605 20     Temp 07/03/18 1605 98.9 F (37.2 C)     Temp Source 07/03/18 1605 Axillary     SpO2 --      Weight 07/03/18 1602 105 lb 13.1 oz  (48 kg)     Height 07/03/18 1602 5\' 6"  (1.676 m)     Head Circumference --      Peak Flow --      Pain Score 07/03/18 1602 0     Pain Loc --      Pain Edu? --      Excl. in GC? --     Constitutional: Obtunded but arousable to painful stimulus currently protecting her airway Eyes: PERRL EOMI. midrange and brisk Head: Atraumatic. Nose: No congestion/rhinnorhea. Mouth/Throat: No trismus Neck: No stridor.   Cardiovascular: Tachycardic rate, regular rhythm. Grossly normal heart sounds.  Good peripheral circulation. Respiratory: Decreased respiratory effort.  No retractions. Lungs CTAB and moving good air Gastrointestinal: Soft nontender Musculoskeletal: No lower extremity edema   Neurologic:  . No gross focal neurologic deficits are appreciated. Skin:  Skin is warm, dry and intact. No rash noted. Psychiatric: Obtunded   ____________________________________________   DIFFERENTIAL includes but not limited to  Alcohol intoxication, intracerebral hemorrhage, stroke, malignant arrhythmia, alcohol withdrawal ____________________________________________   LABS (all labs ordered are listed, but only abnormal results are displayed)  Labs Reviewed  ACETAMINOPHEN LEVEL - Abnormal; Notable for the following components:      Result Value   Acetaminophen (Tylenol), Serum <10 (*)    All other components within normal limits  COMPREHENSIVE METABOLIC PANEL - Abnormal; Notable for the following components:   CO2 14 (*)    Glucose, Bld 232 (*)    BUN 26 (*)    Creatinine, Ser 1.19 (*)    Total Protein 8.3 (*)    AST 103 (*)    ALT 67 (*)    Total Bilirubin 1.4 (*)    GFR calc non Af Amer 48 (*)    GFR calc Af Amer 55 (*)    Anion gap 27 (*)    All other components within normal limits  ETHANOL - Abnormal; Notable for the following components:   Alcohol, Ethyl (B) 263 (*)    All other components within normal limits  CBC WITH DIFFERENTIAL/PLATELET - Abnormal; Notable for the following  components:   Neutro Abs 8.2 (*)    All other components within normal limits  URINALYSIS, COMPLETE (UACMP) WITH MICROSCOPIC - Abnormal; Notable for the following components:   Color, Urine STRAW (*)    APPearance CLEAR (*)    Glucose, UA 50 (*)    Hgb urine dipstick SMALL (*)    Ketones, ur 5 (*)    Nitrite POSITIVE (*)    Leukocytes, UA SMALL (*)    Bacteria, UA FEW (*)    All other components within normal limits  T4, FREE - Abnormal; Notable for the following components:   Free T4 0.73 (*)    All other components within normal limits  URINE CULTURE  URINE CULTURE  MRSA PCR SCREENING  SALICYLATE LEVEL  TROPONIN I  URINE DRUG SCREEN, QUALITATIVE ([facility] ONLY)  CK  TSH  THYROID PANEL WITH TSH    Lab work reviewed by me shows she does have urinary tract infection.  Elevated ethanol level could explain some of her altered mental status.  She has a large anion gap which could be secondary to alcoholic ketoacidosis. __________________________________________  EKG  ED ECG REPORT I, Merrily Brittle, the attending physician, personally viewed and interpreted this ECG.  Date: 07/03/2018 EKG Time: 1604 Rate: 163 Rhythm: Narrow complex tachycardia appears sinus QRS Axis: normal Intervals: normal ST/T Wave abnormalities: Rate related ST changes Narrative Interpretation: no evidence of acute ischemia  ED ECG REPORT I, Merrily Brittle, the attending physician, personally viewed and interpreted this ECG.  Date: 07/03/2018 EKG Time: 1605 Rate: 165 Rhythm: Sinus tachycardia QRS Axis: normal Intervals: normal ST/T Wave abnormalities: Persistent rate related ST changes Narrative Interpretation: no evidence of acute ischemia  ED ECG REPORT I, Merrily Brittle, the attending physician, personally viewed and interpreted this ECG.  Date: 07/03/2018 EKG Time: 1852 Rate: 139 Rhythm: Sinus tachycardia QRS Axis: normal Intervals: normal ST/T Wave abnormalities: Questionable ST  elevation isolated to lead aVL Narrative Interpretation: no evidence of acute ischemia  ____________________________________________  RADIOLOGY  Chest x-ray reviewed by me with no acute disease Head CT reviewed by me with no acute disease CT angiogram of the chest reviewed by me with no acute disease ____________________________________________   PROCEDURES  Procedure(s) performed: Yes  .Cardioversion Date/Time: 07/03/2018 4:12 PM Performed by: Merrily Brittle, MD Authorized by: Merrily Brittle, MD   Consent:    Consent obtained:  Emergent situation Pre-procedure details:    Cardioversion basis:  Emergent   Pre-procedure rhythm: Unstable narrow complex tachycardia.   Electrode placement:  Anterior-posterior Patient sedated: No Attempt one:    Cardioversion mode:  Synchronous   Waveform:  Biphasic   Shock (Joules):  200   Shock outcome:  No change in rhythm Post-procedure details:    Patient status:  Awake   Patient tolerance of procedure:  Tolerated well, no immediate complications Comments:     The patient's heart rate was about 163 consistently despite movement.  Narrow complex.  EKG does appear to show P waves but unclear if these are retrograde or what is actually going on.  She is profoundly obtunded at this point and the history is unclear.  I have concerned that her mental status could be secondary to her rapid rate so decision was made to give 200 J synchronized cardioversion emergently.  Following the cardioversion her rate is essentially unchanged.  This likely was merely sinus tachycardia.   .Critical Care Performed by: Merrily Brittle, MD Authorized by: Merrily Brittle, MD   Critical care provider statement:    Critical care time (minutes):  35   Critical care time was exclusive of:  Separately billable procedures and treating other patients   Critical care was necessary to treat or prevent imminent or life-threatening deterioration of the following  conditions:  Toxidrome   Critical care was time spent personally by me on the following activities:  Development of treatment plan with patient or surrogate, discussions with consultants, evaluation of patient's response to treatment, examination of patient, obtaining history from patient or surrogate, ordering and performing treatments and interventions, ordering and review of laboratory studies, ordering and review of radiographic studies, pulse oximetry, re-evaluation of patient's condition and review of old charts    Critical Care performed: Yes  ____________________________________________   INITIAL IMPRESSION / ASSESSMENT AND PLAN / ED COURSE  Pertinent labs & imaging results that  were available during my care of the patient were reviewed by me and considered in my medical decision making (see chart for details).   As part of my medical decision making, I reviewed the following data within the electronic MEDICAL RECORD NUMBER History obtained from family if available, nursing notes, old chart and ekg, as well as notes from prior ED visits.  The patient arrived in the emergency department intoxicated, obtunded, and a narrow complex tachycardia to the upper 160s.  Differential is broad but included unstable SVT given her altered mental status so decision was made to emergently cardiovert the patient.  After 200 J synchronized she remained in the same rhythm and repeat EKG confirmed that this was in fact sinus tachycardia.  We then proceeded with aggressive fluid resuscitation in addition to head CT for altered mental status and broad labs including a drug screen and ethanol level.  I will reevaluate.     ----------------------------------------- 6:26 PM on 07/03/2018 -----------------------------------------  The patient is now more coherent and able to tell me "I drank too much today".  After 2 L of fluid her heart rate is come down to about 139.  I do not have a clear reason why her heart  rate remains so elevated so I will get a CT angiogram of her chest as well as add on a TSH and free T4.  Patient is yet to provide a urine sample and is possible she is cocaine positive so I will try 2 mg of Ativan and reevaluate.  After 2 mg of Ativan and 1/3 L of fluid the patient's heart rate remains in the 130s.  Her drug screen is negative.  Her urinalysis is concerning for infection although no evidence of pyelonephritis and this would not explain the degree of tachycardia.  Given the diagnostic uncertainty and persistently abnormal vital signs the patient requires inpatient admission for continued monitoring, IV fluid resuscitation, and IV antibiotics.  I spoke with the hospitalist who has graciously agreed to admit the patient to his service.  She is not currently withdrawing from alcohol. ____________________________________________   FINAL CLINICAL IMPRESSION(S) / ED DIAGNOSES  Final diagnoses:  Alcoholic intoxication with complication (HCC)  Urinary tract infection without hematuria, site unspecified  Tachycardia      NEW MEDICATIONS STARTED DURING THIS VISIT:  Current Discharge Medication List       Note:  This document was prepared using Dragon voice recognition software and may include unintentional dictation errors.     Merrily Brittle, MD 07/03/18 2147

## 2018-07-03 NOTE — ED Notes (Addendum)
Provider delivered shock for cardioversion at 200 - HR remained 159

## 2018-07-03 NOTE — ED Notes (Signed)
Patient valuables envelope yellow paper and key #6 given to DelshireBrittney, RN in ICU.

## 2018-07-03 NOTE — ED Triage Notes (Signed)
Pt brought in by [facility] due to being found unresponsive in her car in hotel parking lot. Pt admits to drinking alcohol today. She is alert to verbal stimuli on arrival to ER. HR was in 160s prior to arrival. She was given a 500 ml bolus via 18 gauge IV catheter in RAC. HR is 155 bpm at this time. Denies pain. Denies sx.

## 2018-07-03 NOTE — H&P (Addendum)
[employer] - [place] at [facility]   PATIENT NAME: Shannon Herrera    MR#:  [id number]  DATE OF BIRTH:  [birth year]  DATE OF ADMISSION:  07/03/2018  PRIMARY CARE PHYSICIAN: Patient, No Pcp Per   REQUESTING/REFERRING PHYSICIAN: Merrily Brittle, MD  CHIEF COMPLAINT:   Chief Complaint  Patient presents with  . Altered Mental Status    HISTORY OF PRESENT ILLNESS: Shannon Herrera  is a 62 y.o. female with a known history of diabetes type 2, hypertension, hypothyroidism who was drinking heavily she states that she drank more than a bottle of wine and was in her car when she was found passed out.  She was brought to the ED and now she is awake however her heart rate continues to be elevated.  Rest of her evaluation is negative.  Patient currently denies any chest pain shortness of breath.  She received 3 L of IV fluids and heart rate continues to be elevated.        PAST MEDICAL HISTORY:   Past Medical History:  Diagnosis Date  . Diabetes mellitus without complication (HCC)   . Hypertension   . Hypothyroid     PAST SURGICAL HISTORY:  Past Surgical History:  Procedure Laterality Date  . denies      SOCIAL HISTORY:  Social History   Tobacco Use  . Smoking status: Never Smoker  . Smokeless tobacco: Never Used  Substance Use Topics  . Alcohol use: Yes    Comment: intoxicated at this time    FAMILY HISTORY:  Family History  Problem Relation Age of Onset  . Hypertension Mother     DRUG ALLERGIES: No Known Allergies  REVIEW OF SYSTEMS:   CONSTITUTIONAL: No fever, fatigue or weakness.  EYES: No blurred or double vision.  EARS, NOSE, AND THROAT: No tinnitus or ear pain.  RESPIRATORY: No cough, shortness of breath, wheezing or hemoptysis.  CARDIOVASCULAR: No chest pain, orthopnea, edema.  GASTROINTESTINAL: No nausea, vomiting, diarrhea or abdominal pain.  GENITOURINARY: No dysuria, hematuria.  ENDOCRINE: No polyuria, nocturia,  HEMATOLOGY: No anemia,  easy bruising or bleeding SKIN: No rash or lesion. MUSCULOSKELETAL: No joint pain or arthritis.   NEUROLOGIC: No tingling, numbness, weakness.  PSYCHIATRY: No anxiety or depression.   MEDICATIONS AT HOME:  Prior to Admission medications   Medication Sig Start Date End Date Taking? Authorizing Provider  chlordiazePOXIDE (LIBRIUM) 10 MG capsule Take 1 capsule (10 mg total) by mouth 3 (three) times daily. 05/01/18   Derwood Kaplan, MD  levothyroxine (SYNTHROID, LEVOTHROID) 100 MCG tablet Take 1 tablet (100 mcg total) by mouth daily before breakfast. 05/01/18 08/29/18  Derwood Kaplan, MD  lisinopril-hydrochlorothiazide (PRINZIDE,ZESTORETIC) 20-25 MG tablet Take 1 tablet by mouth daily. 05/01/18   Derwood Kaplan, MD  metFORMIN (GLUCOPHAGE) 1000 MG tablet Take 1 tablet (1,000 mg total) by mouth 2 (two) times daily with a meal. 05/01/18   Derwood Kaplan, MD  ondansetron (ZOFRAN ODT) 4 MG disintegrating tablet Take 1 tablet (4 mg total) by mouth every 8 (eight) hours as needed for nausea or vomiting. 05/01/18   Derwood Kaplan, MD  potassium chloride SA (K-DUR,KLOR-CON) 20 MEQ tablet Take 1 tablet (20 mEq total) by mouth daily. 05/01/18   Derwood Kaplan, MD  sertraline (ZOLOFT) 25 MG tablet Take 1 tablet (25 mg total) by mouth daily. 05/01/18 05/01/19  Derwood Kaplan, MD  traZODone (DESYREL) 50 MG tablet Take 50 mg by mouth at bedtime.    [provider]      PHYSICAL  EXAMINATION:   VITAL SIGNS: Blood pressure (!) 156/84, pulse (!) 133, temperature 97.6 F (36.4 C), temperature source Oral, resp. rate 19, height 5\' 6"  (1.676 m), weight 48 kg, SpO2 100 %.  GENERAL:  62 y.o.-year-old patient lying in the bed with no acute distress.  EYES: Pupils equal, round, reactive to light and accommodation. No scleral icterus. Extraocular muscles intact.  HEENT: Head atraumatic, normocephalic. Oropharynx and nasopharynx clear.  NECK:  Supple, no jugular venous distention. No thyroid enlargement, no  tenderness.  LUNGS: Normal breath sounds bilaterally, no wheezing, rales,rhonchi or crepitation. No use of accessory muscles of respiration.  CARDIOVASCULAR: S1, S2 normal.  Tachycardic no murmurs, rubs, or gallops.  ABDOMEN: Soft, nontender, nondistended. Bowel sounds present. No organomegaly or mass.  EXTREMITIES: No pedal edema, cyanosis, or clubbing.  NEUROLOGIC: Cranial nerves II through XII are intact. Muscle strength 5/5 in all extremities. Sensation intact. Gait not checked.  PSYCHIATRIC: The patient is alert and oriented x 3.  SKIN: No obvious rash, lesion, or ulcer.   LABORATORY PANEL:   CBC Recent Labs  Lab 07/03/18 1613  WBC 10.1  HGB 12.0  HCT 35.0  PLT 158  MCV 91.3  MCH 31.4  MCHC 34.4  RDW 14.3  LYMPHSABS 1.5  MONOABS 0.3  EOSABS 0.0  BASOSABS 0.1   ------------------------------------------------------------------------------------------------------------------  Chemistries  Recent Labs  Lab 07/03/18 1613  NA 142  K 3.7  CL 101  CO2 14*  GLUCOSE 232*  BUN 26*  CREATININE 1.19*  CALCIUM 9.5  AST 103*  ALT 67*  ALKPHOS 97  BILITOT 1.4*   ------------------------------------------------------------------------------------------------------------------ estimated creatinine clearance is 36.7 mL/min (A) (by C-G formula based on SCr of 1.19 mg/dL (H)). ------------------------------------------------------------------------------------------------------------------ Recent Labs    07/03/18 1613  TSH 0.707     Coagulation profile No results for input(s): INR, PROTIME in the last 168 hours. ------------------------------------------------------------------------------------------------------------------- No results for input(s): DDIMER in the last 72 hours. -------------------------------------------------------------------------------------------------------------------  Cardiac Enzymes Recent Labs  Lab 07/03/18 1613  TROPONINI <0.03    ------------------------------------------------------------------------------------------------------------------ Invalid input(s): POCBNP  ---------------------------------------------------------------------------------------------------------------  Urinalysis    Component Value Date/Time   COLORURINE STRAW (A) 07/03/2018 1613   APPEARANCEUR CLEAR (A) 07/03/2018 1613   LABSPEC 1.017 07/03/2018 1613   PHURINE 5.0 07/03/2018 1613   GLUCOSEU 50 (A) 07/03/2018 1613   HGBUR SMALL (A) 07/03/2018 1613   BILIRUBINUR NEGATIVE 07/03/2018 1613   KETONESUR 5 (A) 07/03/2018 1613   PROTEINUR NEGATIVE 07/03/2018 1613   NITRITE POSITIVE (A) 07/03/2018 1613   LEUKOCYTESUR SMALL (A) 07/03/2018 1613     RADIOLOGY: Ct Head Wo Contrast  Result Date: 07/03/2018 CLINICAL DATA:  Altered level of consciousness. EXAM: CT HEAD WITHOUT CONTRAST TECHNIQUE: Contiguous axial images were obtained from the base of the skull through the vertex without intravenous contrast. COMPARISON:  01/22/2018 FINDINGS: Brain: No acute intracranial abnormality. Specifically, no hemorrhage, hydrocephalus, mass lesion, acute infarction, or significant intracranial injury. Vascular: No hyperdense vessel or unexpected calcification. Skull: No acute calvarial abnormality. Sinuses/Orbits: Visualized paranasal sinuses and mastoids clear. Orbital soft tissues unremarkable. Other: None IMPRESSION: Normal study. Electronically Signed   By: Charlett Nose M.D.   On: 07/03/2018 16:26   Ct Angio Chest Pe W/cm &/or Wo Cm  Result Date: 07/03/2018 CLINICAL DATA:  Unresponsive in car.  Tachycardia. EXAM: CT ANGIOGRAPHY CHEST WITH CONTRAST TECHNIQUE: Multidetector CT imaging of the chest was performed using the standard protocol during bolus administration of intravenous contrast. Multiplanar CT image reconstructions and MIPs were obtained to evaluate the vascular anatomy.  CONTRAST:  60mL OMNIPAQUE IOHEXOL 350 MG/ML SOLN COMPARISON:  Chest  radiograph from earlier today. FINDINGS: Cardiovascular: The study is high quality for the evaluation of pulmonary embolism. There are no filling defects in the central, lobar, segmental or subsegmental pulmonary artery branches to suggest acute pulmonary embolism. Great vessels are normal in course and caliber. Normal heart size. No significant pericardial fluid/thickening. Mediastinum/Nodes: No discrete thyroid nodules. Unremarkable esophagus. No pathologically enlarged axillary, mediastinal or hilar lymph nodes. Lungs/Pleura: No pneumothorax. No pleural effusion. No acute consolidative airspace disease, lung masses or significant pulmonary nodules. Upper abdomen: Punctate calcifications throughout the limited visualized portions of the pancreas, which is incompletely evaluated. Musculoskeletal: No aggressive appearing focal osseous lesions. Moderate thoracic spondylosis. Review of the MIP images confirms the above findings. IMPRESSION: 1. No pulmonary embolism.  No active pulmonary disease. 2. Punctate calcifications throughout the limited visualized pancreas, compatible with chronic pancreatitis. Pancreas is incompletely evaluated on this scan. Electronically Signed   By: Delbert Phenix M.D.   On: 07/03/2018 19:07   Dg Chest Port 1 View  Result Date: 07/03/2018 CLINICAL DATA:  Acute shortness of breath and tachycardia. EXAM: PORTABLE CHEST 1 VIEW COMPARISON:  11/07/2009 chest radiograph FINDINGS: The cardiomediastinal silhouette is unremarkable. Defibrillator pads overlying the chest noted. There is no evidence of focal airspace disease, pulmonary edema, suspicious pulmonary nodule/mass, pleural effusion, or pneumothorax. No acute bony abnormalities are identified. IMPRESSION: No active disease. Electronically Signed   By: Harmon Pier M.D.   On: 07/03/2018 16:33    EKG: Orders placed or performed during the hospital encounter of 07/03/18  . EKG 12-Lead  . EKG 12-Lead  . EKG 12-Lead  . EKG 12-Lead  .  Repeat EKG  . Repeat EKG  . EKG 12-Lead  . EKG 12-Lead    IMPRESSION AND PLAN: Patient 62 year old with alcohol abuse presenting to the hospital found passed out in the car  1. Syncope and collapse-due to alcohol intoxication Will monitor on telemetry  2.  Sinus tachycardia likely due to alcohol intoxication I will start patient on low-dose metoprolol Check echocardiogram of the heart  3.  Hypertension patient does not know what blood pressure medications she is on we need to reconcile her medication will use metoprolol for now  4.  Diabetes type 2 I will place her on sliding scale insulin follow blood sugar  5.  Alcohol abuse we will place her on CIWA protocol  6.  Abnormal UA we will place her on oral Cipro   All the records are reviewed and case discussed with ED provider. Management plans discussed with the patient, family and they are in agreement.  CODE STATUS: Code Status History    Date Active Date Inactive Code Status Order ID Comments User Context   01/22/2018 2328 01/25/2018 1435 Full Code [id number]  Levie Heritage, DO Inpatient       TOTAL TIME TAKING CARE OF THIS PATIENT:55 minutes.    Auburn Bilberry M.D on 07/03/2018 at 8:27 PM  Between 7am to 6pm - Pager - [phone]  After 6pm go to [email or web address] - password [email or web address] [facility]  [employer] Office  [phone]  CC: Primary care physician; Patient, No Pcp Per

## 2018-07-04 ENCOUNTER — Observation Stay
Admit: 2018-07-04 | Discharge: 2018-07-04 | Disposition: A | Discharge status: Home / Self Care | Payer: Self-pay | Attending: Internal Medicine | Admitting: Internal Medicine

## 2018-07-04 LAB — GLUCOSE, CAPILLARY
GLUCOSE-CAPILLARY: 173 mg/dL — AB (ref 70–99)
GLUCOSE-CAPILLARY: 187 mg/dL — AB (ref 70–99)
GLUCOSE-CAPILLARY: 204 mg/dL — AB (ref 70–99)
Glucose-Capillary: 189 mg/dL — ABNORMAL HIGH (ref 70–99)

## 2018-07-04 LAB — C DIFFICILE QUICK SCREEN W PCR REFLEX
C DIFFICILE (CDIFF) TOXIN: NEGATIVE
C Diff antigen: POSITIVE — AB

## 2018-07-04 LAB — CLOSTRIDIUM DIFFICILE BY PCR, REFLEXED: Toxigenic C. Difficile by PCR: POSITIVE — AB

## 2018-07-04 LAB — TROPONIN I
Troponin I: <0.03 ng/mL (ref <0.03)
Troponin I: <0.03 ng/mL (ref <0.03)

## 2018-07-04 MED ORDER — PRAVASTATIN SODIUM 40 MG PO TABS
40.0000 mg | ORAL_TABLET | Freq: Every day | ORAL | Status: DC
  Administered 2018-07-04 – 2018-07-06 (×3): 40 mg via ORAL
  Filled 2018-07-04 (×3): qty 1

## 2018-07-04 MED ORDER — LEVOTHYROXINE SODIUM 50 MCG PO TABS
75.0000 ug | ORAL_TABLET | Freq: Every day | ORAL | Status: DC
  Administered 2018-07-05 – 2018-07-06 (×2): 75 ug via ORAL
  Filled 2018-07-04 (×2): qty 1

## 2018-07-04 MED ORDER — LEVOTHYROXINE SODIUM 50 MCG PO TABS: 50.0000 ug | ORAL_TABLET | Freq: Every day | ORAL | Status: DC

## 2018-07-04 MED ORDER — HYDROCHLOROTHIAZIDE 25 MG PO TABS
25.0000 mg | ORAL_TABLET | Freq: Every day | ORAL | Status: DC
  Administered 2018-07-04: 25 mg via ORAL
  Filled 2018-07-04: qty 1

## 2018-07-04 MED ORDER — LISINOPRIL 20 MG PO TABS
20.0000 mg | ORAL_TABLET | Freq: Every day | ORAL | Status: DC
  Administered 2018-07-04: 20 mg via ORAL
  Filled 2018-07-04: qty 1

## 2018-07-04 MED ORDER — INSULIN GLARGINE 100 UNIT/ML ~~LOC~~ SOLN
10.0000 [IU] | Freq: Every day | SUBCUTANEOUS | Status: DC
  Administered 2018-07-05 (×2): 10 [IU] via SUBCUTANEOUS
  Filled 2018-07-04 (×4): qty 0.1

## 2018-07-04 MED ORDER — VANCOMYCIN 50 MG/ML ORAL SOLUTION
125.0000 mg | Freq: Four times a day (QID) | ORAL | Status: DC
  Administered 2018-07-04 – 2018-07-06 (×8): 125 mg via ORAL
  Filled 2018-07-04 (×10): qty 2.5

## 2018-07-04 MED ORDER — METOPROLOL TARTRATE 5 MG/5ML IV SOLN
5.0000 mg | Freq: Once | INTRAVENOUS | Status: AC
  Administered 2018-07-04: 5 mg via INTRAVENOUS
  Filled 2018-07-04: qty 5

## 2018-07-04 MED ORDER — SODIUM CHLORIDE 0.9 % IV SOLN
1.0000 g | INTRAVENOUS | Status: DC
  Administered 2018-07-04: 1 g via INTRAVENOUS
  Filled 2018-07-04: qty 1
  Filled 2018-07-04: qty 10

## 2018-07-04 NOTE — Clinical Social Work Note (Signed)
Consult for CSW made for assistance with medication and financial assistance. RN CM attempted to see patient but patient sent RN CM away at this time. York SpanielMonica Amin Fornwalt MSW,LCSW [phone]

## 2018-07-04 NOTE — Progress Notes (Signed)
*  PRELIMINARY RESULTS* Echocardiogram 2D Echocardiogram has been performed.  Shannon Herrera, Shannon Herrera 07/04/2018, 9:21 AM

## 2018-07-04 NOTE — Progress Notes (Signed)
HR increased to the 140s when patient stood [facility].

## 2018-07-04 NOTE — Progress Notes (Signed)
Report called to [facility] on 2A. Number given incase she has any questions.

## 2018-07-04 NOTE — Care Management Note (Signed)
Case Management Note  Patient Details  Name: Shannon Herrera MRN: [id number] Date of Birth: 02/16/1956  Subjective/Objective:                 Consult present for medication needs.  Patient placed in observation for syncope due to etoh. [place].  has [facility]. She is on CIWA and highest score is 2.  heart rate elevated. Asks that CM come back later to to talk. "I do not feel like it right now."   Action/Plan:  Return to patient at a later time to assess.  Expected Discharge Date:  07/05/18               Expected Discharge Plan:     In-House Referral:     Discharge planning Services     Post Acute Care Choice:    Choice offered to:     DME Arranged:    DME Agency:     HH Arranged:    HH Agency:     Status of Service:     If discussed at [employer] Meetings, dates discussed:    Additional Comments:  Eber HongGreene, Hanalei Glace R, RN 07/04/2018, 10:44 AM

## 2018-07-04 NOTE — Progress Notes (Signed)
[employer] - [place] at [facility]   PATIENT NAME: Shannon Herrera    MR#:  [id number]  DATE OF BIRTH:  Oct 31, 1955  SUBJECTIVE:  CHIEF COMPLAINT:   Chief Complaint  Patient presents with  . Altered Mental Status    -Admitted after   syncope after heavy alcohol intake.  Alert and awake this morning.  Complains of anxiety                                        REVIEW OF SYSTEMS:  Review of Systems  Constitutional: Negative for chills, fever and malaise/fatigue.  HENT: Negative for congestion, ear discharge, hearing loss and nosebleeds.   Eyes: Negative for blurred vision and double vision.  Respiratory: Negative for cough, shortness of breath and wheezing.   Cardiovascular: Negative for chest pain and palpitations.  Gastrointestinal: Positive for nausea. Negative for abdominal pain, constipation, diarrhea and vomiting.  Genitourinary: Negative for dysuria.  Neurological: Negative for dizziness, focal weakness, seizures, weakness and headaches.  Psychiatric/Behavioral: The patient is nervous/anxious.     DRUG ALLERGIES:  No Known Allergies  VITALS:  Blood pressure (!) 163/93, pulse (!) 105, temperature 98.6 F (37 C), temperature source Oral, resp. rate 15, height 5\' 2"  (1.575 m), weight 48 kg, SpO2 99 %.  PHYSICAL EXAMINATION:  Physical Exam  GENERAL:  62 y.o.-year-old patient lying in the bed with no acute distress.  EYES: Pupils equal, round, reactive to light and accommodation. No scleral icterus. Extraocular muscles intact.  HEENT: Head atraumatic, normocephalic. Oropharynx and nasopharynx clear.  NECK:  Supple, no jugular venous distention. No thyroid enlargement, no tenderness.  LUNGS: Normal breath sounds bilaterally, no wheezing, rales,rhonchi or crepitation. No use of accessory muscles of respiration.  CARDIOVASCULAR: S1, S2 normal.  Rapid rate and normal rhythm.  No murmurs, rubs, or gallops.  ABDOMEN: Soft, nontender, nondistended. Bowel sounds  present. No organomegaly or mass.  EXTREMITIES: No pedal edema, cyanosis, or clubbing.  NEUROLOGIC: Cranial nerves II through XII are intact. Muscle strength 5/5 in all extremities. Sensation intact. Gait not checked.  PSYCHIATRIC: The patient is alert and oriented x 3.  Appears very anxious SKIN: No obvious rash, lesion, or ulcer.    LABORATORY PANEL:   CBC Recent Labs  Lab 07/03/18 1613  WBC 10.1  HGB 12.0  HCT 35.0  PLT 158   ------------------------------------------------------------------------------------------------------------------  Chemistries  Recent Labs  Lab 07/03/18 1613  NA 142  K 3.7  CL 101  CO2 14*  GLUCOSE 232*  BUN 26*  CREATININE 1.19*  CALCIUM 9.5  AST 103*  ALT 67*  ALKPHOS 97  BILITOT 1.4*   ------------------------------------------------------------------------------------------------------------------  Cardiac Enzymes Recent Labs  Lab 07/04/18 0949  TROPONINI <0.03   ------------------------------------------------------------------------------------------------------------------  RADIOLOGY:  Ct Head Wo Contrast  Result Date: 07/03/2018 CLINICAL DATA:  Altered level of consciousness. EXAM: CT HEAD WITHOUT CONTRAST TECHNIQUE: Contiguous axial images were obtained from the base of the skull through the vertex without intravenous contrast. COMPARISON:  01/22/2018 FINDINGS: Brain: No acute intracranial abnormality. Specifically, no hemorrhage, hydrocephalus, mass lesion, acute infarction, or significant intracranial injury. Vascular: No hyperdense vessel or unexpected calcification. Skull: No acute calvarial abnormality. Sinuses/Orbits: Visualized paranasal sinuses and mastoids clear. Orbital soft tissues unremarkable. Other: None IMPRESSION: Normal study. Electronically Signed   By: Charlett Nose M.D.   On: 07/03/2018 16:26   Ct Angio Chest Pe W/cm &/or Wo Cm  Result Date: 07/03/2018 CLINICAL DATA:  Unresponsive in car.  Tachycardia. EXAM:  CT ANGIOGRAPHY CHEST WITH CONTRAST TECHNIQUE: Multidetector CT imaging of the chest was performed using the standard protocol during bolus administration of intravenous contrast. Multiplanar CT image reconstructions and MIPs were obtained to evaluate the vascular anatomy. CONTRAST:  60mL OMNIPAQUE IOHEXOL 350 MG/ML SOLN COMPARISON:  Chest radiograph from earlier today. FINDINGS: Cardiovascular: The study is high quality for the evaluation of pulmonary embolism. There are no filling defects in the central, lobar, segmental or subsegmental pulmonary artery branches to suggest acute pulmonary embolism. Great vessels are normal in course and caliber. Normal heart size. No significant pericardial fluid/thickening. Mediastinum/Nodes: No discrete thyroid nodules. Unremarkable esophagus. No pathologically enlarged axillary, mediastinal or hilar lymph nodes. Lungs/Pleura: No pneumothorax. No pleural effusion. No acute consolidative airspace disease, lung masses or significant pulmonary nodules. Upper abdomen: Punctate calcifications throughout the limited visualized portions of the pancreas, which is incompletely evaluated. Musculoskeletal: No aggressive appearing focal osseous lesions. Moderate thoracic spondylosis. Review of the MIP images confirms the above findings. IMPRESSION: 1. No pulmonary embolism.  No active pulmonary disease. 2. Punctate calcifications throughout the limited visualized pancreas, compatible with chronic pancreatitis. Pancreas is incompletely evaluated on this scan. Electronically Signed   By: Delbert Phenix M.D.   On: 07/03/2018 19:07   Dg Chest Port 1 View  Result Date: 07/03/2018 CLINICAL DATA:  Acute shortness of breath and tachycardia. EXAM: PORTABLE CHEST 1 VIEW COMPARISON:  11/07/2009 chest radiograph FINDINGS: The cardiomediastinal silhouette is unremarkable. Defibrillator pads overlying the chest noted. There is no evidence of focal airspace disease, pulmonary edema, suspicious pulmonary  nodule/mass, pleural effusion, or pneumothorax. No acute bony abnormalities are identified. IMPRESSION: No active disease. Electronically Signed   By: Harmon Pier M.D.   On: 07/03/2018 16:33    EKG:   Orders placed or performed during the hospital encounter of 07/03/18  . EKG 12-Lead  . EKG 12-Lead  . EKG 12-Lead  . EKG 12-Lead  . Repeat EKG  . Repeat EKG  . EKG 12-Lead  . EKG 12-Lead    ASSESSMENT AND PLAN:   62 year old female with past medical history significant for diabetes, hypertension and hypothyroidism and alcohol abuse presents to hospital secondary to syncopal episode.  1.  Syncope-secondary to intoxication with alcohol.  Alcohol level was greater than 260 on admission -Less likely to be cardiac.  Monitor on telemetry.  Echocardiogram has been done. -Alert and oriented now.  2.  Acute cystitis-cultures are pending.  Currently on Rocephin  3.  Hypertension-on lisinopril, hydrochlorothiazide and metoprolol  4.  Hypothyroidism-Synthroid  5.  Sinus tachycardia-secondary to dehydration and metabolic issues. -Continue IV fluids for now. -TSH within normal limits.  Low T4- increase synthroid  6.  Diabetes mellitus-A1c is pending.  Sugars are elevated.  On sliding scale insulin.  Metformin is on hold.  We will add low-dose Lantus for tonight.  7.  DVT prophylaxis-Lovenox  8.  Alcohol abuse-watch for delirium tremens.  Continue Ativan    All the records are reviewed and case discussed with Care Management/Social Workerr. Management plans discussed with the patient, family and they are in agreement.  CODE STATUS: Full code  TOTAL TIME TAKING CARE OF THIS PATIENT: 37 minutes.   POSSIBLE D/C IN 1-2 DAYS, DEPENDING ON CLINICAL CONDITION.   Enid Baas M.D on 07/04/2018 at 2:58 PM  Between 7am to 6pm - Pager - [phone]  After 6pm go to [email or web address] - password [email or web address]  [employer]  [phone]  CC: Primary care physician;  Patient, No Pcp Per

## 2018-07-04 NOTE — Progress Notes (Addendum)
Pt HR sustatining at 140 at bed and while just talking on the phone. Pt asymptomatic. Marland Kitchen. Notified [employer]. Will continue to monitor.  Update 2001: Doctor Auburn BilberryShreyang Patel ordered one time order of metropolol ( Lopressor ) IV 5 mg and was administred. Will continue to montor.

## 2018-07-04 NOTE — Care Management (Signed)
CM has made three attempts total to assess patient and she has not been available

## 2018-07-05 DIAGNOSIS — A0472 Enterocolitis due to Clostridium difficile, not specified as recurrent: Secondary | ICD-10-CM | POA: Diagnosis present

## 2018-07-05 LAB — THYROID PANEL WITH TSH
Free Thyroxine Index: 1.4 (ref 1.2–4.9)
T3 UPTAKE RATIO: 24 % (ref 24–39)
T4 TOTAL: 5.8 ug/dL (ref 4.5–12.0)
TSH: 0.332 u[IU]/mL — ABNORMAL LOW (ref 0.450–4.500)

## 2018-07-05 LAB — CBC
HCT: 28.7 % — ABNORMAL LOW (ref 35.0–47.0)
HEMOGLOBIN: 10.3 g/dL — AB (ref 12.0–16.0)
MCH: 31.7 pg (ref 26.0–34.0)
MCHC: 35.9 g/dL (ref 32.0–36.0)
MCV: 88.3 fL (ref 80.0–100.0)
PLATELETS: 61 10*3/uL — AB (ref 150–440)
RBC: 3.25 MIL/uL — ABNORMAL LOW (ref 3.80–5.20)
RDW: 13.9 % (ref 11.5–14.5)
WBC: 2.4 10*3/uL — ABNORMAL LOW (ref 3.6–11.0)

## 2018-07-05 LAB — HIV ANTIBODY (ROUTINE TESTING W REFLEX): HIV SCREEN 4TH GENERATION: NONREACTIVE

## 2018-07-05 LAB — BASIC METABOLIC PANEL
Anion gap: 13 (ref 5–15)
Anion gap: 8 (ref 5–15)
BUN: 10 mg/dL (ref 8–23)
BUN: 12 mg/dL (ref 8–23)
CALCIUM: 8.2 mg/dL — AB (ref 8.9–10.3)
CHLORIDE: 97 mmol/L — AB (ref 98–111)
CO2: 26 mmol/L (ref 22–32)
CO2: 24 mmol/L (ref 22–32)
CREATININE: 0.64 mg/dL (ref 0.44–1.00)
CREATININE: 0.95 mg/dL (ref 0.44–1.00)
Calcium: 8.3 mg/dL — ABNORMAL LOW (ref 8.9–10.3)
Chloride: 99 mmol/L (ref 98–111)
GFR calc Af Amer: >60 mL/min (ref >60)
GFR calc Af Amer: >60 mL/min (ref >60)
GFR calc non Af Amer: >60 mL/min (ref >60)
GLUCOSE: 132 mg/dL — AB (ref 70–99)
GLUCOSE: 262 mg/dL — AB (ref 70–99)
POTASSIUM: 2.3 mmol/L — AB (ref 3.5–5.1)
Potassium: 3.1 mmol/L — ABNORMAL LOW (ref 3.5–5.1)
SODIUM: 136 mmol/L (ref 135–145)
SODIUM: 131 mmol/L — AB (ref 135–145)

## 2018-07-05 LAB — GLUCOSE, CAPILLARY
GLUCOSE-CAPILLARY: 253 mg/dL — AB (ref 70–99)
GLUCOSE-CAPILLARY: 180 mg/dL — AB (ref 70–99)
Glucose-Capillary: 134 mg/dL — ABNORMAL HIGH (ref 70–99)
Glucose-Capillary: 166 mg/dL — ABNORMAL HIGH (ref 70–99)
Glucose-Capillary: 195 mg/dL — ABNORMAL HIGH (ref 70–99)

## 2018-07-05 LAB — ECHOCARDIOGRAM COMPLETE
Height: 62 in
WEIGHTICAEL: 1693.13 [oz_av]

## 2018-07-05 LAB — HEMOGLOBIN A1C
HEMOGLOBIN A1C: 8.6 % — AB (ref 4.8–5.6)
Mean Plasma Glucose: 200 mg/dL

## 2018-07-05 LAB — MAGNESIUM: Magnesium: 1.1 mg/dL — ABNORMAL LOW (ref 1.7–2.4)

## 2018-07-05 MED ORDER — POTASSIUM CHLORIDE 20 MEQ PO PACK
40.0000 meq | PACK | Freq: Once | ORAL | Status: AC
  Administered 2018-07-05: 40 meq via ORAL
  Filled 2018-07-05: qty 2

## 2018-07-05 MED ORDER — METOPROLOL TARTRATE 50 MG PO TABS
50.0000 mg | ORAL_TABLET | Freq: Two times a day (BID) | ORAL | Status: DC
  Administered 2018-07-05 – 2018-07-06 (×3): 50 mg via ORAL
  Filled 2018-07-05 (×3): qty 1

## 2018-07-05 MED ORDER — MAGNESIUM SULFATE 4 GM/100ML IV SOLN
4.0000 g | Freq: Once | INTRAVENOUS | Status: AC
  Administered 2018-07-05: 4 g via INTRAVENOUS
  Filled 2018-07-05: qty 100

## 2018-07-05 MED ORDER — POTASSIUM CHLORIDE 10 MEQ/100ML IV SOLN
10.0000 meq | INTRAVENOUS | Status: AC
  Administered 2018-07-05 (×4): 10 meq via INTRAVENOUS
  Filled 2018-07-05 (×4): qty 100

## 2018-07-05 MED ORDER — POTASSIUM CHLORIDE CRYS ER 20 MEQ PO TBCR
40.0000 meq | EXTENDED_RELEASE_TABLET | ORAL | Status: AC
  Administered 2018-07-05 (×2): 40 meq via ORAL
  Filled 2018-07-05 (×2): qty 2

## 2018-07-05 NOTE — Progress Notes (Signed)
[employer] [facility] -  at [facility]   PATIENT NAME: Shannon Herrera    MR#:  [id number]  DATE OF BIRTH:  [birth year]  SUBJECTIVE:  CHIEF COMPLAINT:   Chief Complaint  Patient presents with  . Altered Mental Status   -Still has diarrhea.  Diagnosed with C. difficile -Anxiety is improving                                        REVIEW OF SYSTEMS:  Review of Systems  Constitutional: Negative for chills, fever and malaise/fatigue.  HENT: Negative for congestion, ear discharge, hearing loss and nosebleeds.   Eyes: Negative for blurred vision and double vision.  Respiratory: Negative for cough, shortness of breath and wheezing.   Cardiovascular: Negative for chest pain and palpitations.  Gastrointestinal: Positive for nausea. Negative for abdominal pain, constipation, diarrhea and vomiting.  Genitourinary: Negative for dysuria.  Neurological: Negative for dizziness, focal weakness, seizures, weakness and headaches.  Psychiatric/Behavioral: The patient is nervous/anxious.     DRUG ALLERGIES:  No Known Allergies  VITALS:  Blood pressure 137/84, pulse (!) 112, temperature 98.4 F (36.9 C), temperature source Oral, resp. rate 18, height 5\' 2"  (1.575 m), weight 48.6 kg, SpO2 100 %.  PHYSICAL EXAMINATION:  Physical Exam  GENERAL:  62 y.o.-year-old patient lying in the bed with no acute distress.  EYES: Pupils equal, round, reactive to light and accommodation. No scleral icterus. Extraocular muscles intact.  HEENT: Head atraumatic, normocephalic. Oropharynx and nasopharynx clear.  NECK:  Supple, no jugular venous distention. No thyroid enlargement, no tenderness.  LUNGS: Normal breath sounds bilaterally, no wheezing, rales,rhonchi or crepitation. No use of accessory muscles of respiration.  CARDIOVASCULAR: S1, S2 normal.  Rapid rate and normal rhythm.  No murmurs, rubs, or gallops.  ABDOMEN: Soft, nontender, nondistended. Bowel sounds present. No organomegaly or mass.    EXTREMITIES: No pedal edema, cyanosis, or clubbing.  NEUROLOGIC: Cranial nerves II through XII are intact. Muscle strength 5/5 in all extremities. Sensation intact. Gait not checked.  PSYCHIATRIC: The patient is alert and oriented x 3.   SKIN: No obvious rash, lesion, or ulcer.    LABORATORY PANEL:   CBC Recent Labs  Lab 07/05/18 0524  WBC 2.4*  HGB 10.3*  HCT 28.7*  PLT 61*   ------------------------------------------------------------------------------------------------------------------  Chemistries  Recent Labs  Lab 07/03/18 1613 07/05/18 0524  NA 142 136  K 3.7 2.3*  CL 101 97*  CO2 14* 26  GLUCOSE 232* 132*  BUN 26* 10  CREATININE 1.19* 0.64  CALCIUM 9.5 8.3*  MG  --  1.1*  AST 103*  --   ALT 67*  --   ALKPHOS 97  --   BILITOT 1.4*  --    ------------------------------------------------------------------------------------------------------------------  Cardiac Enzymes Recent Labs  Lab 07/04/18 0949  TROPONINI <0.03   ------------------------------------------------------------------------------------------------------------------  RADIOLOGY:  Ct Head Wo Contrast  Result Date: 07/03/2018 CLINICAL DATA:  Altered level of consciousness. EXAM: CT HEAD WITHOUT CONTRAST TECHNIQUE: Contiguous axial images were obtained from the base of the skull through the vertex without intravenous contrast. COMPARISON:  01/22/2018 FINDINGS: Brain: No acute intracranial abnormality. Specifically, no hemorrhage, hydrocephalus, mass lesion, acute infarction, or significant intracranial injury. Vascular: No hyperdense vessel or unexpected calcification. Skull: No acute calvarial abnormality. Sinuses/Orbits: Visualized paranasal sinuses and mastoids clear. Orbital soft tissues unremarkable. Other: None IMPRESSION: Normal study. Electronically Signed   By: Charlett Nose  M.D.   On: 07/03/2018 16:26   Ct Angio Chest Pe W/cm &/or Wo Cm  Result Date: 07/03/2018 CLINICAL DATA:   Unresponsive in car.  Tachycardia. EXAM: CT ANGIOGRAPHY CHEST WITH CONTRAST TECHNIQUE: Multidetector CT imaging of the chest was performed using the standard protocol during bolus administration of intravenous contrast. Multiplanar CT image reconstructions and MIPs were obtained to evaluate the vascular anatomy. CONTRAST:  60mL OMNIPAQUE IOHEXOL 350 MG/ML SOLN COMPARISON:  Chest radiograph from earlier today. FINDINGS: Cardiovascular: The study is high quality for the evaluation of pulmonary embolism. There are no filling defects in the central, lobar, segmental or subsegmental pulmonary artery branches to suggest acute pulmonary embolism. Great vessels are normal in course and caliber. Normal heart size. No significant pericardial fluid/thickening. Mediastinum/Nodes: No discrete thyroid nodules. Unremarkable esophagus. No pathologically enlarged axillary, mediastinal or hilar lymph nodes. Lungs/Pleura: No pneumothorax. No pleural effusion. No acute consolidative airspace disease, lung masses or significant pulmonary nodules. Upper abdomen: Punctate calcifications throughout the limited visualized portions of the pancreas, which is incompletely evaluated. Musculoskeletal: No aggressive appearing focal osseous lesions. Moderate thoracic spondylosis. Review of the MIP images confirms the above findings. IMPRESSION: 1. No pulmonary embolism.  No active pulmonary disease. 2. Punctate calcifications throughout the limited visualized pancreas, compatible with chronic pancreatitis. Pancreas is incompletely evaluated on this scan. Electronically Signed   By: Delbert Phenix M.D.   On: 07/03/2018 19:07   Dg Chest Port 1 View  Result Date: 07/03/2018 CLINICAL DATA:  Acute shortness of breath and tachycardia. EXAM: PORTABLE CHEST 1 VIEW COMPARISON:  11/07/2009 chest radiograph FINDINGS: The cardiomediastinal silhouette is unremarkable. Defibrillator pads overlying the chest noted. There is no evidence of focal airspace  disease, pulmonary edema, suspicious pulmonary nodule/mass, pleural effusion, or pneumothorax. No acute bony abnormalities are identified. IMPRESSION: No active disease. Electronically Signed   By: Harmon Pier M.D.   On: 07/03/2018 16:33    EKG:   Orders placed or performed during the hospital encounter of 07/03/18  . EKG 12-Lead  . EKG 12-Lead  . EKG 12-Lead  . EKG 12-Lead  . Repeat EKG  . Repeat EKG  . EKG 12-Lead  . EKG 12-Lead    ASSESSMENT AND PLAN:   62 year old female with past medical history significant for diabetes, hypertension and hypothyroidism and alcohol abuse presents to hospital secondary to syncopal episode.  1.  C. difficile colitis-added probiotics.  On oral vancomycin -Monitor diarrhea  2.  Hypokalemia and hypomagnesemia-electrolytes being replaced.  Pharmacy adjusting electrolytes  3.  Hypertension-on metoprolol  4.  Hypothyroidism-Synthroid  5.  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  Seems to be at baseline  6.  Diabetes mellitus-A1c is 8.6.  Sugars are elevated.  On sliding scale insulin.  Metformin is on hold.  I did low-dose Lantus for now.  7.  DVT prophylaxis-Lovenox-start platelets while on Lovenox  8.  Alcohol abuse-watch for delirium tremens.  Continue Ativan  Encourage ambulation.  Possible discharge tomorrow   All the records are reviewed and case discussed with Care Management/Social Workerr. Management plans discussed with the patient, family and they are in agreement.  CODE STATUS: Full code  TOTAL TIME TAKING CARE OF THIS PATIENT: 37 minutes.   POSSIBLE D/C IN 1-2 DAYS, DEPENDING ON CLINICAL CONDITION.   Enid Baas M.D on 07/05/2018 at 2:15 PM  Between 7am to 6pm - Pager - [phone]  After 6pm go to [email or web address] - password [email or web address]  [employer]  Office  [phone]  CC: Primary care physician; Patient, No  Pcp Per

## 2018-07-05 NOTE — Plan of Care (Signed)

## 2018-07-05 NOTE — Progress Notes (Signed)
MEDICATION RELATED CONSULT NOTE - INITIAL   Pharmacy Consult for electrolyte management Indication: hypokalemia, hypomagnesemia  No Known Allergies  Patient Measurements: Height: 5\' 2"  (157.5 cm) Weight: 107 lb 3.2 oz (48.6 kg) IBW/kg (Calculated) : 50.1 Adjusted Body Weight:   Vital Signs: Temp: 98.4 F (36.9 C) (09/24 0742) Temp Source: Oral (09/24 0742) BP: 137/84 (09/24 0742) Pulse Rate: 112 (09/24 0742) Intake/Output from previous day: 09/23 0701 - 09/24 0700 In: 2120.8 [P.O.:360; I.V.:1660.8; IV Piggyback:100] Out: 1352 [Urine:1350; Stool:2] Intake/Output from this shift: No intake/output data recorded.  Labs: Recent Labs    07/03/18 1613 07/05/18 0524  WBC 10.1 2.4*  HGB 12.0 10.3*  HCT 35.0 28.7*  PLT 158 61*  CREATININE 1.19* 0.64  MG  --  1.1*  ALBUMIN 4.6  --   PROT 8.3*  --   AST 103*  --   ALT 67*  --   ALKPHOS 97  --   BILITOT 1.4*  --    Estimated Creatinine Clearance: 55.2 mL/min (by C-G formula based on SCr of 0.64 mg/dL).   Microbiology: Recent Results (from the past 720 hour(s))  Urine culture     Status: Abnormal (Preliminary result)   Collection Time: 07/03/18  4:13 PM  Result Value Ref Range Status   Specimen Description   Final    URINE, RANDOM Performed at [facility], [address], [place], [place] [address]    Special Requests   Final    NONE Performed at [facility], [address], [place], [place] [address]    Culture >=100,000 COLONIES/mL GRAM NEGATIVE RODS (A)  Final   Report Status PENDING  Incomplete  MRSA PCR Screening     Status: None   Collection Time: 07/03/18  9:54 PM  Result Value Ref Range Status   MRSA by PCR NEGATIVE NEGATIVE Final    Comment:        The GeneXpert MRSA Assay (FDA approved for NASAL specimens only), is one component of a comprehensive MRSA colonization surveillance program. It is not intended to diagnose MRSA infection nor to guide or monitor treatment for MRSA  infections. Performed at [facility], [address] [place], [place] [address]   C difficile quick scan w PCR reflex     Status: Abnormal   Collection Time: 07/04/18  1:45 PM  Result Value Ref Range Status   C Diff antigen POSITIVE (A) NEGATIVE Final   C Diff toxin NEGATIVE NEGATIVE Final   C Diff interpretation Results are indeterminate. See PCR results.  Final    Comment: Performed at [facility], [address] [place], [place] [address]  C. Diff by PCR, Reflexed     Status: Abnormal   Collection Time: 07/04/18  1:45 PM  Result Value Ref Range Status   Toxigenic C. Difficile by PCR POSITIVE (A) NEGATIVE Final    Comment: Positive for toxigenic C. difficile with little to no toxin production. Only treat if clinical presentation suggests symptomatic illness. Performed at [facility], [address], [place], [place] [address]     Medical History: Past Medical History:  Diagnosis Date  . Diabetes mellitus without complication (HCC)   . Hypertension   . Hypothyroid     Medications:  Infusions:  . sodium chloride 60 mL/hr at 07/05/18 0736  . magnesium sulfate 1 - 4 g bolus IVPB      Assessment: 62 yof cc AMS/syncope after heavy alcohol intake. PMH DM, HTN, hypothyroidism. Electrolytes noted to be  low. Pharmacy consulted to help manage electrolyte replacement  ELECTROLYTE COURSE Potassium Chloride 40 mEq po x 1 9/24 08:34    10 mEq IV x 1 9/24 08:37    10 mEq IV x 1 9/24 10:26    10 mEq IV x 1 9/24 12:09    40 mEq po x 1 9/24 12:12   Magnesium sulfate   Goal of Therapy:  K 3.5 to 5 Ca 8.9 to 10.3 Mg 1.7 to 2.4 Phos 2.5 to 4.6  Plan:  Patient has received KCL 110 mEq replacement this morning. Give magnesium sulfate 4 gm IV x 1. Recheck BMP this evening, and recheck all electrolytes tomorrow with AM labs.   Carola FrostNathan A Shaman Muscarella, Pharm.D., BCPS Clinical Pharmacist 07/05/2018,12:16 PM

## 2018-07-05 NOTE — Progress Notes (Signed)
Lab called and reported a critical value of 2.5 potassium. Notify Location managerdocotr Kalisetti and incoming nurse. Will continue to monitor.

## 2018-07-05 NOTE — Progress Notes (Signed)
MEDICATION RELATED CONSULT NOTE - INITIAL   Pharmacy Consult for electrolyte management Indication: hypokalemia, hypomagnesemia  No Known Allergies  Patient Measurements: Height: 5\' 2"  (157.5 cm) Weight: 107 lb 3.2 oz (48.6 kg) IBW/kg (Calculated) : 50.1 Adjusted Body Weight:   Vital Signs: Temp: 98.6 F (37 C) (09/24 1543) Temp Source: Oral (09/24 1543) BP: 129/75 (09/24 1543) Pulse Rate: 92 (09/24 1543) Intake/Output from previous day: 09/23 0701 - 09/24 0700 In: 2120.8 [P.O.:360; I.V.:1660.8; IV Piggyback:100] Out: 1352 [Urine:1350; Stool:2] Intake/Output from this shift: No intake/output data recorded.  Labs: Recent Labs    07/03/18 1613 07/05/18 0524 07/05/18 1830  WBC 10.1 2.4*  --   HGB 12.0 10.3*  --   HCT 35.0 28.7*  --   PLT 158 61*  --   CREATININE 1.19* 0.64 0.95  MG  --  1.1*  --   ALBUMIN 4.6  --   --   PROT 8.3*  --   --   AST 103*  --   --   ALT 67*  --   --   ALKPHOS 97  --   --   BILITOT 1.4*  --   --    Estimated Creatinine Clearance: 46.5 mL/min (by C-G formula based on SCr of 0.95 mg/dL).   Microbiology:   Medical History: Past Medical History:  Diagnosis Date  . Diabetes mellitus without complication (HCC)   . Hypertension   . Hypothyroid     Medications:  Infusions:  . sodium chloride 60 mL/hr at 07/05/18 0736    Assessment: 62 yof cc AMS/syncope after heavy alcohol intake. PMH DM, HTN, hypothyroidism. Electrolytes noted to be low. Pharmacy consulted to help manage electrolyte replacement  ELECTROLYTE COURSE Potassium Chloride 40 mEq po x 1 9/24 08:34    10 mEq IV x 1 9/24 08:37    10 mEq IV x 1 9/24 10:26    10 mEq IV x 1 9/24 12:09    40 mEq po x 1 9/24 12:12   Magnesium sulfate   Goal of Therapy:  K 3.5 to 5 Ca 8.9 to 10.3 Mg 1.7 to 2.4 Phos 2.5 to 4.6  Plan:  9/24 K: 3.1. Will order KCL 40mEq PO x 1 dose,   Recheck all electrolytes tomorrow with AM labs.   Aarnav Steagall M Fransheska Willingham, Pharm.D., BCPS Clinical  Pharmacist 07/05/2018,7:04 PM

## 2018-07-06 LAB — BASIC METABOLIC PANEL
Anion gap: 5 (ref 5–15)
BUN: 12 mg/dL (ref 8–23)
CO2: 25 mmol/L (ref 22–32)
Calcium: 8.2 mg/dL — ABNORMAL LOW (ref 8.9–10.3)
Chloride: 107 mmol/L (ref 98–111)
Creatinine, Ser: 0.74 mg/dL (ref 0.44–1.00)
GFR calc non Af Amer: >60 mL/min (ref >60)
Glucose, Bld: 119 mg/dL — ABNORMAL HIGH (ref 70–99)
POTASSIUM: 3.5 mmol/L (ref 3.5–5.1)
SODIUM: 137 mmol/L (ref 135–145)

## 2018-07-06 LAB — CBC
HCT: 26.4 % — ABNORMAL LOW (ref 35.0–47.0)
Hemoglobin: 9.1 g/dL — ABNORMAL LOW (ref 12.0–16.0)
MCH: 30.9 pg (ref 26.0–34.0)
MCHC: 34.4 g/dL (ref 32.0–36.0)
MCV: 89.8 fL (ref 80.0–100.0)
Platelets: 47 10*3/uL — ABNORMAL LOW (ref 150–440)
RBC: 2.94 MIL/uL — AB (ref 3.80–5.20)
RDW: 14.1 % (ref 11.5–14.5)
WBC: 2.2 10*3/uL — AB (ref 3.6–11.0)

## 2018-07-06 LAB — GLUCOSE, CAPILLARY
GLUCOSE-CAPILLARY: 225 mg/dL — AB (ref 70–99)
GLUCOSE-CAPILLARY: 157 mg/dL — AB (ref 70–99)
Glucose-Capillary: 115 mg/dL — ABNORMAL HIGH (ref 70–99)

## 2018-07-06 LAB — URINE CULTURE: Culture: >=100000 — AB

## 2018-07-06 LAB — PHOSPHORUS: PHOSPHORUS: 1.4 mg/dL — AB (ref 2.5–4.6)

## 2018-07-06 LAB — MAGNESIUM: Magnesium: 1.9 mg/dL (ref 1.7–2.4)

## 2018-07-06 MED ORDER — VANCOMYCIN 50 MG/ML ORAL SOLUTION
125.0000 mg | Freq: Four times a day (QID) | ORAL | Status: DC
  Administered 2018-07-06: 125 mg via ORAL
  Filled 2018-07-06 (×2): qty 85

## 2018-07-06 MED ORDER — POTASSIUM CHLORIDE CRYS ER 20 MEQ PO TBCR
40.0000 meq | EXTENDED_RELEASE_TABLET | Freq: Once | ORAL | Status: AC
  Administered 2018-07-06: 40 meq via ORAL
  Filled 2018-07-06: qty 2

## 2018-07-06 MED ORDER — POTASSIUM CHLORIDE CRYS ER 20 MEQ PO TBCR: 20.0000 meq | EXTENDED_RELEASE_TABLET | Freq: Every day | ORAL | 0 refills | Status: DC

## 2018-07-06 MED ORDER — METOPROLOL TARTRATE 50 MG PO TABS: 50.0000 mg | ORAL_TABLET | Freq: Two times a day (BID) | ORAL | 2 refills | Status: DC

## 2018-07-06 MED ORDER — LEVOTHYROXINE SODIUM 100 MCG PO TABS: 100.0000 ug | ORAL_TABLET | Freq: Every day | ORAL | 2 refills | Status: DC

## 2018-07-06 MED ORDER — VANCOMYCIN 50 MG/ML ORAL SOLUTION: 125.0000 mg | Freq: Four times a day (QID) | ORAL | 0 refills | Status: AC

## 2018-07-06 MED ORDER — K PHOS MONO-SOD PHOS DI & MONO 155-852-130 MG PO TABS
500.0000 mg | ORAL_TABLET | ORAL | Status: DC
  Administered 2018-07-06 (×3): 500 mg via ORAL
  Filled 2018-07-06 (×4): qty 2

## 2018-07-06 MED ORDER — POTASSIUM CHLORIDE CRYS ER 20 MEQ PO TBCR: 40.0000 meq | EXTENDED_RELEASE_TABLET | Freq: Once | ORAL | Status: DC

## 2018-07-06 NOTE — Plan of Care (Signed)

## 2018-07-06 NOTE — Progress Notes (Addendum)
MEDICATION RELATED CONSULT NOTE - INITIAL   Pharmacy Consult for electrolyte management Indication: hypokalemia, hypomagnesemia  No Known Allergies  Patient Measurements: Height: 5\' 2"  (157.5 cm) Weight: 107 lb 3.2 oz (48.6 kg) IBW/kg (Calculated) : 50.1 Adjusted Body Weight:   Vital Signs: Temp: 98.1 F (36.7 C) (09/25 0402) Temp Source: Oral (09/25 0402) BP: 143/89 (09/25 0402) Pulse Rate: 88 (09/25 0402) Intake/Output from previous day: 09/24 0701 - 09/25 0700 In: 1039 [I.V.:939; IV Piggyback:100] Out: 0  Intake/Output from this shift: No intake/output data recorded.  Labs: Recent Labs    07/03/18 1613 07/05/18 0524 07/05/18 1830 07/06/18 0547  WBC 10.1 2.4*  --  2.2*  HGB 12.0 10.3*  --  9.1*  HCT 35.0 28.7*  --  26.4*  PLT 158 61*  --  47*  CREATININE 1.19* 0.64 0.95 0.74  MG  --  1.1*  --  1.9  PHOS  --   --   --  1.4*  ALBUMIN 4.6  --   --   --   PROT 8.3*  --   --   --   AST 103*  --   --   --   ALT 67*  --   --   --   ALKPHOS 97  --   --   --   BILITOT 1.4*  --   --   --    Estimated Creatinine Clearance: 55.2 mL/min (by C-G formula based on SCr of 0.74 mg/dL).   Microbiology:   Medical History: Past Medical History:  Diagnosis Date  . Diabetes mellitus without complication (HCC)   . Hypertension   . Hypothyroid     Medications:  Infusions:  . sodium chloride 60 mL/hr at 07/06/18 0452    Assessment: 62 yof cc AMS/syncope after heavy alcohol intake. PMH DM, HTN, hypothyroidism. Electrolytes noted to be low. Pharmacy consulted to help manage electrolyte replacement  ELECTROLYTE COURSE Potassium Chloride 40 mEq po x 1 9/24 08:34    10 mEq IV x 1 9/24 08:37    10 mEq IV x 1 9/24 10:26    10 mEq IV x 1 9/24 12:09    40 mEq po x 1 9/24 12:12    10 mEq IV x 1 9/24 13:24    40 mEq po x 1 9/24 19:34    40 mEq po x 1 9/25 08:10   Magnesium sulfate 4 gm IV x 1 9/24 14:14  K Phos Neutral  Goal of Therapy:  K 3.5 to 5 Ca 8.9 to 10.3 Mg  1.7 to 2.4 Phos 2.5 to 4.6  Plan:  This morning K 3.5 and phos noted to be low at 1.4. Will give klor-con 40 mEq po x 1, Klor-Con 40 mEq po at 20:00, and K Phos Neutral 500 mg po Q4H x 4 doses. Recheck all electrolytes tomorrow with AM labs.   Shannon FrostNathan A Tulio Facundo, Pharm.D., BCPS Clinical Pharmacist 07/06/2018,8:11 AM

## 2018-07-06 NOTE — Care Management Note (Signed)
Case Management Note  Patient Details  Name: Shannon Herrera MRN: [id number] Date of Birth: Jun 04, 1956  Subjective/Objective:  RNCM consult received for uninsured patient. [facility] given [facility]. Email sent to [employer] at [facility]. Patient works and will be returning to work so does not want to do outpatient PT at this time.                   Action/Plan:   Expected Discharge Date:  07/06/18               Expected Discharge Plan:  Home/Self Care  In-House Referral:     Discharge planning Services  CM Consult, Indigent Health Clinic, Medication Assistance  Post Acute Care Choice:    Choice offered to:  Patient  DME Arranged:    DME Agency:     HH Arranged:    HH Agency:     Status of Service:  Completed, signed off  If discussed at [employer], dates discussed:    Additional Comments:  Marily Memos, RN 07/06/2018, 1:49 PM

## 2018-07-06 NOTE — Progress Notes (Signed)
     Shannon Herrera was admitted to the [facility] on 07/03/2018 for an acute medical condition and is being Discharged on  07/06/2018 .  She will need another 3-4 days for recovery and so advised to stay away from work until then. So please excuse  her from work for the above  Days. Should be able to return to work without any restrictions from 07/11/18.  Call Shannon Baasadhika Mattilynn Forrer  MD, [facility] at  [phone] with questions.  Shannon BaasKALISETTI,Shannon Herrera on 07/06/2018,at 12:05 PM  [facility] [address], [place] [id number]

## 2018-07-06 NOTE — Evaluation (Signed)
Physical Therapy Evaluation Patient Details Name: Shannon Herrera MRN: [id number] DOB: 1955-12-03 Today's Date: 07/06/2018   History of Present Illness   Pt is a 62 y.o. female with a known history of diabetes type 2, hypertension, hypothyroidism who was drinking heavily she stated that she drank more than a bottle of wine and was in her car when she was found passed out.  She was brought to the ED and her heart rate continued to be elevated.  Rest of her evaluation is negative.  Assessment includes: C-diff, hypokalemia, hypomagnesemia, HTN, hypothyroid, pancytopenia, alcohol abuse, and DM.    Clinical Impression  Pt presents with mild deficits in strength, gait, and balance.  Pt was Ind with good speed and effort with bed mobility and transfers.  Pt was able to amb 100' without an AD with overall good stability but did present with significantly decreased cadence while making turns with short, cautious steps.  Pt reports feeling less steady and weaker than at baseline.  Pt's SpO2 remained at 100% throughout the session with HR ranging from high 90s at rest to low 100s with activity.  Pt will benefit from OPPT services upon discharge to safely address above deficits for decreased risk of functional decline and return to PLOF.      Follow Up Recommendations Outpatient PT    Equipment Recommendations  None recommended by PT    Recommendations for Other Services       Precautions / Restrictions Precautions Precautions: Fall Restrictions Weight Bearing Restrictions: No      Mobility  Bed Mobility Overal bed mobility: Independent                Transfers Overall transfer level: Independent                  Ambulation/Gait Ambulation/Gait assistance: Supervision Gait Distance (Feet): 100 Feet Assistive device: None Gait Pattern/deviations: Step-through pattern;Decreased step length - right;Decreased step length - left Gait velocity: Decreased   General Gait  Details: Overall good stability but cautious and slow during 180 deg turns  Information systems manager [employer] (Stroke Patients Only)       Balance Overall balance assessment: Needs assistance   Sitting balance-Leahy Scale: Normal     Standing balance support: No upper extremity supported;During functional activity Standing balance-Leahy Scale: Good Standing balance comment: Pt slow and cautious with 180 deg turns during amb Single Leg Stance - Right Leg: 8 Single Leg Stance - Left Leg: 10           High Level Balance Comments: Pt steady with feet together with eyes open and closed             Pertinent Vitals/Pain Pain Assessment: No/denies pain    Home Living Family/patient expects to be discharged to:: Private residence Living Arrangements: Alone Available Help at Discharge: Family;Available PRN/intermittently Type of Home: House Home Access: Stairs to enter Entrance Stairs-Rails: None Entrance Stairs-Number of Steps: 3 Home Layout: Two level;Able to live on main level with bedroom/bathroom Home Equipment: None      Prior Function Level of Independence: Independent         Comments: Ind amb community distances without AD, Ind with all ADLs, one fall in the last year     Hand Dominance        Extremity/Trunk Assessment   Upper Extremity Assessment Upper Extremity Assessment: Overall WFL for tasks assessed  Lower Extremity Assessment Lower Extremity Assessment: Generalized weakness       Communication   Communication: No difficulties  Cognition Arousal/Alertness: Awake/alert Behavior During Therapy: WFL for tasks assessed/performed Overall Cognitive Status: Within Functional Limits for tasks assessed                                        General Comments      Exercises Other Exercises Other Exercises: Corner balance education/progression and practice with emphasis on need for spotter  while performing as HEP   Assessment/Plan    PT Assessment Patient needs continued PT services  PT Problem List Decreased strength;Decreased balance       PT Treatment Interventions Gait training;Stair training;Balance training;Therapeutic exercise;Therapeutic activities;Patient/family education    PT Goals (Current goals can be found in the Care Plan section)  Acute Rehab PT Goals Patient Stated Goal: To get stronger PT Goal Formulation: With patient Time For Goal Achievement: 07/19/18 Potential to Achieve Goals: Good    Frequency Min 2X/week   Barriers to discharge        Co-evaluation               AM-PAC PT "6 Clicks" Daily Activity  Outcome Measure Difficulty turning over in bed (including adjusting bedclothes, sheets and blankets)?: None Difficulty moving from lying on back to sitting on the side of the bed? : None Difficulty sitting down on and standing up from a chair with arms (e.g., wheelchair, bedside commode, etc,.)?: None Help needed moving to and from a bed to chair (including a wheelchair)?: None Help needed walking in hospital room?: A Little Help needed climbing 3-5 steps with a railing? : A Little 6 Click Score: 22    End of Session Equipment Utilized During Treatment: Gait belt Activity Tolerance: Patient tolerated treatment well Patient left: in bed;with bed alarm set;with call bell/phone within reach Nurse Communication: Mobility status PT Visit Diagnosis: Unsteadiness on feet (R26.81);Muscle weakness (generalized) (M62.81);Difficulty in walking, not elsewhere classified (R26.2)    Time: 1610-9604 PT Time Calculation (min) (ACUTE ONLY): 25 min   Charges:   PT Evaluation $PT Eval Low Complexity: 1 Low PT Treatments $Therapeutic Exercise: 8-22 mins        D. Scott Jonthan Leite PT, DPT 07/06/18, 10:27 AM

## 2018-07-07 NOTE — Discharge Summary (Signed)
[employer] [facility] [place] [facility]   PATIENT NAME: Shannon Herrera    MR#:  [id number]  DATE OF BIRTH:  01-Jul-1956  DATE OF ADMISSION:  07/03/2018   ADMITTING PHYSICIAN: Auburn Bilberry, MD  DATE OF DISCHARGE: 07/06/2018  6:57 PM  PRIMARY CARE PHYSICIAN: Patient, No Pcp Per   ADMISSION DIAGNOSIS:   Tachycardia [R00.0] Alcoholic intoxication with complication (HCC) [F10.929] Urinary tract infection without hematuria, site unspecified [N39.0]  DISCHARGE DIAGNOSIS:   Active Problems:   Syncope and collapse   C. difficile colitis   SECONDARY DIAGNOSIS:   Past Medical History:  Diagnosis Date  . Diabetes mellitus without complication (HCC)   . Hypertension   . Hypothyroid     HOSPITAL COURSE:   62 year old female with past medical history significant for diabetes, hypertension and hypothyroidism and alcohol abuse presents to hospital secondary to syncopal episode.  1.  C. difficile colitis-improved and started on oral vancomycin.  Will be discharged on the same.  Added probiotics.    2.  Hypokalemia and hypomagnesemia-electrolytes replaced.  Second due to diarrhea.  Improved after replacing.   3.  Hypertension-on metoprolol  4.  Hypothyroidism-Synthroid  5.  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  Seems to be at baseline -Did not receive any transfusion this admission  6.  Diabetes mellitus-A1c is 8.6.    On metformin.  Low-carb diet recommended.  7.  Alcohol abuse-intoxicated on admission.  Did not go through withdrawals or DTs.  Did require Ativan as needed in the hospital. -Counseled prior to discharge again.  Ambulating well.  Will be discharged home with home health  DISCHARGE CONDITIONS:   Guarded  CONSULTS OBTAINED:   None  DRUG ALLERGIES:   No Known Allergies DISCHARGE MEDICATIONS:   Allergies as of 07/06/2018   No Known Allergies     Medication List    STOP taking these medications   chlordiazePOXIDE 10 MG capsule Commonly known as:  LIBRIUM   lisinopril-hydrochlorothiazide 20-25 MG tablet Commonly known as:  PRINZIDE,ZESTORETIC   traZODone 50 MG tablet Commonly known as:  DESYREL     TAKE these medications   levothyroxine 100 MCG tablet Commonly known as:  SYNTHROID, LEVOTHROID Take 1 tablet (100 mcg total) by mouth daily before breakfast. What changed:  Another medication with the same name was removed. Continue taking this medication, and follow the directions you see here.   lovastatin 20 MG tablet Commonly known as:  MEVACOR Take 40 mg by mouth at bedtime.   metFORMIN 1000 MG tablet Commonly known as:  GLUCOPHAGE Take 1 tablet (1,000 mg total) by mouth 2 (two) times daily with a meal.   metoprolol tartrate 50 MG tablet Commonly known as:  LOPRESSOR Take 1 tablet (50 mg total) by mouth 2 (two) times daily.   ondansetron 4 MG disintegrating tablet Commonly known as:  ZOFRAN-ODT Take 1 tablet (4 mg total) by mouth every 8 (eight) hours as needed for nausea or vomiting.   potassium chloride SA 20 MEQ tablet Commonly known as:  K-DUR,KLOR-CON Take 1 tablet (20 mEq total) by mouth daily.   sertraline 25 MG tablet Commonly known as:  ZOLOFT Take 1 tablet (25 mg total) by mouth daily.   vancomycin 50 mg/mL  oral solution Commonly known as:  VANCOCIN Take 2.5 mLs (125 mg total) by mouth 4 (four) times daily for 10 days.        DISCHARGE INSTRUCTIONS:   1.  PCP follow-up in 1 to 2 weeks  DIET:   Cardiac diet  ACTIVITY:   Activity as tolerated  OXYGEN:   Home Oxygen: No.  Oxygen Delivery: room air  DISCHARGE LOCATION:   home   If you experience worsening of your admission symptoms, develop shortness of breath, life threatening emergency, suicidal or homicidal thoughts you must seek medical attention immediately by calling 911 or calling your MD immediately  if symptoms less  severe.  You Must read complete instructions/literature along with all the possible adverse reactions/side effects for all the Medicines you take and that have been prescribed to you. Take any new Medicines after you have completely understood and accpet all the possible adverse reactions/side effects.   Please note  You were cared for by a hospitalist during your hospital stay. If you have any questions about your discharge medications or the care you received while you were in the hospital after you are discharged, you can call the unit and asked to speak with the hospitalist on call if the hospitalist that took care of you is not available. Once you are discharged, your primary care physician will handle any further medical issues. Please note that NO REFILLS for any discharge medications will be authorized once you are discharged, as it is imperative that you return to your primary care physician (or establish a relationship with a primary care physician if you do not have one) for your aftercare needs so that they can reassess your need for medications and monitor your lab values.    On the day of Discharge:  VITAL SIGNS:   Blood pressure (!) 142/62, pulse 98, temperature 98 F (36.7 C), temperature source Oral, resp. rate 18, height 5\' 2"  (1.575 m), weight 48.6 kg, SpO2 100 %.  PHYSICAL EXAMINATION:    GENERAL:  62 y.o.-year-old patient lying in the bed with no acute distress.  EYES: Pupils equal, round, reactive to light and accommodation. No scleral icterus. Extraocular muscles intact.  HEENT: Head atraumatic, normocephalic. Oropharynx and nasopharynx clear.  NECK:  Supple, no jugular venous distention. No thyroid enlargement, no tenderness.  LUNGS: Normal breath sounds bilaterally, no wheezing, rales,rhonchi or crepitation. No use of accessory muscles of respiration.  CARDIOVASCULAR: S1, S2 normal. No murmurs, rubs, or gallops.  ABDOMEN: Soft, non-tender, non-distended. Bowel sounds  present. No organomegaly or mass.  EXTREMITIES: No pedal edema, cyanosis, or clubbing.  NEUROLOGIC: Cranial nerves II through XII are intact. Muscle strength 5/5 in all extremities. Sensation intact. Gait not checked.  PSYCHIATRIC: The patient is alert and oriented x 3.  SKIN: No obvious rash, lesion, or ulcer.   DATA REVIEW:   CBC Recent Labs  Lab 07/06/18 0547  WBC 2.2*  HGB 9.1*  HCT 26.4*  PLT 47*    Chemistries  Recent Labs  Lab 07/03/18 1613  07/06/18 0547  NA 142   < > 137  K 3.7   < > 3.5  CL 101   < > 107  CO2 14*   < > 25  GLUCOSE 232*   < > 119*  BUN 26*   < > 12  CREATININE 1.19*   < > 0.74  CALCIUM 9.5   < > 8.2*  MG  --    < > 1.9  AST 103*  --   --   ALT 67*  --   --   ALKPHOS 97  --   --   BILITOT 1.4*  --   --    < > =  values in this interval not displayed.     Microbiology Results  Results for orders placed or performed during the hospital encounter of 07/03/18  Urine culture     Status: Abnormal   Collection Time: 07/03/18  4:13 PM  Result Value Ref Range Status   Specimen Description   Final    URINE, RANDOM Performed at [facility], [address], [place], [place] [address]    Special Requests   Final    NONE Performed at [facility], [address] [place], [place] [address]    Culture >=100,000 COLONIES/mL ESCHERICHIA COLI (A)  Final   Report Status 07/06/2018 FINAL  Final   Organism ID, Bacteria ESCHERICHIA COLI (A)  Final      Susceptibility   Escherichia coli - MIC*    AMPICILLIN <=2 SENSITIVE Sensitive     CEFAZOLIN <=4 SENSITIVE Sensitive     CEFTRIAXONE <=1 SENSITIVE Sensitive     CIPROFLOXACIN <=0.25 SENSITIVE Sensitive     GENTAMICIN <=1 SENSITIVE Sensitive     IMIPENEM <=0.25 SENSITIVE Sensitive     NITROFURANTOIN <=16 SENSITIVE Sensitive     TRIMETH/SULFA <=20 SENSITIVE Sensitive     AMPICILLIN/SULBACTAM <=2 SENSITIVE Sensitive     PIP/TAZO <=4 SENSITIVE Sensitive     Extended ESBL NEGATIVE  Sensitive     * >=100,000 COLONIES/mL ESCHERICHIA COLI  MRSA PCR Screening     Status: None   Collection Time: 07/03/18  9:54 PM  Result Value Ref Range Status   MRSA by PCR NEGATIVE NEGATIVE Final    Comment:        The GeneXpert MRSA Assay (FDA approved for NASAL specimens only), is one component of a comprehensive MRSA colonization surveillance program. It is not intended to diagnose MRSA infection nor to guide or monitor treatment for MRSA infections. Performed at [facility], [address] [place], [place] [address]   C difficile quick scan w PCR reflex     Status: Abnormal   Collection Time: 07/04/18  1:45 PM  Result Value Ref Range Status   C Diff antigen POSITIVE (A) NEGATIVE Final   C Diff toxin NEGATIVE NEGATIVE Final   C Diff interpretation Results are indeterminate. See PCR results.  Final    Comment: Performed at [facility], [address] [place], [place] [address]  C. Diff by PCR, Reflexed     Status: Abnormal   Collection Time: 07/04/18  1:45 PM  Result Value Ref Range Status   Toxigenic C. Difficile by PCR POSITIVE (A) NEGATIVE Final    Comment: Positive for toxigenic C. difficile with little to no toxin production. Only treat if clinical presentation suggests symptomatic illness. Performed at [facility], [address] [place], [place] [address]     RADIOLOGY:  No results found.   Management plans discussed with the patient, family and they are in agreement.  CODE STATUS:  Code Status History    Date Active Date Inactive Code Status Order ID Comments User Context   07/03/2018 2146 07/06/2018 2202 Full Code [id number]  Auburn Bilberry, MD Inpatient   01/22/2018 2328 01/25/2018 1435 Full Code [id number]  Levie Heritage, DO Inpatient      TOTAL TIME TAKING CARE OF THIS PATIENT:38  minutes.    Enid Baas M.D on 07/07/2018 at 3:27 PM  Between 7am to 6pm - Pager - [phone]  After 6pm go to [email or web address] -  Social research officer, government  [employer]  [phone]  CC: Primary care physician; Patient, No Pcp Per   Note: This dictation was prepared with Dragon dictation along with smaller phrase technology. Any transcriptional errors that result from this process are unintentional.

## 2019-02-05 ENCOUNTER — Emergency Department (HOSPITAL_COMMUNITY): Payer: Self-pay

## 2019-02-05 ENCOUNTER — Inpatient Hospital Stay (HOSPITAL_COMMUNITY)
Admission: EM | Admit: 2019-02-05 | Discharge: 2019-02-08 | DRG: 100 | Disposition: A | Discharge status: Home Health | Payer: Self-pay | Attending: Internal Medicine | Admitting: Internal Medicine

## 2019-02-05 ENCOUNTER — Inpatient Hospital Stay (HOSPITAL_COMMUNITY): Payer: Self-pay

## 2019-02-05 DIAGNOSIS — H53461 Homonymous bilateral field defects, right side: Secondary | ICD-10-CM | POA: Diagnosis present

## 2019-02-05 DIAGNOSIS — G40901 Epilepsy, unspecified, not intractable, with status epilepticus: Principal | ICD-10-CM

## 2019-02-05 DIAGNOSIS — G8191 Hemiplegia, unspecified affecting right dominant side: Secondary | ICD-10-CM | POA: Diagnosis present

## 2019-02-05 DIAGNOSIS — E039 Hypothyroidism, unspecified: Secondary | ICD-10-CM | POA: Diagnosis present

## 2019-02-05 DIAGNOSIS — E871 Hypo-osmolality and hyponatremia: Secondary | ICD-10-CM | POA: Diagnosis present

## 2019-02-05 DIAGNOSIS — E1110 Type 2 diabetes mellitus with ketoacidosis without coma: Secondary | ICD-10-CM

## 2019-02-05 DIAGNOSIS — K7460 Unspecified cirrhosis of liver: Secondary | ICD-10-CM | POA: Diagnosis present

## 2019-02-05 DIAGNOSIS — F10239 Alcohol dependence with withdrawal, unspecified: Secondary | ICD-10-CM | POA: Diagnosis present

## 2019-02-05 DIAGNOSIS — D6959 Other secondary thrombocytopenia: Secondary | ICD-10-CM | POA: Diagnosis present

## 2019-02-05 DIAGNOSIS — R296 Repeated falls: Secondary | ICD-10-CM | POA: Diagnosis present

## 2019-02-05 DIAGNOSIS — I160 Hypertensive urgency: Secondary | ICD-10-CM | POA: Diagnosis present

## 2019-02-05 DIAGNOSIS — Z0189 Encounter for other specified special examinations: Secondary | ICD-10-CM

## 2019-02-05 DIAGNOSIS — R4701 Aphasia: Secondary | ICD-10-CM | POA: Diagnosis present

## 2019-02-05 DIAGNOSIS — Z79899 Other long term (current) drug therapy: Secondary | ICD-10-CM

## 2019-02-05 DIAGNOSIS — I10 Essential (primary) hypertension: Secondary | ICD-10-CM | POA: Diagnosis present

## 2019-02-05 DIAGNOSIS — Z79890 Hormone replacement therapy (postmenopausal): Secondary | ICD-10-CM

## 2019-02-05 DIAGNOSIS — F1010 Alcohol abuse, uncomplicated: Secondary | ICD-10-CM

## 2019-02-05 DIAGNOSIS — Z7984 Long term (current) use of oral hypoglycemic drugs: Secondary | ICD-10-CM

## 2019-02-05 DIAGNOSIS — G92 Toxic encephalopathy: Secondary | ICD-10-CM

## 2019-02-05 DIAGNOSIS — Z8249 Family history of ischemic heart disease and other diseases of the circulatory system: Secondary | ICD-10-CM

## 2019-02-05 DIAGNOSIS — E876 Hypokalemia: Secondary | ICD-10-CM

## 2019-02-05 DIAGNOSIS — W19XXXA Unspecified fall, initial encounter: Secondary | ICD-10-CM | POA: Diagnosis present

## 2019-02-05 DIAGNOSIS — Z4659 Encounter for fitting and adjustment of other gastrointestinal appliance and device: Secondary | ICD-10-CM

## 2019-02-05 DIAGNOSIS — F1011 Alcohol abuse, in remission: Secondary | ICD-10-CM | POA: Insufficient documentation

## 2019-02-05 DIAGNOSIS — G9341 Metabolic encephalopathy: Secondary | ICD-10-CM | POA: Diagnosis present

## 2019-02-05 DIAGNOSIS — R569 Unspecified convulsions: Secondary | ICD-10-CM

## 2019-02-05 DIAGNOSIS — E0910 Drug or chemical induced diabetes mellitus with ketoacidosis without coma: Secondary | ICD-10-CM

## 2019-02-05 DIAGNOSIS — R29810 Facial weakness: Secondary | ICD-10-CM | POA: Diagnosis present

## 2019-02-05 LAB — COMPREHENSIVE METABOLIC PANEL
ALT: 28 U/L (ref 0–44)
AST: 33 U/L (ref 15–41)
Albumin: 4.3 g/dL (ref 3.5–5.0)
Alkaline Phosphatase: 67 U/L (ref 38–126)
Anion gap: 18 — ABNORMAL HIGH (ref 5–15)
BUN: 15 mg/dL (ref 8–23)
CO2: 19 mmol/L — ABNORMAL LOW (ref 22–32)
Calcium: 10.1 mg/dL (ref 8.9–10.3)
Chloride: 97 mmol/L — ABNORMAL LOW (ref 98–111)
Creatinine, Ser: 1.16 mg/dL — ABNORMAL HIGH (ref 0.44–1.00)
GFR calc Af Amer: 58 mL/min — ABNORMAL LOW (ref >60)
GFR calc non Af Amer: 50 mL/min — ABNORMAL LOW (ref >60)
Glucose, Bld: 511 mg/dL (ref 70–99)
Potassium: 2.8 mmol/L — ABNORMAL LOW (ref 3.5–5.1)
Sodium: 134 mmol/L — ABNORMAL LOW (ref 135–145)
Total Bilirubin: 0.8 mg/dL (ref 0.3–1.2)
Total Protein: 7.2 g/dL (ref 6.5–8.1)

## 2019-02-05 LAB — CK: Total CK: 63 U/L (ref 38–234)

## 2019-02-05 LAB — CBG MONITORING, ED
Glucose-Capillary: 470 mg/dL — ABNORMAL HIGH (ref 70–99)
Glucose-Capillary: 397 mg/dL — ABNORMAL HIGH (ref 70–99)

## 2019-02-05 LAB — RAPID URINE DRUG SCREEN, HOSP PERFORMED
Amphetamines: NOT DETECTED
Barbiturates: NOT DETECTED
Benzodiazepines: NOT DETECTED
Cocaine: NOT DETECTED
Opiates: NOT DETECTED
Tetrahydrocannabinol: NOT DETECTED

## 2019-02-05 LAB — POCT I-STAT EG7
Acid-base deficit: 1 mmol/L (ref 0.0–2.0)
Bicarbonate: 23.6 mmol/L (ref 20.0–28.0)
Calcium, Ion: 1.16 mmol/L (ref 1.15–1.40)
HCT: 35 % — ABNORMAL LOW (ref 36.0–46.0)
Hemoglobin: 11.9 g/dL — ABNORMAL LOW (ref 12.0–15.0)
O2 Saturation: 46 %
Potassium: 2.8 mmol/L — ABNORMAL LOW (ref 3.5–5.1)
Sodium: 135 mmol/L (ref 135–145)
TCO2: 25 mmol/L (ref 22–32)
pCO2, Ven: 37 mmHg — ABNORMAL LOW (ref 44.0–60.0)
pH, Ven: 7.412 (ref 7.250–7.430)
pO2, Ven: 25 mmHg — CL (ref 32.0–45.0)

## 2019-02-05 LAB — CBC
HCT: 32.9 % — ABNORMAL LOW (ref 36.0–46.0)
Hemoglobin: 11.1 g/dL — ABNORMAL LOW (ref 12.0–15.0)
MCH: 31.2 pg (ref 26.0–34.0)
MCHC: 33.7 g/dL (ref 30.0–36.0)
MCV: 92.4 fL (ref 80.0–100.0)
Platelets: 102 10*3/uL — ABNORMAL LOW (ref 150–400)
RBC: 3.56 MIL/uL — ABNORMAL LOW (ref 3.87–5.11)
RDW: 13 % (ref 11.5–15.5)
WBC: 4.3 10*3/uL (ref 4.0–10.5)
nRBC: 0 % (ref 0.0–0.2)

## 2019-02-05 LAB — URINALYSIS, ROUTINE W REFLEX MICROSCOPIC
Bacteria, UA: NONE SEEN
Bilirubin Urine: NEGATIVE
Glucose, UA: >=500 mg/dL — AB
Hgb urine dipstick: NEGATIVE
Ketones, ur: 5 mg/dL — AB
Leukocytes,Ua: NEGATIVE
Nitrite: NEGATIVE
Protein, ur: NEGATIVE mg/dL
Specific Gravity, Urine: 1.011 (ref 1.005–1.030)
pH: 7 (ref 5.0–8.0)

## 2019-02-05 LAB — HEMOGLOBIN A1C
Hgb A1c MFr Bld: 11.1 % — ABNORMAL HIGH (ref 4.8–5.6)
Mean Plasma Glucose: 271.87 mg/dL

## 2019-02-05 LAB — TSH: TSH: 3.471 u[IU]/mL (ref 0.350–4.500)

## 2019-02-05 LAB — DIFFERENTIAL
Abs Immature Granulocytes: 0 10*3/uL (ref 0.00–0.07)
Basophils Absolute: 0 10*3/uL (ref 0.0–0.1)
Basophils Relative: 0 %
Eosinophils Absolute: 0 10*3/uL (ref 0.0–0.5)
Eosinophils Relative: 1 %
Immature Granulocytes: 0 %
Lymphocytes Relative: 27 %
Lymphs Abs: 1.2 10*3/uL (ref 0.7–4.0)
Monocytes Absolute: 0.2 10*3/uL (ref 0.1–1.0)
Monocytes Relative: 6 %
Neutro Abs: 2.8 10*3/uL (ref 1.7–7.7)
Neutrophils Relative %: 66 %

## 2019-02-05 LAB — I-STAT CREATININE, ED: Creatinine, Ser: 0.8 mg/dL (ref 0.44–1.00)

## 2019-02-05 LAB — GLUCOSE, CAPILLARY: Glucose-Capillary: 310 mg/dL — ABNORMAL HIGH (ref 70–99)

## 2019-02-05 LAB — VITAMIN B12: Vitamin B-12: 390 pg/mL (ref 180–914)

## 2019-02-05 LAB — ETHANOL: Alcohol, Ethyl (B): <10 mg/dL (ref <10)

## 2019-02-05 LAB — PROTIME-INR
INR: 1 (ref 0.8–1.2)
Prothrombin Time: 13.4 seconds (ref 11.4–15.2)

## 2019-02-05 LAB — AMMONIA: Ammonia: 39 umol/L — ABNORMAL HIGH (ref 9–35)

## 2019-02-05 LAB — MAGNESIUM: Magnesium: 1.4 mg/dL — ABNORMAL LOW (ref 1.7–2.4)

## 2019-02-05 LAB — TROPONIN I: Troponin I: <0.03 ng/mL (ref <0.03)

## 2019-02-05 LAB — APTT: aPTT: 20 seconds — ABNORMAL LOW (ref 24–36)

## 2019-02-05 MED ORDER — POTASSIUM CHLORIDE 10 MEQ/100ML IV SOLN: 10.0000 meq | INTRAVENOUS | Status: DC

## 2019-02-05 MED ORDER — THIAMINE HCL 100 MG/ML IJ SOLN
Freq: Once | INTRAVENOUS | Status: DC
  Filled 2019-02-05: qty 1000

## 2019-02-05 MED ORDER — POTASSIUM CHLORIDE 10 MEQ/50ML IV SOLN
10.0000 meq | INTRAVENOUS | Status: AC
  Administered 2019-02-06 (×6): 10 meq via INTRAVENOUS
  Filled 2019-02-05: qty 50

## 2019-02-05 MED ORDER — POTASSIUM CHLORIDE 10 MEQ/100ML IV SOLN
10.0000 meq | INTRAVENOUS | Status: DC
  Administered 2019-02-05: 10 meq via INTRAVENOUS
  Filled 2019-02-05: qty 100

## 2019-02-05 MED ORDER — LORAZEPAM 2 MG/ML IJ SOLN
INTRAMUSCULAR | Status: AC
  Filled 2019-02-05: qty 1

## 2019-02-05 MED ORDER — SODIUM CHLORIDE 0.9 % IV SOLN
INTRAVENOUS | Status: DC | PRN
  Administered 2019-02-06 – 2019-02-07 (×2): 500 mL via INTRAVENOUS

## 2019-02-05 MED ORDER — LACTATED RINGERS IV SOLN
INTRAVENOUS | Status: DC
  Administered 2019-02-05 – 2019-02-06 (×2): via INTRAVENOUS

## 2019-02-05 MED ORDER — MAGNESIUM SULFATE 2 GM/50ML IV SOLN
2.0000 g | Freq: Once | INTRAVENOUS | Status: DC
  Administered 2019-02-05: 2 g via INTRAVENOUS
  Filled 2019-02-05: qty 50

## 2019-02-05 MED ORDER — FOLIC ACID 5 MG/ML IJ SOLN
1.0000 mg | Freq: Every day | INTRAMUSCULAR | Status: DC
  Administered 2019-02-06 – 2019-02-08 (×3): 1 mg via INTRAVENOUS
  Filled 2019-02-05 (×4): qty 0.2

## 2019-02-05 MED ORDER — LORAZEPAM 2 MG/ML IJ SOLN
2.0000 mg | Freq: Once | INTRAMUSCULAR | Status: AC
  Administered 2019-02-05: 2 mg via INTRAVENOUS

## 2019-02-05 MED ORDER — LORAZEPAM 2 MG/ML IJ SOLN
2.0000 mg | Freq: Once | INTRAMUSCULAR | Status: AC
  Administered 2019-02-05: 2 mg via INTRAVENOUS
  Filled 2019-02-05: qty 1

## 2019-02-05 MED ORDER — SODIUM CHLORIDE 0.9% FLUSH: 3.0000 mL | Freq: Once | INTRAVENOUS | Status: DC

## 2019-02-05 MED ORDER — IOHEXOL 350 MG/ML SOLN
100.0000 mL | Freq: Once | INTRAVENOUS | Status: AC | PRN
  Administered 2019-02-05: 100 mL via INTRAVENOUS

## 2019-02-05 MED ORDER — ENOXAPARIN SODIUM 40 MG/0.4ML ~~LOC~~ SOLN
40.0000 mg | Freq: Every day | SUBCUTANEOUS | Status: DC
  Administered 2019-02-06 – 2019-02-08 (×3): 40 mg via SUBCUTANEOUS
  Filled 2019-02-05 (×3): qty 0.4

## 2019-02-05 MED ORDER — SODIUM CHLORIDE 0.9 % IV SOLN
INTRAVENOUS | Status: DC
  Administered 2019-02-05: 22:00:00 via INTRAVENOUS

## 2019-02-05 MED ORDER — LORAZEPAM 2 MG/ML IJ SOLN
INTRAMUSCULAR | Status: AC
  Administered 2019-02-05: 19:00:00 2 mg via INTRAVENOUS
  Filled 2019-02-05: qty 1

## 2019-02-05 MED ORDER — LEVETIRACETAM IN NACL 1000 MG/100ML IV SOLN
1000.0000 mg | INTRAVENOUS | Status: AC
  Administered 2019-02-05: 1000 mg via INTRAVENOUS
  Filled 2019-02-05: qty 100

## 2019-02-05 MED ORDER — POTASSIUM CHLORIDE 10 MEQ/50ML IV SOLN
10.0000 meq | INTRAVENOUS | Status: DC
  Administered 2019-02-05: 10 meq via INTRAVENOUS
  Filled 2019-02-05 (×6): qty 50

## 2019-02-05 MED ORDER — LACTATED RINGERS IV BOLUS
1000.0000 mL | Freq: Once | INTRAVENOUS | Status: AC
  Administered 2019-02-05: 1000 mL via INTRAVENOUS

## 2019-02-05 MED ORDER — INSULIN REGULAR(HUMAN) IN NACL 100-0.9 UT/100ML-% IV SOLN
INTRAVENOUS | Status: DC
  Administered 2019-02-05: 3.4 [IU]/h via INTRAVENOUS
  Filled 2019-02-05: qty 100

## 2019-02-05 MED ORDER — SODIUM PHOSPHATES 45 MMOLE/15ML IV SOLN
20.0000 mmol | Freq: Once | INTRAVENOUS | Status: AC
  Administered 2019-02-06: 20 mmol via INTRAVENOUS
  Filled 2019-02-05: qty 6.67

## 2019-02-05 MED ORDER — DEXTROSE 50 % IV SOLN: 25.0000 mL | INTRAVENOUS | Status: DC | PRN

## 2019-02-05 MED ORDER — DEXTROSE-NACL 5-0.45 % IV SOLN
INTRAVENOUS | Status: DC
  Administered 2019-02-06: 03:00:00 via INTRAVENOUS

## 2019-02-05 MED ORDER — CLONIDINE HCL 0.1 MG PO TABS
0.1000 mg | ORAL_TABLET | Freq: Every day | ORAL | Status: DC
  Administered 2019-02-06 – 2019-02-07 (×3): 0.1 mg via ORAL
  Filled 2019-02-05 (×4): qty 1

## 2019-02-05 MED ORDER — THIAMINE HCL 100 MG/ML IJ SOLN
100.0000 mg | Freq: Every day | INTRAMUSCULAR | Status: DC
  Filled 2019-02-05: qty 2

## 2019-02-05 MED ORDER — LORAZEPAM 2 MG/ML IJ SOLN
1.0000 mg | INTRAMUSCULAR | Status: DC | PRN
  Administered 2019-02-05: 2 mg via INTRAVENOUS
  Filled 2019-02-05: qty 1

## 2019-02-05 MED ORDER — INSULIN REGULAR BOLUS VIA INFUSION
0.0000 [IU] | Freq: Three times a day (TID) | INTRAVENOUS | Status: DC
  Filled 2019-02-05: qty 10

## 2019-02-05 MED ORDER — MAGNESIUM SULFATE 2 GM/50ML IV SOLN
2.0000 g | Freq: Once | INTRAVENOUS | Status: AC
  Filled 2019-02-05: qty 50

## 2019-02-05 MED ORDER — SODIUM CHLORIDE 0.9 % IV SOLN
1000.0000 mg | Freq: Once | INTRAVENOUS | Status: AC
  Administered 2019-02-05: 1000 mg via INTRAVENOUS
  Filled 2019-02-05: qty 20

## 2019-02-05 NOTE — ED Notes (Signed)
Pt has episodes of stillness with nystagmus, center to Left then becomes very resistive to all care measures.  Pt does not follow commands at this time.

## 2019-02-05 NOTE — ED Notes (Signed)
All three ports have good blood return, flush easily. Confirmed by Dr. Carlota Raspberry, ok to use.

## 2019-02-05 NOTE — Progress Notes (Signed)
MRI preliminary read by Radiology: No acute infarction.   Electronically signed: Dr. Caryl Pina

## 2019-02-05 NOTE — ED Notes (Signed)
Shannon Herrera contact: [phone]- neurologist spoke with him before CT.

## 2019-02-05 NOTE — ED Notes (Signed)
Warm blanket applied, pt appears much calmer at this time.

## 2019-02-05 NOTE — ED Provider Notes (Signed)
[facility] EMERGENCY DEPARTMENT Provider Note   CSN: [id number] Arrival date & time: 02/05/19  1840  LEVEL 5 CAVEAT - ALTERED MENTAL STATUS   History   Chief Complaint No chief complaint on file.   HPI Shannon Herrera is a 63 y.o. female.     HPI  63 year old female presents with acute altered mental state/nonverbal, right-sided weakness and left-sided gaze.  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  The patient apparently came back from a trip at 3:30 PM and was normal.  Shortly thereafter fell and struck her head.  EMS has noticed her to have right-sided flaccid extremities and left-sided gaze.  Is not speaking.  Glucose over 500.  Blood pressure has been very high as well with most recent systolic blood pressure of 220.  No vomiting.`  Past Medical History:  Diagnosis Date   Diabetes mellitus without complication (HCC)    Hypertension    Hypothyroid     Patient Active Problem List   Diagnosis Date Noted   Acute metabolic encephalopathy 02/05/2019   Diabetic acidosis without coma (HCC)    H/O ETOH abuse    C. difficile colitis 07/05/2018   Syncope and collapse 07/03/2018   Hyperglycemia 01/22/2018   Essential hypertension 01/22/2018   Alcohol withdrawal syndrome with complication, with unspecified complication (HCC) 01/22/2018   Elevated LFTs 01/22/2018   Lactic acidosis 01/22/2018   Hypothyroidism 01/22/2018   Thrombocytopenia (HCC) 01/22/2018   Hypomagnesemia 01/22/2018   Acute renal injury (HCC) 01/22/2018    Past Surgical History:  Procedure Laterality Date   denies       OB History   No obstetric history on file.      Home Medications    Prior to Admission medications   Medication Sig Start Date End Date Taking? Authorizing Provider  levothyroxine (SYNTHROID, LEVOTHROID) 100 MCG tablet Take 1 tablet (100 mcg  total) by mouth daily before breakfast. 07/06/18 11/03/18  Enid Baas, MD  lovastatin (MEVACOR) 20 MG tablet Take 40 mg by mouth at bedtime. 06/21/18   [provider]  metFORMIN (GLUCOPHAGE) 1000 MG tablet Take 1 tablet (1,000 mg total) by mouth 2 (two) times daily with a meal. 05/01/18   Derwood Kaplan, MD  metoprolol tartrate (LOPRESSOR) 50 MG tablet Take 1 tablet (50 mg total) by mouth 2 (two) times daily. 07/06/18   Enid Baas, MD  ondansetron (ZOFRAN ODT) 4 MG disintegrating tablet Take 1 tablet (4 mg total) by mouth every 8 (eight) hours as needed for nausea or vomiting. 05/01/18   Derwood Kaplan, MD  potassium chloride SA (K-DUR,KLOR-CON) 20 MEQ tablet Take 1 tablet (20 mEq total) by mouth daily. 07/06/18   Enid Baas, MD  sertraline (ZOLOFT) 25 MG tablet Take 1 tablet (25 mg total) by mouth daily. 05/01/18 05/01/19  Derwood Kaplan, MD    Family History Family History  Problem Relation Age of Onset   Hypertension Mother     Social History Social History   Tobacco Use   Smoking status: Never Smoker   Smokeless tobacco: Never Used  Substance Use Topics   Alcohol use: Yes    Comment: intoxicated at this time   Drug use: Never     Allergies   Patient has no known allergies.   Review of Systems Review of Systems  Unable to perform ROS: Mental status change     Physical  Exam Updated Vital Signs BP (!) 159/102    Pulse (!) 123    Temp 98.1 F (36.7 C)    Resp 16    Wt 57.1 kg    SpO2 100%    BMI 23.02 kg/m   Physical Exam Vitals signs and nursing note reviewed.  Constitutional:      Appearance: She is well-developed.     Interventions: Cervical collar in place.  HENT:     Head: Normocephalic and atraumatic.     Right Ear: External ear normal.     Left Ear: External ear normal.     Nose: Nose normal.  Eyes:     General:        Right eye: No discharge.        Left eye: No discharge.  Cardiovascular:     Rate and Rhythm: Normal  rate and regular rhythm.     Heart sounds: Normal heart sounds.  Pulmonary:     Effort: Pulmonary effort is normal.  Abdominal:     General: There is no distension.  Skin:    General: Skin is warm and dry.  Neurological:     Mental Status: She is alert.     Comments: Alert, seems to respond to voice but does not follow commands. When left arm/leg are raised up by staff, she holds them up. Right side is flaccid. Right sided neglect.  Psychiatric:        Mood and Affect: Mood is not anxious.      ED Treatments / Results  Labs (all labs ordered are listed, but only abnormal results are displayed) Labs Reviewed  APTT - Abnormal; Notable for the following components:      Result Value   aPTT 20 (*)    All other components within normal limits  CBC - Abnormal; Notable for the following components:   RBC 3.56 (*)    Hemoglobin 11.1 (*)    HCT 32.9 (*)    Platelets 102 (*)    All other components within normal limits  COMPREHENSIVE METABOLIC PANEL - Abnormal; Notable for the following components:   Sodium 134 (*)    Potassium 2.8 (*)    Chloride 97 (*)    CO2 19 (*)    Glucose, Bld 511 (*)    Creatinine, Ser 1.16 (*)    GFR calc non Af Amer 50 (*)    GFR calc Af Amer 58 (*)    Anion gap 18 (*)    All other components within normal limits  URINALYSIS, ROUTINE W REFLEX MICROSCOPIC - Abnormal; Notable for the following components:   Color, Urine COLORLESS (*)    Glucose, UA >=500 (*)    Ketones, ur 5 (*)    All other components within normal limits  MAGNESIUM - Abnormal; Notable for the following components:   Magnesium 1.4 (*)    All other components within normal limits  CBG MONITORING, ED - Abnormal; Notable for the following components:   Glucose-Capillary 470 (*)    All other components within normal limits  POCT I-STAT EG7 - Abnormal; Notable for the following components:   pCO2, Ven 37.0 (*)    pO2, Ven 25.0 (*)    Potassium 2.8 (*)    HCT 35.0 (*)    Hemoglobin  11.9 (*)    All other components within normal limits  CBG MONITORING, ED - Abnormal; Notable for the following components:   Glucose-Capillary 397 (*)    All other components within normal  limits  PROTIME-INR  DIFFERENTIAL  RAPID URINE DRUG SCREEN, HOSP PERFORMED  ETHANOL  TROPONIN I  CK  TSH  CBC  CREATININE, SERUM  AMMONIA  BASIC METABOLIC PANEL  BASIC METABOLIC PANEL  BASIC METABOLIC PANEL  HEMOGLOBIN A1C  VITAMIN B12  I-STAT CREATININE, ED  I-STAT VENOUS BLOOD GAS, ED    EKG EKG Interpretation  Date/Time:  Sunday February 05 2019 20:00:02 EDT Ventricular Rate:  156 PR Interval:    QRS Duration: 82 QT Interval:  281 QTC Calculation: 453 R Axis:   38 Text Interpretation:  Sinus tachycardia Probable left atrial enlargement Repolarization abnormality, prob rate related similar to Sept 2019 Confirmed by Pricilla Loveless [phone]) on 02/05/2019 8:58:30 PM   Radiology Ct Angio Head W Or Wo Contrast  Result Date: 02/05/2019 CLINICAL DATA:  Initial evaluation for acute right-sided weakness, nonverbal. EXAM: CT ANGIOGRAPHY HEAD AND NECK CT PERFUSION BRAIN TECHNIQUE: Multidetector CT imaging of the head and neck was performed using the standard protocol during bolus administration of intravenous contrast. Multiplanar CT image reconstructions and MIPs were obtained to evaluate the vascular anatomy. Carotid stenosis measurements (when applicable) are obtained utilizing NASCET criteria, using the distal internal carotid diameter as the denominator. Multiphase CT imaging of the brain was performed following IV bolus contrast injection. Subsequent parametric perfusion maps were calculated using RAPID software. CONTRAST:  OMNIPAQUE IOHEXOL 350 MG/ML SOLN COMPARISON:  Prior CT from earlier the same day. FINDINGS: CTA NECK FINDINGS Aortic arch: Visualized aortic arch of normal caliber with normal 3 vessel morphology. No flow-limiting stenosis about the origin of the great vessels. Visualized  subclavian arteries widely patent. Right carotid system: Right common carotid artery patent from its origin to the bifurcation without stenosis. No significant atheromatous narrowing about the right bifurcation. Right ICA mildly tortuous but widely patent to the skull base without stenosis, dissection, or occlusion. Left carotid system: Left common carotid artery patent from its origin to the bifurcation without stenosis. Mild a centric calcified plaque about the left bifurcation without hemodynamically significant stenosis. Left ICA mildly tortuous but widely patent distally to the skull base without stenosis, dissection or occlusion. Vertebral arteries: Both of the vertebral arteries arise from the subclavian arteries. Vertebral arteries widely patent within the neck without stenosis, dissection, or occlusion. Skeleton: No acute osseous finding. No discrete lytic or blastic osseous lesions. Mild to moderate cervical spondylolysis at C5-6 and C6-7. Other neck: No other acute soft tissue abnormality within the neck. Salivary glands within normal limits. Thyroid normal. No adenopathy. Upper chest: Visualized upper chest within normal limits. Visualized lungs are clear. Review of the MIP images confirms the above findings CTA HEAD FINDINGS Anterior circulation: Petrous segments widely patent bilaterally. Mild scattered atheromatous irregularity within the cavernous/supraclinoid ICAs without flow-limiting stenosis. ICA termini well perfused. A1 segments patent bilaterally. Normal anterior communicating artery. Anterior cerebral arteries patent to their distal aspects without hemodynamically significant stenosis. M1 segments patent bilaterally. Normal MCA bifurcations. Distal MCA branches well perfused and symmetric. Posterior circulation: Vertebral arteries patent to the vertebrobasilar junction without flow-limiting stenosis. Posterior inferior cerebral arteries patent bilaterally. Basilar artery diminutive but patent  to its distal aspect without stenosis. Superior cerebral arteries patent bilaterally. Left PCA supplied via the basilar. Predominant fetal type origin of the right PCA PCAs patent to their distal aspects without flow-limiting stenosis or occlusion. Venous sinuses: Grossly patent allowing for timing of the contrast bolus. Anatomic variants: Fetal type origin of the right PCA. Overall diminutive vertebrobasilar system. Delayed phase: Not performed. Review  of the MIP images confirms the above findings CT Brain Perfusion Findings: ASPECTS: 10 CBF (<30%) Volume: 0mL Perfusion (Tmax>6.0s) volume: 0mL Mismatch Volume: 0mL Infarction Location:Negative CT perfusion for acute ischemic infarct. IMPRESSION: 1. Negative CTA for large vessel occlusion. 2. Negative CT perfusion. No evidence for core infarct or perfusion abnormality. 3. Mild atherosclerotic change about the left carotid bifurcation and carotid siphons for patient age. No hemodynamically significant or correctable stenosis. Critical Value/emergent results were called by telephone at the time of interpretation on 02/05/2019 at 7:15 pm to Dr. Milon Dikes , who verbally acknowledged these results. Electronically Signed   By: Rise Mu M.D.   On: 02/05/2019 19:56   Ct Angio Neck W Or Wo Contrast  Result Date: 02/05/2019 CLINICAL DATA:  Initial evaluation for acute right-sided weakness, nonverbal. EXAM: CT ANGIOGRAPHY HEAD AND NECK CT PERFUSION BRAIN TECHNIQUE: Multidetector CT imaging of the head and neck was performed using the standard protocol during bolus administration of intravenous contrast. Multiplanar CT image reconstructions and MIPs were obtained to evaluate the vascular anatomy. Carotid stenosis measurements (when applicable) are obtained utilizing NASCET criteria, using the distal internal carotid diameter as the denominator. Multiphase CT imaging of the brain was performed following IV bolus contrast injection. Subsequent parametric  perfusion maps were calculated using RAPID software. CONTRAST:  OMNIPAQUE IOHEXOL 350 MG/ML SOLN COMPARISON:  Prior CT from earlier the same day. FINDINGS: CTA NECK FINDINGS Aortic arch: Visualized aortic arch of normal caliber with normal 3 vessel morphology. No flow-limiting stenosis about the origin of the great vessels. Visualized subclavian arteries widely patent. Right carotid system: Right common carotid artery patent from its origin to the bifurcation without stenosis. No significant atheromatous narrowing about the right bifurcation. Right ICA mildly tortuous but widely patent to the skull base without stenosis, dissection, or occlusion. Left carotid system: Left common carotid artery patent from its origin to the bifurcation without stenosis. Mild a centric calcified plaque about the left bifurcation without hemodynamically significant stenosis. Left ICA mildly tortuous but widely patent distally to the skull base without stenosis, dissection or occlusion. Vertebral arteries: Both of the vertebral arteries arise from the subclavian arteries. Vertebral arteries widely patent within the neck without stenosis, dissection, or occlusion. Skeleton: No acute osseous finding. No discrete lytic or blastic osseous lesions. Mild to moderate cervical spondylolysis at C5-6 and C6-7. Other neck: No other acute soft tissue abnormality within the neck. Salivary glands within normal limits. Thyroid normal. No adenopathy. Upper chest: Visualized upper chest within normal limits. Visualized lungs are clear. Review of the MIP images confirms the above findings CTA HEAD FINDINGS Anterior circulation: Petrous segments widely patent bilaterally. Mild scattered atheromatous irregularity within the cavernous/supraclinoid ICAs without flow-limiting stenosis. ICA termini well perfused. A1 segments patent bilaterally. Normal anterior communicating artery. Anterior cerebral arteries patent to their distal aspects without  hemodynamically significant stenosis. M1 segments patent bilaterally. Normal MCA bifurcations. Distal MCA branches well perfused and symmetric. Posterior circulation: Vertebral arteries patent to the vertebrobasilar junction without flow-limiting stenosis. Posterior inferior cerebral arteries patent bilaterally. Basilar artery diminutive but patent to its distal aspect without stenosis. Superior cerebral arteries patent bilaterally. Left PCA supplied via the basilar. Predominant fetal type origin of the right PCA PCAs patent to their distal aspects without flow-limiting stenosis or occlusion. Venous sinuses: Grossly patent allowing for timing of the contrast bolus. Anatomic variants: Fetal type origin of the right PCA. Overall diminutive vertebrobasilar system. Delayed phase: Not performed. Review of the MIP images confirms the above findings  CT Brain Perfusion Findings: ASPECTS: 10 CBF (<30%) Volume: 36mL Perfusion (Tmax>6.0s) volume: 39mL Mismatch Volume: 24mL Infarction Location:Negative CT perfusion for acute ischemic infarct. IMPRESSION: 1. Negative CTA for large vessel occlusion. 2. Negative CT perfusion. No evidence for core infarct or perfusion abnormality. 3. Mild atherosclerotic change about the left carotid bifurcation and carotid siphons for patient age. No hemodynamically significant or correctable stenosis. Critical Value/emergent results were called by telephone at the time of interpretation on 02/05/2019 at 7:15 pm to Dr. Milon Dikes , who verbally acknowledged these results. Electronically Signed   By: Rise Mu M.D.   On: 02/05/2019 19:56   Mr Brain Wo Contrast  Result Date: 02/05/2019 CLINICAL DATA:  63 year old female code stroke with right side weakness. Agitated. EXAM: MRI HEAD WITHOUT CONTRAST TECHNIQUE: Multiplanar, multiecho pulse sequences of the brain and surrounding structures were obtained without intravenous contrast. COMPARISON:  CTA head and neck, CT perfusion today.  FINDINGS: The examination had to be discontinued prior to completion due to patient agitation. Axial and coronal diffusion weighted imaging and motion degraded axial SWI only is obtained. No convincing No restricted diffusion or evidence of acute infarction. No midline shift, ventriculomegaly, or intracranial mass effect. SWI imaging is motion degraded, no obvious cerebral blood products. Abnormal diffusion in the right scalp soft tissues compatible with scalp hematoma along the right anterior frontal convexity (series 5, image 93). IMPRESSION: Limited noncontrast brain MRI appears negative for acute infarct. This was communicated to Dr. Otelia Limes at 7:57 pm on 02/05/2019 by text page via the [facility] messaging system. Electronically Signed   By: Odessa Fleming M.D.   On: 02/05/2019 19:57   Ct Cerebral Perfusion W Contrast  Result Date: 02/05/2019 CLINICAL DATA:  Initial evaluation for acute right-sided weakness, nonverbal. EXAM: CT ANGIOGRAPHY HEAD AND NECK CT PERFUSION BRAIN TECHNIQUE: Multidetector CT imaging of the head and neck was performed using the standard protocol during bolus administration of intravenous contrast. Multiplanar CT image reconstructions and MIPs were obtained to evaluate the vascular anatomy. Carotid stenosis measurements (when applicable) are obtained utilizing NASCET criteria, using the distal internal carotid diameter as the denominator. Multiphase CT imaging of the brain was performed following IV bolus contrast injection. Subsequent parametric perfusion maps were calculated using RAPID software. CONTRAST:  OMNIPAQUE IOHEXOL 350 MG/ML SOLN COMPARISON:  Prior CT from earlier the same day. FINDINGS: CTA NECK FINDINGS Aortic arch: Visualized aortic arch of normal caliber with normal 3 vessel morphology. No flow-limiting stenosis about the origin of the great vessels. Visualized subclavian arteries widely patent. Right carotid system: Right common carotid artery patent from its origin to the  bifurcation without stenosis. No significant atheromatous narrowing about the right bifurcation. Right ICA mildly tortuous but widely patent to the skull base without stenosis, dissection, or occlusion. Left carotid system: Left common carotid artery patent from its origin to the bifurcation without stenosis. Mild a centric calcified plaque about the left bifurcation without hemodynamically significant stenosis. Left ICA mildly tortuous but widely patent distally to the skull base without stenosis, dissection or occlusion. Vertebral arteries: Both of the vertebral arteries arise from the subclavian arteries. Vertebral arteries widely patent within the neck without stenosis, dissection, or occlusion. Skeleton: No acute osseous finding. No discrete lytic or blastic osseous lesions. Mild to moderate cervical spondylolysis at C5-6 and C6-7. Other neck: No other acute soft tissue abnormality within the neck. Salivary glands within normal limits. Thyroid normal. No adenopathy. Upper chest: Visualized upper chest within normal limits. Visualized lungs are clear. Review  of the MIP images confirms the above findings CTA HEAD FINDINGS Anterior circulation: Petrous segments widely patent bilaterally. Mild scattered atheromatous irregularity within the cavernous/supraclinoid ICAs without flow-limiting stenosis. ICA termini well perfused. A1 segments patent bilaterally. Normal anterior communicating artery. Anterior cerebral arteries patent to their distal aspects without hemodynamically significant stenosis. M1 segments patent bilaterally. Normal MCA bifurcations. Distal MCA branches well perfused and symmetric. Posterior circulation: Vertebral arteries patent to the vertebrobasilar junction without flow-limiting stenosis. Posterior inferior cerebral arteries patent bilaterally. Basilar artery diminutive but patent to its distal aspect without stenosis. Superior cerebral arteries patent bilaterally. Left PCA supplied via the  basilar. Predominant fetal type origin of the right PCA PCAs patent to their distal aspects without flow-limiting stenosis or occlusion. Venous sinuses: Grossly patent allowing for timing of the contrast bolus. Anatomic variants: Fetal type origin of the right PCA. Overall diminutive vertebrobasilar system. Delayed phase: Not performed. Review of the MIP images confirms the above findings CT Brain Perfusion Findings: ASPECTS: 10 CBF (<30%) Volume: 0mL Perfusion (Tmax>6.0s) volume: 0mL Mismatch Volume: 0mL Infarction Location:Negative CT perfusion for acute ischemic infarct. IMPRESSION: 1. Negative CTA for large vessel occlusion. 2. Negative CT perfusion. No evidence for core infarct or perfusion abnormality. 3. Mild atherosclerotic change about the left carotid bifurcation and carotid siphons for patient age. No hemodynamically significant or correctable stenosis. Critical Value/emergent results were called by telephone at the time of interpretation on 02/05/2019 at 7:15 pm to Dr. Milon DikesASHISH ARORA , who verbally acknowledged these results. Electronically Signed   By: Rise MuBenjamin  McClintock M.D.   On: 02/05/2019 19:56   Dg Chest Portable 1 View  Result Date: 02/05/2019 CLINICAL DATA:  Central line placement EXAM: PORTABLE CHEST 1 VIEW COMPARISON:  07/03/2018 FINDINGS: Right IJ central venous catheter tip over the cavoatrial region. No pneumothorax. No focal consolidation or effusion. Normal heart size. No pneumothorax. IMPRESSION: Right IJ central venous catheter tip over the cavoatrial region. Negative for a pneumothorax Electronically Signed   By: Jasmine PangKim  Fujinaga M.D.   On: 02/05/2019 21:55   Ct Head Code Stroke Wo Contrast  Result Date: 02/05/2019 CLINICAL DATA:  Code stroke.  Possible stroke.  Right weakness. EXAM: CT HEAD WITHOUT CONTRAST TECHNIQUE: Contiguous axial images were obtained from the base of the skull through the vertex without intravenous contrast. COMPARISON:  CT head 07/03/2018 FINDINGS: Brain:  Ventricle size normal. Mild atrophy. Mild patchy hypodensity in the periventricular white matter is chronic and unchanged. Negative for acute infarct.  Negative for hemorrhage or mass. Vascular: Slight hyperdensity in the right middle cerebral artery could represent artifact. Skull: Negative Sinuses/Orbits: Negative Other: Right frontal scalp contusion, question injury ASPECTS (Alberta Stroke Program Early CT Score) - Ganglionic level infarction (caudate, lentiform nuclei, internal capsule, insula, M1-M3 cortex): 7 - Supraganglionic infarction (M4-M6 cortex): 3 Total score (0-10 with 10 being normal): 10 IMPRESSION: 1.    No acute infarct or hemorrhage 2. Slight hyperdensity right MCA likely artifact given the patient's right-sided weakness. 3. ASPECTS is 10 4. Right frontal soft tissue contusion, question injury 5. These results were called by telephone at the time of interpretation on 02/05/2019 at 7:04 pm to Dr. Wilford CornerArora , who verbally acknowledged these results. Electronically Signed   By: Marlan Palauharles  Clark M.D.   On: 02/05/2019 19:08     Procedures .Critical Care Performed by: Pricilla LovelessGoldston, Moises Terpstra, MD Authorized by: Pricilla LovelessGoldston, Jimi Schappert, MD   Critical care provider statement:    Critical care time (minutes):  45   Critical care time was exclusive of:  Separately billable procedures and treating other patients   Critical care was necessary to treat or prevent imminent or life-threatening deterioration of the following conditions:  Endocrine crisis and CNS failure or compromise   Critical care was time spent personally by me on the following activities:  Discussions with consultants, evaluation of patient's response to treatment, examination of patient, ordering and performing treatments and interventions, ordering and review of laboratory studies, ordering and review of radiographic studies, pulse oximetry, re-evaluation of patient's condition and review of old charts   (including critical care time)  Medications  Ordered in ED Medications  sodium chloride flush (NS) 0.9 % injection 3 mL (has no administration in time range)  enoxaparin (LOVENOX) injection 40 mg (has no administration in time range)  dextrose 5 %-0.45 % sodium chloride infusion (has no administration in time range)  insulin regular bolus via infusion 0-10 Units (has no administration in time range)  insulin regular, human (MYXREDLIN) 100 units/ 100 mL infusion (3.4 Units/hr Intravenous New Bag/Given 02/05/19 2224)  dextrose 50 % solution 25 mL (has no administration in time range)  0.9 %  sodium chloride infusion ( Intravenous New Bag/Given 02/05/19 2206)  magnesium sulfate IVPB 2 g 50 mL (has no administration in time range)  sodium phosphate 20 mmol in dextrose 5 % 250 mL infusion (has no administration in time range)  LORazepam (ATIVAN) 2 MG/ML injection (has no administration in time range)  potassium chloride 10 mEq in 100 mL IVPB (has no administration in time range)  sodium chloride 0.9 % 1,000 mL with thiamine 100 mg, folic acid 1 mg, multivitamins adult 10 mL infusion (has no administration in time range)  thiamine (B-1) injection 100 mg (has no administration in time range)  folic acid injection 1 mg (has no administration in time range)  LORazepam (ATIVAN) injection 1-2 mg (has no administration in time range)  cloNIDine (CATAPRES) tablet 0.1 mg (has no administration in time range)  iohexol (OMNIPAQUE) 350 MG/ML injection 100 mL (100 mLs Intravenous Contrast Given 02/05/19 1911)  LORazepam (ATIVAN) injection 2 mg (2 mg Intravenous Given 02/05/19 1922)  levETIRAcetam (KEPPRA) IVPB 1000 mg/100 mL premix (0 mg Intravenous Stopped 02/05/19 2023)  fosPHENYtoin (CEREBYX) 1,000 mg PE in sodium chloride 0.9 % 50 mL IVPB (0 mg PE Intravenous Stopped 02/05/19 2032)  lactated ringers bolus 1,000 mL (0 mLs Intravenous Stopped 02/05/19 2205)  LORazepam (ATIVAN) injection 2 mg (2 mg Intravenous Given 02/05/19 2120)  LORazepam (ATIVAN) injection  2 mg (2 mg Intravenous Given 02/05/19 2019)  lactated ringers bolus 1,000 mL (1,000 mLs Intravenous New Bag/Given 02/05/19 2204)     Initial Impression / Assessment and Plan / ED Course  I have reviewed the triage vital signs and the nursing notes.  Pertinent labs & imaging results that were available during my care of the patient were reviewed by me and considered in my medical decision making (see chart for details).        Patient strokelike symptoms are probably from a metabolic derangement.  CT angiography did not show an obvious large vessel occlusion.  She was taken emergently to MRI by neurology where she had a negative MRI.  Brought back here and her lab work is concerning for DKA.  With her significant hypokalemia, she was not started on insulin but was first given IV fluids and will need potassium replacement.  Also low magnesium.  Intermittently has become agitated and given IV Ativan.  She is starting to move her right side a  lot better and her eyes are back to midline.  She could have been seizing or having functional strokelike symptoms from the metabolic derangements.  No obvious infectious cause.  Have discussed with neurology for their recommendations.  She will need ICU monitoring for neuro checks and treatment of her metabolic derangements.  She did come in with a c-collar though she has a negative CT of her C-spine.  She is moving her neck already and I think the c-collar is currently doing more harm than good at this point.  I think ligamentous injury is pretty unlikely from what appears to be a ground-level fall.  Will remove c-collar.  Admit to the ICU.  Final Clinical Impressions(s) / ED Diagnoses   Final diagnoses:  Diabetic ketoacidosis without coma associated with type 2 diabetes mellitus (HCC)  Acute metabolic encephalopathy  Hypokalemia  Hypomagnesemia    ED Discharge Orders    None       Pricilla Loveless, MD 02/05/19 2304

## 2019-02-05 NOTE — ED Notes (Signed)
Pt requiring nearly constant redirection to keep arms at sides for IVF infusion

## 2019-02-05 NOTE — ED Notes (Signed)
Delay in lab draw,  Pt not in room at this time. 

## 2019-02-05 NOTE — ED Notes (Signed)
Pt placed on 2L 02 by [place] d/t 02 sat of 94%

## 2019-02-05 NOTE — ED Notes (Signed)
Pt appears to be hallucinating, reaching L arm into the air attempting to grab something.

## 2019-02-05 NOTE — Consult Note (Addendum)
Neurology Consultation  Reason for Consult: Code stroke Referring Physician: Dr. Pricilla Loveless  CC: Right-sided weakness  History is obtained from: EMS  HPI: Shannon Herrera is a 63 y.o. 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 History was limited and provided by EMS.  They were called there by some residents at the group home who noticed that she could not get up from the ground and was unable to move her right side or talk. 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  Modified Rankin 0 The phone number that the EMTs provided was first for a coworker who was not familiar with her medical history. Chart review reveals thrombocytopenia, history of alcohol abuse, diabetes, hypertension. Patient was unable to provide any history at this time. Last seen well was 3:30 PM but the gentleman that I spoke to over the phone and her facility said that it might have been a little later than that as well.  EMTs had a blood pressure systolic 220s Fingerstick blood sugar on site was greater than 500  LKW: 3:30 PM on 02/05/2019 tpa given?: no, no stroke on imaging, patient in diabetic ketoacidosis Premorbid modified Rankin scale (mRS):0  ROS: Unable to obtain due to altered mental status  Past Medical History:  Diagnosis Date  . Diabetes mellitus without complication (HCC)   . Hypertension   . Hypothyroid     Family History  Problem Relation Age of Onset  . Hypertension Mother     Social History:   reports that she has never smoked. She has never used smokeless tobacco. She reports current alcohol use. She reports that she does not use drugs. History of alcohol abuse in the chart.  Unable to obtain from the patient.  Medications  Current Facility-Administered Medications:  .  sodium chloride flush (NS) 0.9 %  injection 3 mL, 3 mL, Intravenous, Once, Pricilla Loveless, MD  Current Outpatient Medications:  .  levothyroxine (SYNTHROID, LEVOTHROID) 100 MCG tablet, Take 1 tablet (100 mcg total) by mouth daily before breakfast., Disp: 30 tablet, Rfl: 2 .  lovastatin (MEVACOR) 20 MG tablet, Take 40 mg by mouth at bedtime., Disp: , Rfl: 5 .  metFORMIN (GLUCOPHAGE) 1000 MG tablet, Take 1 tablet (1,000 mg total) by mouth 2 (two) times daily with a meal., Disp: 60 tablet, Rfl: 3 .  metoprolol tartrate (LOPRESSOR) 50 MG tablet, Take 1 tablet (50 mg total) by mouth 2 (two) times daily., Disp: 60 tablet, Rfl: 2 .  ondansetron (ZOFRAN ODT) 4 MG disintegrating tablet, Take 1 tablet (4 mg total) by mouth every 8 (eight) hours as needed for nausea or vomiting., Disp: 20 tablet, Rfl: 0 .  potassium chloride SA (K-DUR,KLOR-CON) 20 MEQ tablet, Take 1 tablet (20 mEq total) by mouth daily., Disp: 10 tablet, Rfl: 0 .  sertraline (ZOLOFT) 25 MG tablet, Take 1 tablet (25 mg total) by mouth daily., Disp: 30 tablet, Rfl: 0  Exam: Current vital signs: BP (!) 191/86   Pulse 98   Temp 98.1 F (36.7 C)   Resp (!) 22   Wt 57.1 kg   BMI 23.02 kg/m  Vital signs in last 24 hours: Temp:  [98.1 F (36.7 C)] 98.1 F (36.7 C) (04/26 1850) Pulse Rate:  [98] 98 (04/26 1850) Resp:  [22] 22 (04/26 1850) BP: (191)/(86) 191/86 (  04/26 1850) Weight:  [57.1 kg] 57.1 kg (04/26 1800) General: Patient is awake alert in no apparent distress HEENT: Normocephalic atraumatic has a swelling over the right temple and right forehead Lungs: Clear to auscultation CVS: S1-S2 heard regular rate rhythm Abdomen soft nondistended nontender Extremities warm well perfused with no edema Neurological exam Mental status: She is awake alert and completely nonverbal. She does not follow any commands.  Unable to assess articulation of her speech because of the above said. Cranial nerves: Her pupils are equal round reactive to light, she has a forced leftward  gaze, she does not blink to threat from the right, she blinks to threat from the left, she has a right lower facial complete facial paralysis. Motor exam: Right upper extremity is flaccid 0 out of 5 right lower extremity is at least a 3 out of 5 but unable to sustain it antigravity per command.  Left upper and lower extremities are full strength 5/5. Sensory exam: Appears intact on the right lower extremity, left upper extremity and left lower extremity.  Appears reduced based on no grimace on the right upper extremity. Coordination cannot be tested She seems to be extinguishing the right side.  NIHSS -25   Labs I have reviewed labs in epic and the results pertinent to this consultation are:  CBC    Component Value Date/Time   WBC 2.2 (L) 07/06/2018 0547   RBC 2.94 (L) 07/06/2018 0547   HGB 9.1 (L) 07/06/2018 0547   HCT 26.4 (L) 07/06/2018 0547   PLT 47 (L) 07/06/2018 0547   MCV 89.8 07/06/2018 0547   MCH 30.9 07/06/2018 0547   MCHC 34.4 07/06/2018 0547   RDW 14.1 07/06/2018 0547   LYMPHSABS 1.5 07/03/2018 1613   MONOABS 0.3 07/03/2018 1613   EOSABS 0.0 07/03/2018 1613   BASOSABS 0.1 07/03/2018 1613    CMP     Component Value Date/Time   NA 134 (L) 02/05/2019 1842   K 2.8 (L) 02/05/2019 1842   CL 97 (L) 02/05/2019 1842   CO2 19 (L) 02/05/2019 1842   GLUCOSE 511 (HH) 02/05/2019 1842   BUN 15 02/05/2019 1842   CREATININE 0.80 02/05/2019 1847   CALCIUM 10.1 02/05/2019 1842   PROT 7.2 02/05/2019 1842   ALBUMIN 4.3 02/05/2019 1842   AST 33 02/05/2019 1842   ALT 28 02/05/2019 1842   ALKPHOS 67 02/05/2019 1842   BILITOT 0.8 02/05/2019 1842   GFRNONAA 50 (L) 02/05/2019 1842   GFRAA 58 (L) 02/05/2019 1842   Imaging I have reviewed the images obtained:  CT-scan of the brain-aspects 10.  No bleed CTA head and neck-no acute changes no LVO. MRI of the brain- could not stay still, only diffusion images obtained, motion degraded but no obvious large vessel large territory MCA  stroke  Assessment: 63 year old woman with past medical history of diabetes, hypertension and call abuse was brought in as an acute code stroke for right-sided weakness and aphasia. She had a leftward gaze on exam, right hemiparesis, right homonymous hemianopsia and right facial weakness along with right-sided sensory loss could very concerning for large vessel occlusion.  CT imaging in the form of CT of the head and CT angios head and neck as well as CT perfusion study did not show any LVO or CT perfusion deficits. Her labs revealed severe hyperglycemia with diabetic ketoacidosis. Her symptoms are likely related to the hyperglycemia.  This could also be a provoked seizure. She was given 2 mg of Ativan and loaded  with Keppra and fosphenytoin.  Of note her labs did not reveal any alcohol level in her blood and she has a history of alcohol intoxication and has multiple hospitals and ER visits for that- this could reflect alcohol withdrawal seizure as well  Although her exam was suggestive for LVO stroke-there was no LVO on imaging.  Hence not a candidate for thrombectomy. Has history of thrombocytopenia with last platelet count 47,000.  Had to wait for labs to come back and today's platelet count is 103,000.  In the meantime, we got an MRI and because it did not show any evolving stroke, TPA was not offered.  Impression: Right-sided weakness, right sided visual field loss, right facial weakness- evaluate for stroke versus seizure versus focal neurological deficit from hyperglycemia  MR imaging incomplete and degraded by motion but negative for acute stroke CTA negative for LVO CTH negative for bleed  Recommendations: I have advised 2 mg of Ativan that was given to her. Load with Keppra 1 g IV x1 now Loaded with fosphenytoin 1000 mg IV x1 now Continue Keppra 500 twice daily Continue Dilantin 100 3 times daily Might need standing doses of Ativan for concern for alcohol withdrawal-consider CIWA  protocol Seizure precautions Repeat exam-if still has a leftward gaze on exam and hemiparesis on the right, a stat EEG might be helpful. I will sign out her care to my oncoming neuro hospitalist colleague. I have discussed my plan with the ED provider in person as well as the patient's RN in person.  Based on her labs and exam, it does not look like she has any CNS infection so I am not inclined towards a spinal tap at this time.  I also discussed this in detail with the ED provider.  -- Milon Dikes, MD [facility] Pager: [phone] If 7pm to 7am, please call on call as listed on AMION.   CRITICAL CARE ATTESTATION Performed by: Milon Dikes, MD Total critical care time: 60 minutes minutes Critical care time was exclusive of separately billable procedures and treating other patients and/or supervising APPs/Residents/Students Critical care was necessary to treat or prevent imminent or life-threatening deterioration due to seizure versus stroke This patient is critically ill and at significant risk for neurological worsening and/or death and care requires constant monitoring. Critical care was time spent personally by me on the following activities: development of treatment plan with patient and/or surrogate as well as nursing, discussions with consultants, evaluation of patient's response to treatment, examination of patient, obtaining history from patient or surrogate, ordering and performing treatments and interventions, ordering and review of laboratory studies, ordering and review of radiographic studies, pulse oximetry, re-evaluation of patient's condition, participation in multidisciplinary rounds and medical decision making of high complexity in the care of this patient.

## 2019-02-05 NOTE — Procedures (Signed)
Central Venous Catheter Insertion Procedure Note Shannon Herrera [id number] Aug 05, 1956  Procedure: Insertion of Central Venous Catheter Indications: Assessment of intravascular volume, Drug and/or fluid administration and Frequent blood sampling  Procedure Details Consent: Unable to obtain consent because of emergent medical necessity. Time Out: Verified patient identification, verified procedure, site/side was marked, verified correct patient position, special equipment/implants available, medications/allergies/relevent history reviewed, required imaging and test results available.  Performed  Maximum sterile technique was used including antiseptics, cap, gloves, gown, hand hygiene, mask and sheet. Skin prep: Chlorhexidine; local anesthetic administered A antimicrobial bonded/coated triple lumen catheter was placed in the right internal jugular vein using the Seldinger technique.  Evaluation Blood flow good Complications: No apparent complications Patient did tolerate procedure well. Chest X-ray ordered to verify placement.  CXR: normal.  Shannon Herrera Shannon Herrera 02/05/2019, 10:46 PM

## 2019-02-05 NOTE — ED Notes (Signed)
ED TO INPATIENT HANDOFF REPORT  ED Nurse Name and Phone #:  Raina Mina [phone]  S Name/Age/Gender Shannon Herrera 63 y.o. female Room/Bed: TRAAC/TRAAC  Code Status   Code Status: Full Code  Home/SNF/Other Home AAO x0 Is this baseline? 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 Pt was LSN at 1530. Members of the group home left to drive. When they got back they found her altered and stumbled, multiple witnessed falls. Pt has lumps and bruising to forehead. 18G PIV to LAC and RAC placed by EMs. Initial CBG 580. Pt has hx of diabetes and ETOH. Pt has left sided gaze. C collar in place. She has movement to both legs. No effort against gravity to right arm, non verbal, cannot follow commands.     Allergies No Known Allergies  Level of Care/Admitting Diagnosis ED Disposition    ED Disposition Condition Comment   Admit  Hospital Area: [facility] [[id number]]  Level of Care: ICU [6]  Covid Evaluation: N/A  Diagnosis: Acute metabolic encephalopathy [1914782]  Admitting Physician: Carin Hock [[id number]]  Attending Physician: Carin Hock [[id number]]  Estimated length of stay: > 1 week  Certification:: I certify this patient will need inpatient services for at least 2 midnights  PT Class (Do Not Modify): Inpatient [101]  PT Acc Code (Do Not Modify): Private [1]       B Medical/Surgery History Past Medical History:  Diagnosis Date  . Diabetes mellitus without complication (HCC)   . Hypertension   . Hypothyroid    Past Surgical History:  Procedure Laterality Date  . denies       A IV Location/Drains/Wounds Patient Lines/Drains/Airways Status   Active Line/Drains/Airways    Name:   Placement date:   Placement time:   Site:   Days:   Peripheral IV 02/05/19 Right Antecubital   02/05/19    1923    Antecubital   less than 1   Peripheral IV 02/05/19  Left Antecubital   02/05/19    1923    Antecubital   less than 1   CVC Triple Lumen 02/05/19 Right Internal jugular 18 cm 15 cm   02/05/19    2130     less than 1   Urethral Catheter Autumn, RN  14 Fr.   02/05/19    2150    -   less than 1          Intake/Output Last 24 hours No intake or output data in the 24 hours ending 02/05/19 2224  Labs/Imaging Results for orders placed or performed during the hospital encounter of 02/05/19 (from the past 48 hour(s))  Protime-INR     Status: None   Collection Time: 02/05/19  6:42 PM  Result Value Ref Range   Prothrombin Time 13.4 11.4 - 15.2 seconds   INR 1.0 0.8 - 1.2    Comment: (NOTE) INR goal varies based on device and disease states. Performed at [facility], [address], [place], [place] [address]   APTT     Status: Abnormal   Collection Time: 02/05/19  6:42 PM  Result Value Ref Range   aPTT 20 (L) 24 - 36 seconds    Comment: Performed at [facility], [address] [place], [place] [address]  CBC     Status: Abnormal   Collection Time: 02/05/19  6:42 PM  Result Value Ref Range   WBC 4.3 4.0 - 10.5 K/uL   RBC 3.56 (L) 3.87 - 5.11 MIL/uL   Hemoglobin 11.1 (L) 12.0 - 15.0 g/dL   HCT 16.1 (L) 09.6 - 04.5 %   MCV 92.4 80.0 - 100.0 fL   MCH 31.2 26.0 - 34.0 pg   MCHC 33.7 30.0 - 36.0 g/dL   RDW 40.9 81.1 - 91.4 %   Platelets 102 (L) 150 - 400 K/uL    Comment: REPEATED TO VERIFY PLATELET COUNT CONFIRMED BY SMEAR SPECIMEN CHECKED FOR CLOTS Immature Platelet Fraction may be clinically indicated, consider ordering this additional test [id number]    nRBC 0.0 0.0 - 0.2 %    Comment: Performed at [facility], [address], [place], [place] [address]  Differential     Status: None   Collection Time: 02/05/19  6:42 PM  Result Value Ref Range   Neutrophils Relative % 66 %   Neutro Abs 2.8 1.7 - 7.7 K/uL   Lymphocytes Relative 27 %   Lymphs Abs 1.2 0.7 - 4.0 K/uL   Monocytes Relative 6 %   Monocytes  Absolute 0.2 0.1 - 1.0 K/uL   Eosinophils Relative 1 %   Eosinophils Absolute 0.0 0.0 - 0.5 K/uL   Basophils Relative 0 %   Basophils Absolute 0.0 0.0 - 0.1 K/uL   Immature Granulocytes 0 %   Abs Immature Granulocytes 0.00 0.00 - 0.07 K/uL    Comment: Performed at [facility], [address], [place], [place] [address]  Comprehensive metabolic panel     Status: Abnormal   Collection Time: 02/05/19  6:42 PM  Result Value Ref Range   Sodium 134 (L) 135 - 145 mmol/L   Potassium 2.8 (L) 3.5 - 5.1 mmol/L   Chloride 97 (L) 98 - 111 mmol/L   CO2 19 (L) 22 - 32 mmol/L   Glucose, Bld 511 (HH) 70 - 99 mg/dL    Comment: CRITICAL RESULT CALLED TO, READ BACK BY AND VERIFIED WITH: Lissy Deuser RN AY 1935 02/05/2019 BY WOOLLENK    BUN 15 8 - 23 mg/dL   Creatinine, Ser 6.29 (H) 0.44 - 1.00 mg/dL   Calcium 52.8 8.9 - 41.3 mg/dL   Total Protein 7.2 6.5 - 8.1 g/dL   Albumin 4.3 3.5 - 5.0 g/dL   AST 33 15 - 41 U/L   ALT 28 0 - 44 U/L   Alkaline Phosphatase 67 38 - 126 U/L   Total Bilirubin 0.8 0.3 - 1.2 mg/dL   GFR calc non Af Amer 50 (L) >60 mL/min   GFR calc Af Amer 58 (L) >60 mL/min   Anion gap 18 (H) 5 - 15    Comment: Performed at [facility], [address] [place], [place] [address]  Troponin I - Once     Status: None   Collection Time: 02/05/19  6:42 PM  Result Value Ref Range   Troponin I <0.03 <0.03 ng/mL    Comment: Performed at [facility], [address], [place], [place] [address]  Magnesium     Status: Abnormal   Collection Time: 02/05/19  6:42 PM  Result Value Ref Range   Magnesium 1.4 (L) 1.7 - 2.4 mg/dL    Comment: Performed at [facility], [address], [place], [place] [address]  CBG monitoring, ED     Status: Abnormal   Collection Time: 02/05/19  6:43 PM  Result Value Ref Range  Glucose-Capillary 470 (H) 70 - 99 mg/dL   Comment 1 Notify RN    Comment 2 Call MD NNP PA CNM   Ethanol     Status: None   Collection Time: 02/05/19  6:46 PM   Result Value Ref Range   Alcohol, Ethyl (B) <10 <10 mg/dL    Comment: (NOTE) Lowest detectable limit for serum alcohol is 10 mg/dL. For medical purposes only. Performed at [facility], [address] [place], [place] [address]   CK     Status: None   Collection Time: 02/05/19  6:46 PM  Result Value Ref Range   Total CK 63 38 - 234 U/L    Comment: Performed at [facility], [address] [place], [place] [address]  I-stat Creatinine, ED     Status: None   Collection Time: 02/05/19  6:47 PM  Result Value Ref Range   Creatinine, Ser 0.80 0.44 - 1.00 mg/dL  POCT I-Stat EG7     Status: Abnormal   Collection Time: 02/05/19  8:26 PM  Result Value Ref Range   pH, Ven 7.412 7.250 - 7.430   pCO2, Ven 37.0 (L) 44.0 - 60.0 mmHg   pO2, Ven 25.0 (LL) 32.0 - 45.0 mmHg   Bicarbonate 23.6 20.0 - 28.0 mmol/L   TCO2 25 22 - 32 mmol/L   O2 Saturation 46.0 %   Acid-base deficit 1.0 0.0 - 2.0 mmol/L   Sodium 135 135 - 145 mmol/L   Potassium 2.8 (L) 3.5 - 5.1 mmol/L   Calcium, Ion 1.16 1.15 - 1.40 mmol/L   HCT 35.0 (L) 36.0 - 46.0 %   Hemoglobin 11.9 (L) 12.0 - 15.0 g/dL   Patient temperature HIDE    Sample type VENOUS    Comment NOTIFIED PHYSICIAN   Urinalysis, Routine w reflex microscopic     Status: Abnormal   Collection Time: 02/05/19 10:02 PM  Result Value Ref Range   Color, Urine COLORLESS (A) YELLOW   APPearance CLEAR CLEAR   Specific Gravity, Urine 1.011 1.005 - 1.030   pH 7.0 5.0 - 8.0   Glucose, UA >=500 (A) NEGATIVE mg/dL   Hgb urine dipstick NEGATIVE NEGATIVE   Bilirubin Urine NEGATIVE NEGATIVE   Ketones, ur 5 (A) NEGATIVE mg/dL   Protein, ur NEGATIVE NEGATIVE mg/dL   Nitrite NEGATIVE NEGATIVE   Leukocytes,Ua NEGATIVE NEGATIVE   RBC / HPF 0-5 0 - 5 RBC/hpf   Bacteria, UA NONE SEEN NONE SEEN    Comment: Performed at [facility], [address] [place], [place] [address]   Ct Angio Head W Or Wo Contrast  Result Date: 02/05/2019 CLINICAL DATA:   Initial evaluation for acute right-sided weakness, nonverbal. EXAM: CT ANGIOGRAPHY HEAD AND NECK CT PERFUSION BRAIN TECHNIQUE: Multidetector CT imaging of the head and neck was performed using the standard protocol during bolus administration of intravenous contrast. Multiplanar CT image reconstructions and MIPs were obtained to evaluate the vascular anatomy. Carotid stenosis measurements (when applicable) are obtained utilizing NASCET criteria, using the distal internal carotid diameter as the denominator. Multiphase CT imaging of the brain was performed following IV bolus contrast injection. Subsequent parametric perfusion maps were calculated using RAPID software. CONTRAST:  100mL OMNIPAQUE IOHEXOL 350 MG/ML SOLN COMPARISON:  Prior CT from earlier the same day. FINDINGS: CTA NECK FINDINGS Aortic arch: Visualized aortic arch of normal caliber with normal 3 vessel morphology. No flow-limiting stenosis about the origin of the great vessels. Visualized subclavian arteries widely patent. Right  carotid system: Right common carotid artery patent from its origin to the bifurcation without stenosis. No significant atheromatous narrowing about the right bifurcation. Right ICA mildly tortuous but widely patent to the skull base without stenosis, dissection, or occlusion. Left carotid system: Left common carotid artery patent from its origin to the bifurcation without stenosis. Mild a centric calcified plaque about the left bifurcation without hemodynamically significant stenosis. Left ICA mildly tortuous but widely patent distally to the skull base without stenosis, dissection or occlusion. Vertebral arteries: Both of the vertebral arteries arise from the subclavian arteries. Vertebral arteries widely patent within the neck without stenosis, dissection, or occlusion. Skeleton: No acute osseous finding. No discrete lytic or blastic osseous lesions. Mild to moderate cervical spondylolysis at C5-6 and C6-7. Other neck: No  other acute soft tissue abnormality within the neck. Salivary glands within normal limits. Thyroid normal. No adenopathy. Upper chest: Visualized upper chest within normal limits. Visualized lungs are clear. Review of the MIP images confirms the above findings CTA HEAD FINDINGS Anterior circulation: Petrous segments widely patent bilaterally. Mild scattered atheromatous irregularity within the cavernous/supraclinoid ICAs without flow-limiting stenosis. ICA termini well perfused. A1 segments patent bilaterally. Normal anterior communicating artery. Anterior cerebral arteries patent to their distal aspects without hemodynamically significant stenosis. M1 segments patent bilaterally. Normal MCA bifurcations. Distal MCA branches well perfused and symmetric. Posterior circulation: Vertebral arteries patent to the vertebrobasilar junction without flow-limiting stenosis. Posterior inferior cerebral arteries patent bilaterally. Basilar artery diminutive but patent to its distal aspect without stenosis. Superior cerebral arteries patent bilaterally. Left PCA supplied via the basilar. Predominant fetal type origin of the right PCA PCAs patent to their distal aspects without flow-limiting stenosis or occlusion. Venous sinuses: Grossly patent allowing for timing of the contrast bolus. Anatomic variants: Fetal type origin of the right PCA. Overall diminutive vertebrobasilar system. Delayed phase: Not performed. Review of the MIP images confirms the above findings CT Brain Perfusion Findings: ASPECTS: 10 CBF (<30%) Volume: 0mL Perfusion (Tmax>6.0s) volume: 0mL Mismatch Volume: 0mL Infarction Location:Negative CT perfusion for acute ischemic infarct. IMPRESSION: 1. Negative CTA for large vessel occlusion. 2. Negative CT perfusion. No evidence for core infarct or perfusion abnormality. 3. Mild atherosclerotic change about the left carotid bifurcation and carotid siphons for patient age. No hemodynamically significant or correctable  stenosis. Critical Value/emergent results were called by telephone at the time of interpretation on 02/05/2019 at 7:15 pm to Dr. Milon Dikes , who verbally acknowledged these results. Electronically Signed   By: Rise Mu M.D.   On: 02/05/2019 19:56   Ct Angio Neck W Or Wo Contrast  Result Date: 02/05/2019 CLINICAL DATA:  Initial evaluation for acute right-sided weakness, nonverbal. EXAM: CT ANGIOGRAPHY HEAD AND NECK CT PERFUSION BRAIN TECHNIQUE: Multidetector CT imaging of the head and neck was performed using the standard protocol during bolus administration of intravenous contrast. Multiplanar CT image reconstructions and MIPs were obtained to evaluate the vascular anatomy. Carotid stenosis measurements (when applicable) are obtained utilizing NASCET criteria, using the distal internal carotid diameter as the denominator. Multiphase CT imaging of the brain was performed following IV bolus contrast injection. Subsequent parametric perfusion maps were calculated using RAPID software. CONTRAST:  OMNIPAQUE IOHEXOL 350 MG/ML SOLN COMPARISON:  Prior CT from earlier the same day. FINDINGS: CTA NECK FINDINGS Aortic arch: Visualized aortic arch of normal caliber with normal 3 vessel morphology. No flow-limiting stenosis about the origin of the great vessels. Visualized subclavian arteries widely patent. Right carotid system: Right common carotid artery patent from  its origin to the bifurcation without stenosis. No significant atheromatous narrowing about the right bifurcation. Right ICA mildly tortuous but widely patent to the skull base without stenosis, dissection, or occlusion. Left carotid system: Left common carotid artery patent from its origin to the bifurcation without stenosis. Mild a centric calcified plaque about the left bifurcation without hemodynamically significant stenosis. Left ICA mildly tortuous but widely patent distally to the skull base without stenosis, dissection or occlusion.  Vertebral arteries: Both of the vertebral arteries arise from the subclavian arteries. Vertebral arteries widely patent within the neck without stenosis, dissection, or occlusion. Skeleton: No acute osseous finding. No discrete lytic or blastic osseous lesions. Mild to moderate cervical spondylolysis at C5-6 and C6-7. Other neck: No other acute soft tissue abnormality within the neck. Salivary glands within normal limits. Thyroid normal. No adenopathy. Upper chest: Visualized upper chest within normal limits. Visualized lungs are clear. Review of the MIP images confirms the above findings CTA HEAD FINDINGS Anterior circulation: Petrous segments widely patent bilaterally. Mild scattered atheromatous irregularity within the cavernous/supraclinoid ICAs without flow-limiting stenosis. ICA termini well perfused. A1 segments patent bilaterally. Normal anterior communicating artery. Anterior cerebral arteries patent to their distal aspects without hemodynamically significant stenosis. M1 segments patent bilaterally. Normal MCA bifurcations. Distal MCA branches well perfused and symmetric. Posterior circulation: Vertebral arteries patent to the vertebrobasilar junction without flow-limiting stenosis. Posterior inferior cerebral arteries patent bilaterally. Basilar artery diminutive but patent to its distal aspect without stenosis. Superior cerebral arteries patent bilaterally. Left PCA supplied via the basilar. Predominant fetal type origin of the right PCA PCAs patent to their distal aspects without flow-limiting stenosis or occlusion. Venous sinuses: Grossly patent allowing for timing of the contrast bolus. Anatomic variants: Fetal type origin of the right PCA. Overall diminutive vertebrobasilar system. Delayed phase: Not performed. Review of the MIP images confirms the above findings CT Brain Perfusion Findings: ASPECTS: 10 CBF (<30%) Volume: 0mL Perfusion (Tmax>6.0s) volume: 0mL Mismatch Volume: 0mL Infarction  Location:Negative CT perfusion for acute ischemic infarct. IMPRESSION: 1. Negative CTA for large vessel occlusion. 2. Negative CT perfusion. No evidence for core infarct or perfusion abnormality. 3. Mild atherosclerotic change about the left carotid bifurcation and carotid siphons for patient age. No hemodynamically significant or correctable stenosis. Critical Value/emergent results were called by telephone at the time of interpretation on 02/05/2019 at 7:15 pm to Dr. Milon Dikes , who verbally acknowledged these results. Electronically Signed   By: Rise Mu M.D.   On: 02/05/2019 19:56   Mr Brain Wo Contrast  Result Date: 02/05/2019 CLINICAL DATA:  63 year old female code stroke with right side weakness. Agitated. EXAM: MRI HEAD WITHOUT CONTRAST TECHNIQUE: Multiplanar, multiecho pulse sequences of the brain and surrounding structures were obtained without intravenous contrast. COMPARISON:  CTA head and neck, CT perfusion today. FINDINGS: The examination had to be discontinued prior to completion due to patient agitation. Axial and coronal diffusion weighted imaging and motion degraded axial SWI only is obtained. No convincing No restricted diffusion or evidence of acute infarction. No midline shift, ventriculomegaly, or intracranial mass effect. SWI imaging is motion degraded, no obvious cerebral blood products. Abnormal diffusion in the right scalp soft tissues compatible with scalp hematoma along the right anterior frontal convexity (series 5, image 93). IMPRESSION: Limited noncontrast brain MRI appears negative for acute infarct. This was communicated to Dr. Otelia Limes at 7:57 pm on 02/05/2019 by text page via the [facility] messaging system. Electronically Signed   By: Odessa Fleming M.D.   On: 02/05/2019 19:57  Ct Cerebral Perfusion W Contrast  Result Date: 02/05/2019 CLINICAL DATA:  Initial evaluation for acute right-sided weakness, nonverbal. EXAM: CT ANGIOGRAPHY HEAD AND NECK CT PERFUSION BRAIN  TECHNIQUE: Multidetector CT imaging of the head and neck was performed using the standard protocol during bolus administration of intravenous contrast. Multiplanar CT image reconstructions and MIPs were obtained to evaluate the vascular anatomy. Carotid stenosis measurements (when applicable) are obtained utilizing NASCET criteria, using the distal internal carotid diameter as the denominator. Multiphase CT imaging of the brain was performed following IV bolus contrast injection. Subsequent parametric perfusion maps were calculated using RAPID software. CONTRAST:  OMNIPAQUE IOHEXOL 350 MG/ML SOLN COMPARISON:  Prior CT from earlier the same day. FINDINGS: CTA NECK FINDINGS Aortic arch: Visualized aortic arch of normal caliber with normal 3 vessel morphology. No flow-limiting stenosis about the origin of the great vessels. Visualized subclavian arteries widely patent. Right carotid system: Right common carotid artery patent from its origin to the bifurcation without stenosis. No significant atheromatous narrowing about the right bifurcation. Right ICA mildly tortuous but widely patent to the skull base without stenosis, dissection, or occlusion. Left carotid system: Left common carotid artery patent from its origin to the bifurcation without stenosis. Mild a centric calcified plaque about the left bifurcation without hemodynamically significant stenosis. Left ICA mildly tortuous but widely patent distally to the skull base without stenosis, dissection or occlusion. Vertebral arteries: Both of the vertebral arteries arise from the subclavian arteries. Vertebral arteries widely patent within the neck without stenosis, dissection, or occlusion. Skeleton: No acute osseous finding. No discrete lytic or blastic osseous lesions. Mild to moderate cervical spondylolysis at C5-6 and C6-7. Other neck: No other acute soft tissue abnormality within the neck. Salivary glands within normal limits. Thyroid normal. No  adenopathy. Upper chest: Visualized upper chest within normal limits. Visualized lungs are clear. Review of the MIP images confirms the above findings CTA HEAD FINDINGS Anterior circulation: Petrous segments widely patent bilaterally. Mild scattered atheromatous irregularity within the cavernous/supraclinoid ICAs without flow-limiting stenosis. ICA termini well perfused. A1 segments patent bilaterally. Normal anterior communicating artery. Anterior cerebral arteries patent to their distal aspects without hemodynamically significant stenosis. M1 segments patent bilaterally. Normal MCA bifurcations. Distal MCA branches well perfused and symmetric. Posterior circulation: Vertebral arteries patent to the vertebrobasilar junction without flow-limiting stenosis. Posterior inferior cerebral arteries patent bilaterally. Basilar artery diminutive but patent to its distal aspect without stenosis. Superior cerebral arteries patent bilaterally. Left PCA supplied via the basilar. Predominant fetal type origin of the right PCA PCAs patent to their distal aspects without flow-limiting stenosis or occlusion. Venous sinuses: Grossly patent allowing for timing of the contrast bolus. Anatomic variants: Fetal type origin of the right PCA. Overall diminutive vertebrobasilar system. Delayed phase: Not performed. Review of the MIP images confirms the above findings CT Brain Perfusion Findings: ASPECTS: 10 CBF (<30%) Volume: 86mL Perfusion (Tmax>6.0s) volume: 57mL Mismatch Volume: 40mL Infarction Location:Negative CT perfusion for acute ischemic infarct. IMPRESSION: 1. Negative CTA for large vessel occlusion. 2. Negative CT perfusion. No evidence for core infarct or perfusion abnormality. 3. Mild atherosclerotic change about the left carotid bifurcation and carotid siphons for patient age. No hemodynamically significant or correctable stenosis. Critical Value/emergent results were called by telephone at the time of interpretation on 02/05/2019  at 7:15 pm to Dr. Milon Dikes , who verbally acknowledged these results. Electronically Signed   By: Rise Mu M.D.   On: 02/05/2019 19:56   Dg Chest Portable 1 View  Result Date: 02/05/2019  CLINICAL DATA:  Central line placement EXAM: PORTABLE CHEST 1 VIEW COMPARISON:  07/03/2018 FINDINGS: Right IJ central venous catheter tip over the cavoatrial region. No pneumothorax. No focal consolidation or effusion. Normal heart size. No pneumothorax. IMPRESSION: Right IJ central venous catheter tip over the cavoatrial region. Negative for a pneumothorax Electronically Signed   By: Jasmine Pang M.D.   On: 02/05/2019 21:55   Ct Head Code Stroke Wo Contrast  Result Date: 02/05/2019 CLINICAL DATA:  Code stroke.  Possible stroke.  Right weakness. EXAM: CT HEAD WITHOUT CONTRAST TECHNIQUE: Contiguous axial images were obtained from the base of the skull through the vertex without intravenous contrast. COMPARISON:  CT head 07/03/2018 FINDINGS: Brain: Ventricle size normal. Mild atrophy. Mild patchy hypodensity in the periventricular white matter is chronic and unchanged. Negative for acute infarct.  Negative for hemorrhage or mass. Vascular: Slight hyperdensity in the right middle cerebral artery could represent artifact. Skull: Negative Sinuses/Orbits: Negative Other: Right frontal scalp contusion, question injury ASPECTS (Alberta Stroke Program Early CT Score) - Ganglionic level infarction (caudate, lentiform nuclei, internal capsule, insula, M1-M3 cortex): 7 - Supraganglionic infarction (M4-M6 cortex): 3 Total score (0-10 with 10 being normal): 10 IMPRESSION: 1.    No acute infarct or hemorrhage 2. Slight hyperdensity right MCA likely artifact given the patient's right-sided weakness. 3. ASPECTS is 10 4. Right frontal soft tissue contusion, question injury 5. These results were called by telephone at the time of interpretation on 02/05/2019 at 7:04 pm to Dr. Wilford Corner , who verbally acknowledged these results.  Electronically Signed   By: Marlan Palau M.D.   On: 02/05/2019 19:08    Pending Labs Unresulted Labs (From admission, onward)    Start     Ordered   02/12/19 0500  Creatinine, serum  (enoxaparin (LOVENOX)    CrCl >/= 30 ml/min)  Weekly,   R    Comments:  while on enoxaparin therapy    02/05/19 2108   02/05/19 2330  Basic metabolic panel  Now then every 4 hours,   R     02/05/19 2224   02/05/19 2225  Hemoglobin A1c  Once,   R     02/05/19 2224   02/05/19 2215  Ammonia  ONCE - STAT,   STAT     02/05/19 2214   02/05/19 2107  CBC  (enoxaparin (LOVENOX)    CrCl >/= 30 ml/min)  Once,   R    Comments:  Baseline for enoxaparin therapy IF NOT ALREADY DRAWN.  Notify MD if PLT < 100 K.    02/05/19 2108   02/05/19 2107  Creatinine, serum  (enoxaparin (LOVENOX)    CrCl >/= 30 ml/min)  Once,   R    Comments:  Baseline for enoxaparin therapy IF NOT ALREADY DRAWN.    02/05/19 2108   02/05/19 2101  TSH  ONCE - STAT,   STAT     02/05/19 2101   02/05/19 2101  Vitamin B12  ONCE - STAT,   STAT     02/05/19 2101   02/05/19 1845  Urine rapid drug screen (hosp performed)  ONCE - STAT,   STAT     02/05/19 1845          Vitals/Pain Today's Vitals   02/05/19 2030 02/05/19 2100 02/05/19 2200 02/05/19 2215  BP: (!) 163/88  (!) 164/90 (!) 159/102  Pulse: (!) 120 (!) 123  (!) 123  Resp: 19 17 12  (!) 25  Temp:      SpO2: 95% 100%  100%  Weight:        Isolation Precautions No active isolations  Medications Medications  sodium chloride flush (NS) 0.9 % injection 3 mL (has no administration in time range)  enoxaparin (LOVENOX) injection 40 mg (has no administration in time range)  dextrose 5 %-0.45 % sodium chloride infusion (has no administration in time range)  insulin regular bolus via infusion 0-10 Units (has no administration in time range)  insulin regular, human (MYXREDLIN) 100 units/ 100 mL infusion (3.4 Units/hr Intravenous New Bag/Given 02/05/19 2224)  dextrose 50 % solution 25 mL  (has no administration in time range)  0.9 %  sodium chloride infusion ( Intravenous New Bag/Given 02/05/19 2206)  magnesium sulfate IVPB 2 g 50 mL (has no administration in time range)  sodium phosphate 20 mmol in dextrose 5 % 250 mL infusion (has no administration in time range)  LORazepam (ATIVAN) 2 MG/ML injection (has no administration in time range)  potassium chloride 10 mEq in 100 mL IVPB (has no administration in time range)  iohexol (OMNIPAQUE) 350 MG/ML injection 100 mL (100 mLs Intravenous Contrast Given 02/05/19 1911)  LORazepam (ATIVAN) injection 2 mg (2 mg Intravenous Given 02/05/19 1922)  levETIRAcetam (KEPPRA) IVPB 1000 mg/100 mL premix (0 mg Intravenous Stopped 02/05/19 2023)  fosPHENYtoin (CEREBYX) 1,000 mg PE in sodium chloride 0.9 % 50 mL IVPB (0 mg PE Intravenous Stopped 02/05/19 2032)  lactated ringers bolus 1,000 mL (0 mLs Intravenous Stopped 02/05/19 2205)  LORazepam (ATIVAN) injection 2 mg (2 mg Intravenous Given 02/05/19 2120)  LORazepam (ATIVAN) injection 2 mg (2 mg Intravenous Given 02/05/19 2019)  lactated ringers bolus 1,000 mL (1,000 mLs Intravenous New Bag/Given 02/05/19 2204)    Mobility non-ambulatory     Focused Assessments Neuro Assessment Handoff:  Swallow screen pass? No  Cardiac Rhythm: Sinus tachycardia NIH Stroke Scale ( + Modified Stroke Scale Criteria)  Interval: Initial Level of Consciousness (1a.)   : Not alert, requires repeated stimulation to attend, or is obtunded and requires strong or painful stimulation to make movements (not stereotyped) LOC Questions (1b. )   +: Answers neither question correctly LOC Commands (1c. )   + : Performs neither task correctly Best Gaze (2. )  +: Forced deviation Visual (3. )  +: Complete hemianopia Facial Palsy (4. )    : Partial paralysis  Motor Arm, Left (5a. )   +: No drift Motor Arm, Right (5b. )   +: Some effort against gravity Motor Leg, Left (6a. )   +: No drift Motor Leg, Right (6b. )   +: No  drift Limb Ataxia (7. ): Absent Sensory (8. )   +: Severe to total sensory loss, patient is not aware of being touched in the face, arm, and leg Best Language (9. )   +: Mute, global aphasia Dysarthria (10. ): Severe dysarthria, patient's speech is so slurred as to be unintelligible in the absence of or out of proportion to any dysphasia, or is mute/anarthric Extinction/Inattention (11.)   +: Profound hemi-inattention or extinction to more than one modality Modified SS Total  +: 17 Complete NIHSS TOTAL: 23     Neuro Assessment:   Neuro Checks:   Initial (02/05/19 1850)  Last Documented NIHSS Modified Score: 17 (02/05/19 1850) Has TPA been given? No If patient is a Neuro Trauma and patient is going to OR before floor call report to 4N Charge nurse: [phone] or [phone]     R Recommendations: See Admitting Provider Note  Report given  to:   Additional Notes:  Pt needs lots of redirecting, mitts on now d/t pt grabbing at things. Pt has agitated movements on and off. When she seems to be seizing she starts to snore

## 2019-02-05 NOTE — H&P (Addendum)
..   NAME:  Shannon Herrera, MRN:  [id number], DOB:  [birth year], LOS: 0 ADMISSION DATE:  02/05/2019, CONSULTATION DATE:  02/05/2019 REFERRING MD:  Criss Alvine MD, CHIEF COMPLAINT:  AMS   Brief History   63 yr old F PMHx HTN, NIDDM, Hypothyroidism, and chronic ETOH use presents after multiple falls and AMS. 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 Pt was LSN at 1530. Members of the group home left to drive. When they got back they found her altered and stumbled, multiple witnessed falls. Pt has lumps and bruising to forehead.Initial CBG 580. Pt has hx of diabetes and ETOH. Pt has left sided gaze. C collar in place. She has movement to both legs. No effort against gravity to right arm, non verbal, cannot follow commands.  Per RN: Patient was evaluated by Neurology and despite loading with AED and Ativan the deviation of gaze has been corrected and she has more obilit and power in her right hand but her mental status has not improved. She remains non verbal.  Last drink was 24 hrs ago per RN caring for the patient  Per patient's daughter: Last time daughter spoke with her was three days ago( 4/23) she was sober at that time to the best of her knowledge.  She has been battling alcoholism for quite some time, has periods of sobriety and then relapses with binges. Per the daughter she tries.  Her Last time in rehab was in 2016 in [place].  Jan 2020 she was admitted to [facility] for a similar episode.   She also has a H/o Cdiff as of Sept 2019.  She states that her Dad Martini Brassell [phone] is legally married to the patient and they were estranged but he recently moved back in.  Per husband says he hasn't seen her drink but she has a history of hiding  it.  Day before yesterday she felt like she was "ouitside of herself" tired and unable to pick things up on the first try. She felt better on 4/25. This morning 4/26 she went to work not very talkative no fevers no nausea and or vomiting to his knowledge.  Past Medical History  .Marland Kitchen Active Ambulatory Problems    Diagnosis Date Noted  . Hyperglycemia 01/22/2018  . Essential hypertension 01/22/2018  . Alcohol withdrawal syndrome with complication, with unspecified complication (HCC) 01/22/2018  . Elevated LFTs 01/22/2018  . Lactic acidosis 01/22/2018  . Hypothyroidism 01/22/2018  . Thrombocytopenia (HCC) 01/22/2018  . Hypomagnesemia 01/22/2018  . Acute renal injury (HCC) 01/22/2018  . Syncope and collapse 07/03/2018  . C. difficile colitis 07/05/2018   Resolved Ambulatory Problems    Diagnosis Date Noted  . No Resolved Ambulatory Problems   Past Medical History:  Diagnosis Date  . Diabetes mellitus without complication (HCC)   . Hypertension   . Hypothyroid      Significant Hospital Events   Seizure activity, right hemiparesis with deviated gaze requiring Neurology consult  Consults:  PCCM>>>>>>>>02/05/2019 NEURO>>>>>>>>>02/05/2019  Procedures:  CVC insertion 02/05/2019- confirmed w/ CXR  Significant Diagnostic Tests:  Admission labs: Na 134 K 2.8  Cl- 97 HCO3- 19 Glucose 511 AG 18 Albumin 4.3 Calcium 10.1 Mg 1.4 GFR 58 Trop <0.03 CK  63 Hgb 11.1 Hct 32.9 Plts 102 PT 13.4 INR 1 ETOH <10  Imaging: CT-scan of the brain-aspects 10.  No bleed CTA head and neck-no acute changes no LVO. MRI of the brain- could not stay still, only diffusion images obtained, motion degraded but no obvious large vessel large territory MCA stroke EEG:   Micro Data:  H/o pan sens Ecoli in urine 07/03/2018 Has a h/o C.diff Sept 2019>>>> no diarrhea today No recent data on this admission  Antimicrobials:  No signs of an active infection.    Interim history/subjective:  On my evaluation at 9  pm pt is altered, non verbal protecting her airway no deviation in gaze, withdraws from pain and moves all extremities.  SBP 160 HR 120   Objective   Blood pressure (!) 163/88, pulse (!) 120, temperature 98.1 F (36.7 C), resp. rate 19, weight 57.1 kg, SpO2 95 %.       No intake or output data in the 24 hours ending 02/05/19 2112 Filed Weights   02/05/19 1800  Weight: 57.1 kg    Examination: General: thin awake but altered feale HENT: 3 bruises noted along forehead. No deviation in gaze Lungs: clear to auscultation bilaterally Cardiovascular: S1 and S2 w/ increased rate  Abdomen: soft not distended non tender + BS Extremities: thin no edema noted in lower ext Skin: lacy pattern over lower and upper extremities Neuro: altered awake, moving all extremities, periodically snoring but awakens to verbal stimuli. RUE 3/5 LUE 4/5 unable to assess strength of lower ext. Pt contracted. GU: indwelling foley catheter    Assessment & Plan:  1. Acute metabolic encephalopathy w/ seizure activity leftward gaze on exam and hemiparesis on the right Pt was loaded with Keppra 1 g IV x1 and with fosphenytoin 1000 mg IV x1 now H/o ETOH use and in DKA- metabolic derangements may be responsible for altered sensorium Plan: Imaging negative Continue on neuro-checks w/ seizure precautions Appreciate Neuro's recommendations Continue Keppra 500 twice daily Continue Dilantin 100 3 times daily Correct metabolic derangement Check TSH, Ammonia and B12  2. Diabetic Ketoacidosis: AG 18 BG 511 ETOH induced? Prior to my evaluation not on insulin gtt Only received 1L fluid bolus Plan: Hold metformin while in-patient Placed a RIJ CVC Started on insulin gtt w/ gluco-stabilizer BMP/ VBG Q4 hrs Check HgbA1c When BG <250 will switch fluids to D51/2 NS When AG closes will bridge to [facility] w/ long acting Aggressive electrolyte replacement including potassium  3. Thrombocytopenia Chronic Alcoholic Decreased  production Plan: F/u smear Monitor daily  4. ETOH withdrawal: Has a h/o binge drinking per daughter ETOH level <10 Per daughter pt sounded sober on 02/08/2019 HTN and Tachycardic  Plan: CIWA protocol Starting on Clonidine 0.1 mg PO via NGT Replaced thiamine and folic acid  5. H/o Hypothyroidism rx synthroid, compliance unknown Plan: Check TSH  6. HTN Urgency: Not on any home medications BP max on this admission: 208/155mmhg EF 55-65% mild LVH no systolic dysfunction Plan: Clonidine 0.1mg  w/ holding paramaters Goal SBP 160 for the first few hrs then <140/80   Best practice:  Diet: NPO Pain/Anxiety/Delirium protocol (if indicated): will continue to monitor VAP protocol (if indicated): not intubated DVT prophylaxis: Lovenox and SCDs GI prophylaxis: protonix Glucose control: insulin gtt Mobility: Bedrest Code Status: Full Family Communication: Daughter and huisband are aware of patient's clinical condition Disposition: ICU   Labs   CBC: Recent Labs  Lab 02/05/19 1842 02/05/19 2026  WBC 4.3  --   NEUTROABS 2.8  --  HGB 11.1* 11.9*  HCT 32.9* 35.0*  MCV 92.4  --   PLT 102*  --     Basic Metabolic Panel: Recent Labs  Lab 02/05/19 1842 02/05/19 1847 02/05/19 2026  NA 134*  --  135  K 2.8*  --  2.8*  CL 97*  --   --   CO2 19*  --   --   GLUCOSE 511*  --   --   BUN 15  --   --   CREATININE 1.16* 0.80  --   CALCIUM 10.1  --   --   MG 1.4*  --   --    GFR: CrCl cannot be calculated (Unknown ideal weight.). Recent Labs  Lab 02/05/19 1842  WBC 4.3    Liver Function Tests: Recent Labs  Lab 02/05/19 1842  AST 33  ALT 28  ALKPHOS 67  BILITOT 0.8  PROT 7.2  ALBUMIN 4.3   No results for input(s): LIPASE, AMYLASE in the last 168 hours. No results for input(s): AMMONIA in the last 168 hours.  ABG    Component Value Date/Time   HCO3 23.6 02/05/2019 2026   TCO2 25 02/05/2019 2026   ACIDBASEDEF 1.0 02/05/2019 2026   O2SAT 46.0 02/05/2019  2026     Coagulation Profile: Recent Labs  Lab 02/05/19 1842  INR 1.0    Cardiac Enzymes: Recent Labs  Lab 02/05/19 1842  TROPONINI <0.03    HbA1C: Hgb A1c MFr Bld  Date/Time Value Ref Range Status  07/04/2018 03:30 PM 8.6 (H) 4.8 - 5.6 % Final    Comment:    (NOTE)         Prediabetes: 5.7 - 6.4         Diabetes: >6.4         Glycemic control for adults with diabetes: <7.0   01/23/2018 12:53 AM 8.1 (H) 4.8 - 5.6 % Final    Comment:    (NOTE) Pre diabetes:          5.7%-6.4% Diabetes:              >6.4% Glycemic control for   <7.0% adults with diabetes     CBG: Recent Labs  Lab 02/05/19 1843  GLUCAP 470*    Review of Systems:   Marland KitchenMarland KitchenReview of Systems  Unable to perform ROS: Mental status change     Past Medical History  She,  has a past medical history of Diabetes mellitus without complication (HCC), Hypertension, and Hypothyroid.   Surgical History    Past Surgical History:  Procedure Laterality Date  . denies       Social History   reports that she has never smoked. She has never used smokeless tobacco. She reports current alcohol use. She reports that she does not use drugs.   Family History   Her family history includes Hypertension in her mother.   Allergies No Known Allergies   Home Medications  Prior to Admission medications   Medication Sig Start Date End Date Taking? Authorizing Provider  levothyroxine (SYNTHROID, LEVOTHROID) 100 MCG tablet Take 1 tablet (100 mcg total) by mouth daily before breakfast. 07/06/18 11/03/18  Enid Baas, MD  lovastatin (MEVACOR) 20 MG tablet Take 40 mg by mouth at bedtime. 06/21/18   [provider]  metFORMIN (GLUCOPHAGE) 1000 MG tablet Take 1 tablet (1,000 mg total) by mouth 2 (two) times daily with a meal. 05/01/18   Derwood Kaplan, MD  metoprolol tartrate (LOPRESSOR) 50 MG tablet Take 1 tablet (50  mg total) by mouth 2 (two) times daily. 07/06/18   Enid Baas, MD  ondansetron (ZOFRAN  ODT) 4 MG disintegrating tablet Take 1 tablet (4 mg total) by mouth every 8 (eight) hours as needed for nausea or vomiting. 05/01/18   Derwood Kaplan, MD  potassium chloride SA (K-DUR,KLOR-CON) 20 MEQ tablet Take 1 tablet (20 mEq total) by mouth daily. 07/06/18   Enid Baas, MD  sertraline (ZOLOFT) 25 MG tablet Take 1 tablet (25 mg total) by mouth daily. 05/01/18 05/01/19  Derwood Kaplan, MD    STAFF NOTE  I, Dr Newell Coral have personally reviewed patient's available data, including medical history, events of note, physical examination and test results as part of my evaluation. I have discussed with other care providers such as pharmacist, RN and Elink.  In addition,  I personally evaluated patient  The patient is critically ill with multiple organ systems failure and requires high complexity decision making for assessment and support, frequent evaluation and titration of therapies, application of advanced monitoring technologies and extensive interpretation of multiple databases.   Critical Care Time devoted to patient care services described in this note is  65 Minutes. This time reflects time of care of this signee Dr Newell Coral. This critical care time does not reflect procedure time, or teaching time or supervisory time of NP but could involve care discussion time   CC TIME:65  minutes CODE STATUS:Full DISPOSITION: ICU PROGNOSIS: Guarded  Dr. Newell Coral Pulmonary Critical Care Medicine  02/05/2019 10:42 PM    Critical care time: 65 mins

## 2019-02-05 NOTE — ED Triage Notes (Signed)
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 Pt was LSN at 1530. Members of the group home left to drive. When they got back they found her altered and stumbled, multiple witnessed falls. Pt has lumps and bruising to forehead. 18G PIV to LAC and RAC placed by EMs. Initial CBG 580. Pt has hx of diabetes and ETOH. Pt has left sided gaze. C collar in place. She has movement to both legs. No effort against gravity to right arm, non verbal, cannot follow commands.

## 2019-02-06 ENCOUNTER — Inpatient Hospital Stay (HOSPITAL_COMMUNITY): Payer: Self-pay

## 2019-02-06 DIAGNOSIS — G40901 Epilepsy, unspecified, not intractable, with status epilepticus: Principal | ICD-10-CM

## 2019-02-06 DIAGNOSIS — R4182 Altered mental status, unspecified: Secondary | ICD-10-CM

## 2019-02-06 LAB — POCT I-STAT EG7
Acid-base deficit: 2 mmol/L (ref 0.0–2.0)
Bicarbonate: 22.1 mmol/L (ref 20.0–28.0)
Calcium, Ion: 1.24 mmol/L (ref 1.15–1.40)
HCT: 33 % — ABNORMAL LOW (ref 36.0–46.0)
Hemoglobin: 11.2 g/dL — ABNORMAL LOW (ref 12.0–15.0)
O2 Saturation: 74 %
Patient temperature: 98.2
Potassium: 3.9 mmol/L (ref 3.5–5.1)
Sodium: 137 mmol/L (ref 135–145)
TCO2: 23 mmol/L (ref 22–32)
pCO2, Ven: 36.2 mmHg — ABNORMAL LOW (ref 44.0–60.0)
pH, Ven: 7.393 (ref 7.250–7.430)
pO2, Ven: 39 mmHg (ref 32.0–45.0)

## 2019-02-06 LAB — GLUCOSE, CAPILLARY
Glucose-Capillary: 127 mg/dL — ABNORMAL HIGH (ref 70–99)
Glucose-Capillary: 128 mg/dL — ABNORMAL HIGH (ref 70–99)
Glucose-Capillary: 129 mg/dL — ABNORMAL HIGH (ref 70–99)
Glucose-Capillary: 131 mg/dL — ABNORMAL HIGH (ref 70–99)
Glucose-Capillary: 137 mg/dL — ABNORMAL HIGH (ref 70–99)
Glucose-Capillary: 153 mg/dL — ABNORMAL HIGH (ref 70–99)
Glucose-Capillary: 163 mg/dL — ABNORMAL HIGH (ref 70–99)
Glucose-Capillary: 166 mg/dL — ABNORMAL HIGH (ref 70–99)
Glucose-Capillary: 237 mg/dL — ABNORMAL HIGH (ref 70–99)
Glucose-Capillary: 264 mg/dL — ABNORMAL HIGH (ref 70–99)
Glucose-Capillary: 70 mg/dL (ref 70–99)

## 2019-02-06 LAB — BASIC METABOLIC PANEL
Anion gap: 10 (ref 5–15)
Anion gap: 11 (ref 5–15)
Anion gap: 8 (ref 5–15)
BUN: 10 mg/dL (ref 8–23)
BUN: 10 mg/dL (ref 8–23)
BUN: 9 mg/dL (ref 8–23)
CO2: 21 mmol/L — ABNORMAL LOW (ref 22–32)
CO2: 24 mmol/L (ref 22–32)
CO2: 24 mmol/L (ref 22–32)
Calcium: 8.8 mg/dL — ABNORMAL LOW (ref 8.9–10.3)
Calcium: 9.1 mg/dL (ref 8.9–10.3)
Calcium: 9.4 mg/dL (ref 8.9–10.3)
Chloride: 100 mmol/L (ref 98–111)
Chloride: 103 mmol/L (ref 98–111)
Chloride: 105 mmol/L (ref 98–111)
Creatinine, Ser: 0.87 mg/dL (ref 0.44–1.00)
Creatinine, Ser: 0.95 mg/dL (ref 0.44–1.00)
Creatinine, Ser: 1.02 mg/dL — ABNORMAL HIGH (ref 0.44–1.00)
GFR calc Af Amer: >60 mL/min (ref >60)
GFR calc Af Amer: >60 mL/min (ref >60)
GFR calc Af Amer: >60 mL/min (ref >60)
GFR calc non Af Amer: 58 mL/min — ABNORMAL LOW (ref >60)
GFR calc non Af Amer: >60 mL/min (ref >60)
GFR calc non Af Amer: >60 mL/min (ref >60)
Glucose, Bld: 137 mg/dL — ABNORMAL HIGH (ref 70–99)
Glucose, Bld: 163 mg/dL — ABNORMAL HIGH (ref 70–99)
Glucose, Bld: 315 mg/dL — ABNORMAL HIGH (ref 70–99)
Potassium: 3.3 mmol/L — ABNORMAL LOW (ref 3.5–5.1)
Potassium: 3.5 mmol/L (ref 3.5–5.1)
Potassium: 4 mmol/L (ref 3.5–5.1)
Sodium: 132 mmol/L — ABNORMAL LOW (ref 135–145)
Sodium: 137 mmol/L (ref 135–145)
Sodium: 137 mmol/L (ref 135–145)

## 2019-02-06 LAB — CBC
HCT: 32.9 % — ABNORMAL LOW (ref 36.0–46.0)
Hemoglobin: 11.2 g/dL — ABNORMAL LOW (ref 12.0–15.0)
MCH: 31.4 pg (ref 26.0–34.0)
MCHC: 34 g/dL (ref 30.0–36.0)
MCV: 92.2 fL (ref 80.0–100.0)
Platelets: 102 10*3/uL — ABNORMAL LOW (ref 150–400)
RBC: 3.57 MIL/uL — ABNORMAL LOW (ref 3.87–5.11)
RDW: 13.2 % (ref 11.5–15.5)
WBC: 7.7 10*3/uL (ref 4.0–10.5)
nRBC: 0 % (ref 0.0–0.2)

## 2019-02-06 LAB — TECHNOLOGIST SMEAR REVIEW

## 2019-02-06 LAB — MRSA PCR SCREENING: MRSA by PCR: NEGATIVE

## 2019-02-06 MED ORDER — THIAMINE HCL 100 MG/ML IJ SOLN
250.0000 mg | Freq: Every day | INTRAVENOUS | Status: DC
  Filled 2019-02-06 (×2): qty 2.5

## 2019-02-06 MED ORDER — THIAMINE HCL 100 MG/ML IJ SOLN
500.0000 mg | Freq: Once | INTRAVENOUS | Status: AC
  Administered 2019-02-06: 06:00:00 500 mg via INTRAVENOUS
  Filled 2019-02-06: qty 5

## 2019-02-06 MED ORDER — FOLIC ACID 5 MG/ML IJ SOLN
1.0000 mg | Freq: Every day | INTRAMUSCULAR | Status: AC
  Administered 2019-02-06: 1 mg via INTRAVENOUS
  Filled 2019-02-06: qty 0.2

## 2019-02-06 MED ORDER — THIAMINE HCL 100 MG/ML IJ SOLN
Freq: Once | INTRAVENOUS | Status: AC
  Administered 2019-02-06: 12:00:00 via INTRAVENOUS
  Filled 2019-02-06: qty 1000

## 2019-02-06 MED ORDER — INSULIN GLARGINE 100 UNIT/ML ~~LOC~~ SOLN
15.0000 [IU] | Freq: Every day | SUBCUTANEOUS | Status: DC
  Administered 2019-02-06 – 2019-02-07 (×2): 15 [IU] via SUBCUTANEOUS
  Filled 2019-02-06 (×4): qty 0.15

## 2019-02-06 MED ORDER — GLUCERNA 1.2 CAL PO LIQD
1000.0000 mL | ORAL | Status: DC
  Administered 2019-02-06: 1000 mL
  Filled 2019-02-06 (×3): qty 1000

## 2019-02-06 MED ORDER — LEVOTHYROXINE SODIUM 100 MCG PO TABS
100.0000 ug | ORAL_TABLET | Freq: Every day | ORAL | Status: DC
  Administered 2019-02-06 – 2019-02-08 (×3): 100 ug via ORAL
  Filled 2019-02-06 (×4): qty 1

## 2019-02-06 MED ORDER — LEVETIRACETAM 100 MG/ML PO SOLN
500.0000 mg | Freq: Two times a day (BID) | ORAL | Status: DC
  Administered 2019-02-06 – 2019-02-07 (×2): 500 mg
  Filled 2019-02-06 (×2): qty 5

## 2019-02-06 MED ORDER — INSULIN ASPART 100 UNIT/ML ~~LOC~~ SOLN
2.0000 [IU] | SUBCUTANEOUS | Status: DC
  Administered 2019-02-06: 4 [IU] via SUBCUTANEOUS
  Administered 2019-02-06 (×2): 2 [IU] via SUBCUTANEOUS
  Administered 2019-02-06: 4 [IU] via SUBCUTANEOUS
  Administered 2019-02-06: 2 [IU] via SUBCUTANEOUS
  Administered 2019-02-07: 4 [IU] via SUBCUTANEOUS
  Administered 2019-02-07: 2 [IU] via SUBCUTANEOUS
  Administered 2019-02-07: 6 [IU] via SUBCUTANEOUS
  Administered 2019-02-08: 2 [IU] via SUBCUTANEOUS

## 2019-02-06 MED ORDER — THIAMINE HCL 100 MG/ML IJ SOLN
500.0000 mg | Freq: Three times a day (TID) | INTRAVENOUS | Status: AC
  Administered 2019-02-06 – 2019-02-07 (×5): 500 mg via INTRAVENOUS
  Filled 2019-02-06 (×6): qty 5

## 2019-02-06 MED ORDER — LEVETIRACETAM 500 MG PO TABS
500.0000 mg | ORAL_TABLET | Freq: Two times a day (BID) | ORAL | Status: DC
  Administered 2019-02-06: 500 mg via ORAL
  Filled 2019-02-06 (×2): qty 1

## 2019-02-06 MED ORDER — PHENYTOIN 50 MG PO CHEW
100.0000 mg | CHEWABLE_TABLET | Freq: Three times a day (TID) | ORAL | Status: DC
  Filled 2019-02-06 (×2): qty 2

## 2019-02-06 NOTE — Progress Notes (Signed)
Initial Nutrition Assessment  DOCUMENTATION CODES:   Not applicable  INTERVENTION:   Recommend begin TF via NGT:  Glucerna 1.2 at 55 ml/h (1320 ml per day)  Provides 1584 kcal, 79 gm protein, 1063 ml free water daily  NUTRITION DIAGNOSIS:   Inadequate oral intake related to lethargy/confusion as evidenced by NPO status.  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 After admission, patient had seizure activity.   Patient is currently NPO due to acute encephalopathy. NGT in place.  Patient is at increased nutrition risk due to hx of alcoholism and current AMS with NPO status.   Labs reviewed. CBG's: [id number]  Medications reviewed and include folic acid, novolog, lantus, keppra, thiamine.    NUTRITION - FOCUSED PHYSICAL EXAM:  unable to complete at this time  Diet Order:   Diet Order            Diet NPO time specified  Diet effective now              EDUCATION NEEDS:   No education needs have been identified at this time  Skin:  Skin Assessment: Reviewed RN Assessment  Last BM:  no BM documented  Height:   Ht Readings from Last 1 Encounters:  02/06/19 5\' 2"  (1.575 m)    Weight:   Wt Readings from Last 1 Encounters:  02/06/19 55.7 kg    Ideal Body Weight:  50 kg  BMI:  Body mass index is 22.46 kg/m.  Estimated Nutritional Needs:   Kcal:  1550-1750  Protein:  75-85 gm  Fluid:  >/= 1.6 L    Joaquin Courts, RD, LDN, CNSC Pager [phone] After Hours Pager [phone]

## 2019-02-06 NOTE — Progress Notes (Signed)
EEG Complete  Results Pending 

## 2019-02-06 NOTE — Progress Notes (Signed)
Inpatient Diabetes Program Recommendations  AACE/ADA: New Consensus Statement on Inpatient Glycemic Control (2015)  Target Ranges:  Prepandial:   less than 140 mg/dL      Peak postprandial:   less than 180 mg/dL (1-2 hours)      Critically ill patients:  140 - 180 mg/dL   Results for SHELY, WINTHROP (MRN [id number]) as of 02/06/2019 07:28  Ref. Range 02/05/2019 23:15 02/06/2019 00:14 02/06/2019 01:07 02/06/2019 02:01 02/06/2019 02:59 02/06/2019 03:57 02/06/2019 06:16  Glucose-Capillary Latest Ref Range: 70 - 99 mg/dL 601 (H)  IV Insulin Drip  264 (H)  IV Insulin Drip  237 (H)  IV Insulin Drip  153 (H)  IV Insulin Drip   15 units LANTUS given at 2:33am 137 (H)  IV Insulin Drip  128 (H)  2 units NOVOLOG  129 (H)   Results for SHARNIECE, BELLINGER (MRN [id number]) as of 02/06/2019 07:28  Ref. Range 07/04/2018 15:30 02/05/2019 23:33  Hemoglobin A1C Latest Ref Range: 4.8 - 5.6 % 8.6 (H)  (200 mg/dl) 22.0 (H)  (254 mg/dl)    Admit with: Multiple falls/ AMS/ Seizures/ DKA  History: DM, Chronic ETOH  Group Home DM Meds: Metformin 1000 mg BID  Current Orders: Lantus 15 units Daily      Novolog 2-4-6 units Q4 hours      Admission BMET showed: Anion Gap 18/ CO2 of 19/ Glucose 511 mg/dl.  BMET at 3am: Anion Gap 10 and CO2 up to 24.  Per Nursing Assessment this AM, remains disoriented.  Transitioning off the IV Insulin Drip to Lantus and Novolog this AM.  1st dose Lantus given at 2:33am.    MD- If patient has issues with ETOH Abuse, Metformin likely not the best option for patient at home for glucose control.  A1c elevated to 11.1%.  May need insulin for home?  Not sure if patient has the ability to give herself insulin??  Will need to follow closely for discharge diabetes medication needs.     --Will follow patient during hospitalization--  Ambrose Finland RN, MSN, CDE Diabetes Coordinator Inpatient Glycemic Control Team Team Pager: [phone] (8a-5p)

## 2019-02-06 NOTE — H&P (Addendum)
..   NAME:  Shannon Herrera, MRN:  [id number], DOB:  21-Nov-1955, LOS: 1 ADMISSION DATE:  02/05/2019, CONSULTATION DATE:  02/05/2019 REFERRING MD:  Criss Alvine MD, CHIEF COMPLAINT:  AMS   Brief History   63 yr old F PMHx HTN, NIDDM, Hypothyroidism, and chronic ETOH use presents after multiple falls and AMS. 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 Known relapsing and remitting alcoholism, will hide her drinking. Unclear if she had been drinking recently, but Ethoh level negative in the ED.  Past Medical History  .Marland Kitchen Active Ambulatory Problems    Diagnosis Date Noted  . Hyperglycemia 01/22/2018  . Essential hypertension 01/22/2018  . Alcohol withdrawal syndrome with complication, with unspecified complication (HCC) 01/22/2018  . Elevated LFTs 01/22/2018  . Lactic acidosis 01/22/2018  . Hypothyroidism 01/22/2018  . Thrombocytopenia (HCC) 01/22/2018  . Hypomagnesemia 01/22/2018  . Acute renal injury (HCC) 01/22/2018  . Syncope and collapse 07/03/2018  . C. difficile colitis 07/05/2018  . Diabetic acidosis without coma (HCC)   . H/O ETOH abuse    Resolved Ambulatory Problems    Diagnosis Date Noted  . No Resolved Ambulatory Problems   Past Medical History:  Diagnosis Date  . Diabetes mellitus without complication (HCC)   . Hypertension   . Hypothyroid     Significant Hospital Events   Seizure activity, right hemiparesis with deviated gaze requiring Neurology consult  Consults:  PCCM>>>>>>>>02/05/2019 NEURO>>>>>>>>>02/05/2019  Procedures:  CVC insertion 02/05/2019- confirmed w/ CXR  Significant Diagnostic Tests:  Admission labs: Na 134 K 2.8  Cl- 97 HCO3- 19 Glucose 511 AG 18 Albumin 4.3 Calcium 10.1 Mg 1.4 GFR 58 Trop <0.03 CK 63 Hgb 11.1 Hct 32.9 Plts 102 PT 13.4 INR 1 ETOH <10  Imaging: CT-scan of the brain-aspects 10.  No bleed CTA head and neck-no acute changes no LVO. MRI of the brain- could not stay  still, only diffusion images obtained, motion degraded but no obvious large vessel large territory MCA stroke EEG: read pending.  Micro Data:  H/o pan sens Ecoli in urine 07/03/2018 Has a h/o C.diff Sept 2019>>>> no diarrhea today No recent data on this admission  Antimicrobials:  No signs of an active infection.    Interim history/subjective:  Remains altered today although starting to follow commands per RN. No apparent focality.   Objective   Blood pressure 101/69, pulse 80, temperature (!) 97.4 F (36.3 C), temperature source Axillary, resp. rate 14, height 5\' 2"  (1.575 m), weight 55.7 kg, SpO2 100 %.        Intake/Output Summary (Last 24 hours) at 02/06/2019 1656 Last data filed at 02/06/2019 1500 Gross per 24 hour  Intake 2260.82 ml  Output 2075 ml  Net 185.82 ml   Filed Weights   02/05/19 1800 02/06/19 0423  Weight: 57.1 kg 55.7 kg    Examination: General: Cachectic, stuporous. HENT: 3 bruises noted along forehead. No deviation in gaze Lungs: clear to auscultation bilaterally Cardiovascular: S1 and S2 w/ increased rate  Abdomen: soft not distended non tender + BS Extremities: thin no edema noted in lower ext Skin: lacy pattern over lower and upper extremities Neuro: moves all extremities but not to command. Roving EOM's. GU: indwelling foley catheter    Assessment & Plan:   Acute metabolic encephalopathy w/ seizure activity and negative imaging. Low suspicion of infection.Wernicke's encephalopathy possible especially given malnutrition. Continue on neuro-checks w/ seizure precautions for 24h.  Continue Keppra 500 twice  daily Stop Dilantin 100 3 times daily as use of multiple AED's may cloud examination. Metabolic derangements have been corrected Treat with high dose thiamine.   Diabetic Ketoacidosis now resolved. Basal bolus insulin  Thrombocytopenia related to alcohol related marrow suppression. Monitor daily  At risk for ETOH withdrawal given unclear  history of recent Ethoh use. CIWA protocol Started on Clonidine 0.1 mg PO via NGT Replaced thiamine and folic acid  5. H/o Hypothyroidism - TSH normal  Resume home dose.  6. HTN Urgency - now resolved on clonidine.   Best practice:  Diet: NPO - will insert Cortrak and feed given severe PEM. Pain/Anxiety/Delirium protocol (if indicated): CIWA monitoring only. VAP protocol (if indicated): not intubated DVT prophylaxis: Lovenox and SCDs GI prophylaxis: protonix Glucose control: basal - SSI Mobility: Bedrest with seizure precaution. Restraints to prevent line dislodgement. Code Status: Full Family Communication: Daughter and huisband are aware of patient's clinical condition Disposition: ICU   Labs   CBC: Recent Labs  Lab 02/05/19 1842 02/05/19 2026 02/05/19 2333 02/06/19 0041  WBC 4.3  --  7.7  --   NEUTROABS 2.8  --   --   --   HGB 11.1* 11.9* 11.2* 11.2*  HCT 32.9* 35.0* 32.9* 33.0*  MCV 92.4  --  92.2  --   PLT 102*  --  102*  --     Basic Metabolic Panel: Recent Labs  Lab 02/05/19 1842 02/05/19 1847 02/05/19 2026 02/05/19 2335 02/06/19 0041 02/06/19 0253 02/06/19 0757  NA 134*  --  135 132* 137 137 137  K 2.8*  --  2.8* 3.3* 3.9 3.5 4.0  CL 97*  --   --  100  --  103 105  CO2 19*  --   --  21*  --  24 24  GLUCOSE 511*  --   --  315*  --  137* 163*  BUN 15  --   --  10  --  9 10  CREATININE 1.16* 0.80  --  1.02*  --  0.87 0.95  CALCIUM 10.1  --   --  9.4  --  9.1 8.8*  MG 1.4*  --   --   --   --   --   --    GFR: Estimated Creatinine Clearance: 47.3 mL/min (by C-G formula based on SCr of 0.95 mg/dL). Recent Labs  Lab 02/05/19 1842 02/05/19 2333  WBC 4.3 7.7    Liver Function Tests: Recent Labs  Lab 02/05/19 1842  AST 33  ALT 28  ALKPHOS 67  BILITOT 0.8  PROT 7.2  ALBUMIN 4.3   No results for input(s): LIPASE, AMYLASE in the last 168 hours. Recent Labs  Lab 02/05/19 2220  AMMONIA 39*    ABG    Component Value Date/Time   HCO3  22.1 02/06/2019 0041   TCO2 23 02/06/2019 0041   ACIDBASEDEF 2.0 02/06/2019 0041   O2SAT 74.0 02/06/2019 0041     Coagulation Profile: Recent Labs  Lab 02/05/19 1842  INR 1.0    Cardiac Enzymes: Recent Labs  Lab 02/05/19 1842 02/05/19 1846  CKTOTAL  --  63  TROPONINI <0.03  --     HbA1C: Hgb A1c MFr Bld  Date/Time Value Ref Range Status  02/05/2019 11:33 PM 11.1 (H) 4.8 - 5.6 % Final    Comment:    (NOTE) Pre diabetes:          5.7%-6.4% Diabetes:              >  6.4% Glycemic control for   <7.0% adults with diabetes   07/04/2018 03:30 PM 8.6 (H) 4.8 - 5.6 % Final    Comment:    (NOTE)         Prediabetes: 5.7 - 6.4         Diabetes: >6.4         Glycemic control for adults with diabetes: <7.0     CBG: Recent Labs  Lab 02/06/19 0357 02/06/19 0616 02/06/19 0745 02/06/19 1133 02/06/19 1518  GLUCAP 128* 129* 131* 127* 70   Lynnell Catalan, MD [facility] ICU Physician [facility] [place] [facility]  Pager: [phone] Mobile: [phone] After hours: [phone].  02/06/2019, 4:57 PM

## 2019-02-06 NOTE — Progress Notes (Signed)
Reason for consult: Encephalopathy, seizures  Subjective: She remains drowsy, but easily arousable and follows commands.  No further clinical seizure activity noted since yesterday.   ROS: negative except above  Examination  Vital signs in last 24 hours: Temp:  [97.7 F (36.5 C)-100.8 F (38.2 C)] 97.7 F (36.5 C) (04/27 1100) Pulse Rate:  [87-133] 87 (04/27 1200) Resp:  [12-25] 15 (04/27 1200) BP: (109-208)/(60-114) 109/70 (04/27 1200) SpO2:  [95 %-100 %] 99 % (04/27 1200) Weight:  [55.7 kg-57.1 kg] 55.7 kg (04/27 0423)  General: lying in bed CVS: pulse-normal rate and rhythm RS: breathing comfortably Extremities: normal   Neuro: MS: Alert, oriented name follows commands CN: Pupils equal and reactive,  EOMI, face symmetric, tongue midline, normal sensation over face, Motor: 4+/5 strength in all 4 extremities Reflexes: 2+ bilaterally over patella, biceps, plantars: flexor Coordination: normal Gait: not tested  Basic Metabolic Panel: Recent Labs  Lab 02/05/19 1842 02/05/19 1847 02/05/19 2026 02/05/19 2335 02/06/19 0041 02/06/19 0253 02/06/19 0757  NA 134*  --  135 132* 137 137 137  K 2.8*  --  2.8* 3.3* 3.9 3.5 4.0  CL 97*  --   --  100  --  103 105  CO2 19*  --   --  21*  --  24 24  GLUCOSE 511*  --   --  315*  --  137* 163*  BUN 15  --   --  10  --  9 10  CREATININE 1.16* 0.80  --  1.02*  --  0.87 0.95  CALCIUM 10.1  --   --  9.4  --  9.1 8.8*  MG 1.4*  --   --   --   --   --   --     CBC: Recent Labs  Lab 02/05/19 1842 02/05/19 2026 02/05/19 2333 02/06/19 0041  WBC 4.3  --  7.7  --   NEUTROABS 2.8  --   --   --   HGB 11.1* 11.9* 11.2* 11.2*  HCT 32.9* 35.0* 32.9* 33.0*  MCV 92.4  --  92.2  --   PLT 102*  --  102*  --      Coagulation Studies: Recent Labs    02/05/19 1842  LABPROT 13.4  INR 1.0    Imaging Reviewed: MRI brain    ASSESSMENT AND PLAN  63 year old female with past medical history of diabetes, alcohol abuse,  hypertension admitted with diabetic ketoacidosis and status epilepticus.  Presented with right-sided weakness and right visual field deficits - stroke ruled out on MRI. Patient was loaded with fosphenytoin and Keppra.  Status epilepticus: Resolved Diabetic ketoacidosis  Recommendations Routine EEG Continue Keppra, Dilantin Seizure precautions  We will continue to follow.    Georgiana Spinner Aroor [facility] Pager Number [phone] For questions after 7pm please refer to AMION to reach the Neurologist on call

## 2019-02-06 NOTE — Progress Notes (Signed)
eLink Physician-Brief Progress Note Patient Name: Shannon Herrera DOB: [birth year] MRN: [id number]   Date of Service  02/06/2019  HPI/Events of Note  Request for AM lab orders.  eICU Interventions  Will order: 1. CBC with platelets, BMP and Mg++ level in AM.      Intervention Category Major Interventions: Other:  Lenell Antu 02/06/2019, 11:06 PM

## 2019-02-06 NOTE — Progress Notes (Addendum)
PCCM Interval Note   BG 250mg /dl and AG is closed  Notified by RN  Plan: Give [place] Lantus now wait 1 hr then discontinue insulin gtt After discontinuing continuous insulin please stop D 51/2 NS Start on ISS coverage. When pt starts having meals will switch to TID AC  Pt remains NPO has an NGT due to Acute encephalopathy On repeat exam moves all extremities  Not verbal not oriented Continue with Neurochecks  Signed Dr Newell Coral Pulmonary Critical Care Locums

## 2019-02-07 ENCOUNTER — Inpatient Hospital Stay (HOSPITAL_COMMUNITY): Payer: Self-pay

## 2019-02-07 DIAGNOSIS — E0811 Diabetes mellitus due to underlying condition with ketoacidosis with coma: Secondary | ICD-10-CM

## 2019-02-07 LAB — CBC WITH DIFFERENTIAL/PLATELET
Abs Immature Granulocytes: 0.01 10*3/uL (ref 0.00–0.07)
Basophils Absolute: 0 10*3/uL (ref 0.0–0.1)
Basophils Relative: 0 %
Eosinophils Absolute: 0.1 10*3/uL (ref 0.0–0.5)
Eosinophils Relative: 1 %
HCT: 31 % — ABNORMAL LOW (ref 36.0–46.0)
Hemoglobin: 10.2 g/dL — ABNORMAL LOW (ref 12.0–15.0)
Immature Granulocytes: 0 %
Lymphocytes Relative: 33 %
Lymphs Abs: 1.3 10*3/uL (ref 0.7–4.0)
MCH: 31.8 pg (ref 26.0–34.0)
MCHC: 32.9 g/dL (ref 30.0–36.0)
MCV: 96.6 fL (ref 80.0–100.0)
Monocytes Absolute: 0.3 10*3/uL (ref 0.1–1.0)
Monocytes Relative: 8 %
Neutro Abs: 2.2 10*3/uL (ref 1.7–7.7)
Neutrophils Relative %: 58 %
Platelets: 96 10*3/uL — ABNORMAL LOW (ref 150–400)
RBC: 3.21 MIL/uL — ABNORMAL LOW (ref 3.87–5.11)
RDW: 14 % (ref 11.5–15.5)
WBC: 3.8 10*3/uL — ABNORMAL LOW (ref 4.0–10.5)
nRBC: 0 % (ref 0.0–0.2)

## 2019-02-07 LAB — GLUCOSE, CAPILLARY
Glucose-Capillary: 121 mg/dL — ABNORMAL HIGH (ref 70–99)
Glucose-Capillary: 180 mg/dL — ABNORMAL HIGH (ref 70–99)
Glucose-Capillary: 208 mg/dL — ABNORMAL HIGH (ref 70–99)
Glucose-Capillary: 72 mg/dL (ref 70–99)
Glucose-Capillary: 253 mg/dL — ABNORMAL HIGH (ref 70–99)

## 2019-02-07 LAB — BASIC METABOLIC PANEL
Anion gap: 10 (ref 5–15)
BUN: 9 mg/dL (ref 8–23)
CO2: 21 mmol/L — ABNORMAL LOW (ref 22–32)
Calcium: 8.9 mg/dL (ref 8.9–10.3)
Chloride: 108 mmol/L (ref 98–111)
Creatinine, Ser: 0.83 mg/dL (ref 0.44–1.00)
GFR calc Af Amer: >60 mL/min (ref >60)
GFR calc non Af Amer: >60 mL/min (ref >60)
Glucose, Bld: 151 mg/dL — ABNORMAL HIGH (ref 70–99)
Potassium: 3.7 mmol/L (ref 3.5–5.1)
Sodium: 139 mmol/L (ref 135–145)

## 2019-02-07 LAB — MAGNESIUM: Magnesium: 1.6 mg/dL — ABNORMAL LOW (ref 1.7–2.4)

## 2019-02-07 MED ORDER — ADULT MULTIVITAMIN LIQUID CH
15.0000 mL | Freq: Every day | ORAL | Status: DC
  Administered 2019-02-07 – 2019-02-08 (×2): 15 mL
  Filled 2019-02-07 (×2): qty 15

## 2019-02-07 MED ORDER — SERTRALINE HCL 50 MG PO TABS
25.0000 mg | ORAL_TABLET | Freq: Every day | ORAL | Status: DC
  Administered 2019-02-07 – 2019-02-08 (×2): 25 mg via ORAL
  Filled 2019-02-07 (×2): qty 1

## 2019-02-07 MED ORDER — MAGNESIUM SULFATE 2 GM/50ML IV SOLN
2.0000 g | Freq: Once | INTRAVENOUS | Status: AC
  Administered 2019-02-07: 11:00:00 2 g via INTRAVENOUS
  Filled 2019-02-07: qty 50

## 2019-02-07 MED ORDER — LEVETIRACETAM 100 MG/ML PO SOLN
750.0000 mg | Freq: Two times a day (BID) | ORAL | Status: DC
  Administered 2019-02-07 – 2019-02-08 (×2): 750 mg
  Filled 2019-02-07 (×4): qty 7.5

## 2019-02-07 MED ORDER — METOPROLOL TARTRATE 50 MG PO TABS
50.0000 mg | ORAL_TABLET | Freq: Two times a day (BID) | ORAL | Status: DC
  Administered 2019-02-07 – 2019-02-08 (×3): 50 mg via ORAL
  Filled 2019-02-07: qty 1
  Filled 2019-02-07: qty 4
  Filled 2019-02-07: qty 1

## 2019-02-07 NOTE — Progress Notes (Signed)
Notified Vassie Loll, MD via phone call that bladder scan of pt was 355. MD stated that was fine (since pt had foley removed earlier today and pt had not voided per RN). MD stated pt could have sips of clears and to advance diet as tolerated.   Leonia Reeves, RN

## 2019-02-07 NOTE — Procedures (Signed)
  [facility] Bora Broner A. Gerilyn Pilgrim, MD     [email or web address]           HISTORY: The patient is a 63 year old who presents with altered mental status, alcohol withdrawal and seizures.  MEDICATIONS:  Current Facility-Administered Medications:  .  0.9 %  sodium chloride infusion, , Intravenous, PRN, Scatliffe, Gypsy Balsam, MD, Stopped at 02/07/19 1106 .  dextrose 50 % solution 25 mL, 25 mL, Intravenous, PRN, Scatliffe, Kristen D, MD .  enoxaparin (LOVENOX) injection 40 mg, 40 mg, Subcutaneous, Daily, Scatliffe, Kristen D, MD, 40 mg at 02/07/19 0907 .  feeding supplement (GLUCERNA 1.2 CAL) liquid 1,000 mL, 1,000 mL, Per Tube, Continuous, Agarwala, Ravi, MD, Last Rate: 55 mL/hr at 02/07/19 0335 .  folic acid injection 1 mg, 1 mg, Intravenous, Daily, Scatliffe, Kristen D, MD, 1 mg at 02/07/19 0913 .  insulin aspart (novoLOG) injection 2-6 Units, 2-6 Units, Subcutaneous, Q4H, Scatliffe, Gypsy Balsam, MD, 6 Units at 02/07/19 1126 .  insulin glargine (LANTUS) injection 15 Units, 15 Units, Subcutaneous, Daily, Scatliffe, Gypsy Balsam, MD, 15 Units at 02/07/19 0907 .  levETIRAcetam (KEPPRA) 100 MG/ML solution 750 mg, 750 mg, Per Tube, BID, Oretha Milch, MD .  levothyroxine (SYNTHROID) tablet 100 mcg, 100 mcg, Oral, Q0600, Scatliffe, Kristen D, MD, 100 mcg at 02/07/19 0531 .  LORazepam (ATIVAN) injection 1-2 mg, 1-2 mg, Intravenous, Q1H PRN, Scatliffe, Kristen D, MD, 2 mg at 02/05/19 2330 .  metoprolol tartrate (LOPRESSOR) tablet 50 mg, 50 mg, Oral, BID, Cyril Mourning V, MD, 50 mg at 02/07/19 1110 .  multivitamin liquid 15 mL, 15 mL, Per Tube, Daily, Oretha Milch, MD, 15 mL at 02/07/19 1128 .  sertraline (ZOLOFT) tablet 25 mg, 25 mg, Oral, Daily, Cyril Mourning V, MD, 25 mg at 02/07/19 1110 .  sodium chloride flush (NS) 0.9 % injection 3 mL, 3 mL, Intravenous, Once, Pricilla Loveless, MD .  thiamine 500mg  in normal saline (46ml) IVPB, 500 mg, Intravenous, TID, Stopped at 02/07/19 0936 **FOLLOWED BY**  [START ON 02/08/2019] thiamine (B-1) 250 mg in sodium chloride 0.9 % 50 mL IVPB, 250 mg, Intravenous, Daily, Agarwala, Ravi, MD     ANALYSIS: A 16 channel recording using standard 10 20 measurements is conducted for 23 minutes.  There is a well-formed posterior dominant rhythm of 11 Hz which attenuates with eye opening.  There is beta activity observed in frontal areas.  Awake and drowsy activities are observed.  There is some rudimentary K complexes seen from time to time.  There is increased spindling seen throughout the recording.  Photic stimulation and hyperventilation are not conducted.  There is no focal or lateral slowing.  There is no epileptiform activity is observed.   IMPRESSION: 1.  This is a normal recording of awake and drowsy states.      Zaire Levesque A. Gerilyn Pilgrim, M.D.  Diplomate, Biomedical engineer of Psychiatry and Neurology ( Neurology).

## 2019-02-07 NOTE — Progress Notes (Signed)
Transfer from 48M- pt is alert and oriented x3 (not situation).  R & L AC IVs, pt denies any pain at the moment. Low bed and floor mats in place. Pt ambulated from wheelchair to the bed with one assist. Respirations even and unlabored, pt on RA. No distress noted, skin intact. Pt was oriented to the room and staff, bed alarm on, call light within reach.  Leonia Reeves, RN

## 2019-02-07 NOTE — Progress Notes (Addendum)
Inpatient Diabetes Program Recommendations  AACE/ADA: New Consensus Statement on Inpatient Glycemic Control (2015)  Target Ranges:  Prepandial:   less than 140 mg/dL      Peak postprandial:   less than 180 mg/dL (1-2 hours)      Critically ill patients:  140 - 180 mg/dL    Results for Shannon Herrera, Shannon Herrera (MRN [id number]) as of 02/07/2019 10:44  Ref. Range 02/05/2019 18:43 02/05/2019 21:16 02/05/2019 23:15 02/06/2019 00:14 02/06/2019 01:07 02/06/2019 02:01 02/06/2019 02:59 02/06/2019 03:57 02/06/2019 06:16 02/06/2019 07:45 02/06/2019 11:33 02/06/2019 15:18 02/06/2019 19:35  Glucose-Capillary Latest Ref Range: 70 - 99 mg/dL 591 (H) 638 (H)  IV Insulin Drip 310 (H)  IV Insulin Drip 264 (H)  IV Insulin Drip 237 (H)  IV Insulin Drip 153 (H)  IV Insulin Drip +  15 units LANTUS given at 2:30am 137 (H)  IV Insulin Drip 128 (H)  IV Insulin Drip OFF +  2 units NOVOLOG  129 (H) 131 (H)  2 units NOVOLOG  127 (H)  2 units NOVOLOG  70 166 (H)  4 units NOVOLOG    Results for [name], [name] (MRN [id number]) as of 02/07/2019 10:44  Ref. Range 02/06/2019 23:28 02/07/2019 03:17 02/07/2019 07:30  Glucose-Capillary Latest Ref Range: 70 - 99 mg/dL 017 (H)  4 units NOVOLOG  121 (H)  2 units NOVOLOG   180 (H)  4 units NOVOLOG +  15 units LANTUS given at 9am    Results for [name], [name] (MRN [id number]) as of 02/06/2019 07:28  Ref. Range 07/04/2018 15:30 02/05/2019 23:33  Hemoglobin A1C Latest Ref Range: 4.8 - 5.6 % 8.6 (H)  (200 mg/dl) 23.3 (H)  (007 mg/dl)    Admit with: Multiple falls/ AMS/ Seizures/ DKA  History: DM, Chronic ETOH  Group Home DM Meds: Metformin 1000 mg BID  Current Orders: Lantus 15 units Daily                            Novolog 2-4-6 units Q4 hours     Currently NPO.  Getting continuous tube feeds at 55cc/hour.   MD- Please consider the following in-hospital insulin adjustments:  1. Discontinue the ICU Glycemic Control Protocol  2. Change Novolog SSi  to Moderate scale (0-15 units) Q4 hours per the regular Glycemic Control order set  3. Pt's A1c was elevated to 11.1% this admission (signicant increase from Sept 2019).  Not sure if pt was compliant with her Metformin at home??  Given her issues with ETOH abuse, Metformin may not be the safest option for patient for glucose control at home.  Not sure if oral sulfonylureas would be safe either if she continues to abuse ETOH and misses meals.  Currently does NOT have insurance so need to keep cost considerations in mind.  Could try NPH Insulin BID at home if pt continues to require insulin in the hospital.  Plan to place Care Mgmt consult to help pt find PCP for follow up.  Looks like she was set up with the [facility] in [place], [place] back in Sept 2019 but I can't see where she ever followed up with them.  Will have nurses begin insulin teaching just in case we need to go this route at time of d/c.     --Will follow patient during hospitalization--  Ambrose Finland RN, MSN, CDE Diabetes Coordinator Inpatient Glycemic Control Team Team Pager: [phone] (8a-5p)

## 2019-02-07 NOTE — TOC Initial Note (Signed)
Transition of Care [facility]) - Initial/Assessment Note    Patient Details  Name: Shannon Herrera MRN: [id number] Date of Birth: 1956-09-08  [facility] of [facility]) CM/SW Contact:    Cherylann Parr, RN Phone Number: 02/07/2019, 1:03 PM  Clinical Narrative:     Per pt; pt independent from home with family - pt would not elaborate.  Pt informed CM that she pays out of pocket for prescriptions.  Pt informed CM that she is independent with  mobility and ADLs.  Pt in agreement with CM setting up a virtual appt with [facility] - appt documented on AVS.  HH recommended - need order.  CM informed pt that she can utilize [facility] pharmacy at discharge for medication assistance - pt may require MATCH at discharge.  CM will continue to follow for discharge needs           Expected Discharge Plan: Home/Self Care     Patient Goals and CMS Choice        Expected Discharge Plan and Services Expected Discharge Plan: Home/Self Care       Living arrangements for the past 2 months: Single Family Home                                      Prior Living Arrangements/Services Living arrangements for the past 2 months: Single Family Home Lives with:: Relatives Patient language and need for interpreter reviewed:: Yes Do you feel safe going back to the place where you live?: Yes               Activities of Daily Living      Permission Sought/Granted                  Emotional Assessment   Attitude/Demeanor/Rapport: Avoidant Affect (typically observed): Flat, In denial Orientation: : Oriented to Self, Oriented to Place   Psych Involvement: No (comment)  Admission diagnosis:  Hypokalemia [E87.6] Hypomagnesemia [E83.42] Diabetic ketoacidosis without coma associated with type 2 diabetes mellitus (HCC) [E11.10] Acute metabolic encephalopathy [G93.41] Patient Active Problem List   Diagnosis Date Noted  . Acute metabolic encephalopathy 02/05/2019  . Diabetic acidosis without coma  (HCC)   . H/O ETOH abuse   . C. difficile colitis 07/05/2018  . Syncope and collapse 07/03/2018  . Hyperglycemia 01/22/2018  . Essential hypertension 01/22/2018  . Alcohol withdrawal syndrome with complication, with unspecified complication (HCC) 01/22/2018  . Elevated LFTs 01/22/2018  . Lactic acidosis 01/22/2018  . Hypothyroidism 01/22/2018  . Thrombocytopenia (HCC) 01/22/2018  . Hypomagnesemia 01/22/2018  . Acute renal injury (HCC) 01/22/2018   PCP:  Patient, No Pcp Per Pharmacy:   [facility] [address], [place] - [address]  [place] [address] Phone: [phone] Fax: [phone]     Social Determinants of Health (SDOH) Interventions    Readmission Risk Interventions No flowsheet data found.

## 2019-02-07 NOTE — Evaluation (Signed)
Physical Therapy Evaluation Patient Details Name: Shannon Herrera MRN: [id number] DOB: 1956-03-30 Today's Date: 02/07/2019   History of Present Illness  63 yr old F PMHx HTN, NIDDM, Hypothyroidism, and chronic ETOH use presents after multiple falls and AMS. Pt with metabolic encephalopathy with seizure with negative imaging  Clinical Impression  Pt pleasant in chair on arrival with general lethargy and difficulty maintaining eyes open at rest. Pt with cognitive deficits for orientation, safety, memory and problem solving along with impaired transfers, gait and balance who will benefit from acute therapy to maximize mobility, safety and function. Will need to further assess available assist and home setup as pt providing mixed reports.   HR 89 SPO2 99% on RA    Follow Up Recommendations Home health PT;Supervision/Assistance - 24 hour    Equipment Recommendations  Rolling walker with 5" wheels    Recommendations for Other Services OT consult     Precautions / Restrictions Precautions Precautions: Fall Restrictions Weight Bearing Restrictions: No      Mobility  Bed Mobility               General bed mobility comments: in chair on arrival  Transfers Overall transfer level: Needs assistance   Transfers: Sit to/from Stand Sit to Stand: Min guard         General transfer comment: guarding for safety and lines with cues for hand placement and sequence  Ambulation/Gait Ambulation/Gait assistance: Min assist Gait Distance (Feet): 150 Feet Assistive device: 1 person hand held assist;None Gait Pattern/deviations: Step-through pattern;Decreased stride length;Drifts right/left;Trunk flexed   Gait velocity interpretation: 1.31 - 2.62 ft/sec, indicative of limited community ambulator General Gait Details: pt using arm around therapist for support at times and others able to walk without UE support with increased sway. pt with unsteady gait with min assist for balance and  directional cues  Stairs            Wheelchair Mobility    Modified Rankin (Stroke Patients Only)       Balance Overall balance assessment: Needs assistance   Sitting balance-Leahy Scale: Fair       Standing balance-Leahy Scale: Fair Standing balance comment: pt can stand without UE support but requires assist with gait                             Pertinent Vitals/Pain Pain Assessment: No/denies pain    Home Living Family/patient expects to be discharged to:: Private residence Living Arrangements: Spouse/significant other Available Help at Discharge: Family;Available PRN/intermittently Type of Home: House Home Access: Stairs to enter   Entrance Stairs-Number of Steps: 2 Home Layout: One level Home Equipment: None Additional Comments: Pt said she lives alone then later reported she is married and lives with spouse. Currently states 1 story home which differs from prior admission stating 2 story    Prior Function Level of Independence: Independent               Hand Dominance        Extremity/Trunk Assessment   Upper Extremity Assessment Upper Extremity Assessment: Overall WFL for tasks assessed    Lower Extremity Assessment Lower Extremity Assessment: Overall WFL for tasks assessed    Cervical / Trunk Assessment Cervical / Trunk Assessment: Normal  Communication   Communication: No difficulties  Cognition Arousal/Alertness: Lethargic Behavior During Therapy: Flat affect Overall Cognitive Status: Impaired/Different from baseline Area of Impairment: Orientation;Attention;Memory;Problem solving;Following commands;Safety/judgement  Orientation Level: Disoriented to;Time;Situation Current Attention Level: Sustained Memory: Decreased short-term memory Following Commands: Follows one step commands consistently Safety/Judgement: Decreased awareness of safety;Decreased awareness of deficits   Problem Solving: Slow  processing;Decreased initiation;Requires verbal cues General Comments: pt not oriented to month or day, aware of place. Varied reports of home setup and even whom she lives with. Pt states she is married with a son but both work      General Comments      Exercises     Assessment/Plan    PT Assessment Patient needs continued PT services  PT Problem List Decreased strength;Decreased balance;Decreased cognition;Decreased mobility;Decreased knowledge of use of DME;Decreased activity tolerance;Decreased safety awareness       PT Treatment Interventions Gait training;Therapeutic activities;Stair training;Therapeutic exercise;Cognitive remediation;Neuromuscular re-education;DME instruction;Functional mobility training;Balance training;Patient/family education    PT Goals (Current goals can be found in the Care Plan section)  Acute Rehab PT Goals Patient Stated Goal: return home and to work PT Goal Formulation: With patient Time For Goal Achievement: 02/21/19 Potential to Achieve Goals: Fair    Frequency Min 3X/week   Barriers to discharge Decreased caregiver support spouse and son both work    Co-evaluation               AM-PAC PT "6 Clicks" Mobility  Outcome Measure Help needed turning from your back to your side while in a flat bed without using bedrails?: None Help needed moving from lying on your back to sitting on the side of a flat bed without using bedrails?: None Help needed moving to and from a bed to a chair (including a wheelchair)?: A Little Help needed standing up from a chair using your arms (e.g., wheelchair or bedside chair)?: A Little Help needed to walk in hospital room?: A Little Help needed climbing 3-5 steps with a railing? : A Little 6 Click Score: 20    End of Session Equipment Utilized During Treatment: Gait belt Activity Tolerance: Patient tolerated treatment well Patient left: in chair;with call bell/phone within reach Nurse Communication:  Mobility status PT Visit Diagnosis: Other abnormalities of gait and mobility (R26.89);Unsteadiness on feet (R26.81);History of falling (Z91.81)    Time: 2956-2130 PT Time Calculation (min) (ACUTE ONLY): 24 min   Charges:   PT Evaluation $PT Eval Moderate Complexity: 1 Mod          Shannon Herrera, PT Acute Rehabilitation Services Pager: [phone] Office: [phone]   Shannon Herrera 02/07/2019, 2:19 PM

## 2019-02-07 NOTE — Progress Notes (Signed)
Reason for consult: Status Epilepticus  Subjective: She is awake and oriented to name and place.  Following commands appropriately.  No further seizure since admission   ROS: negative except above   Examination  Vital signs in last 24 hours: Temp:  [97.4 F (36.3 C)-98.1 F (36.7 C)] 98.1 F (36.7 C) (04/28 0732) Pulse Rate:  [80-96] 96 (04/28 0800) Resp:  [11-16] 15 (04/28 0800) BP: (96-170)/(61-96) 150/89 (04/28 0800) SpO2:  [98 %-100 %] 98 % (04/28 0800) Weight:  [59.9 kg] 59.9 kg (04/28 0500)  General: lying in bed CVS: pulse-normal rate and rhythm RS: breathing comfortably Extremities: normal   Neuro: MS: Alert, oriented, follows commands CN: pupils equal and reactive,  EOMI, face symmetric, tongue midline, normal sensation over face, Motor: 5/5 strength in all 4 extremities Reflexes: 2+ bilaterally over patella, biceps, plantars: flexor Coordination: normal Gait: not tested  Basic Metabolic Panel: Recent Labs  Lab 02/05/19 1842 02/05/19 1847  02/05/19 2335 02/06/19 0041 02/06/19 0253 02/06/19 0757 02/07/19 0421  NA 134*  --    < > 132* 137 137 137 139  K 2.8*  --    < > 3.3* 3.9 3.5 4.0 3.7  CL 97*  --   --  100  --  103 105 108  CO2 19*  --   --  21*  --  24 24 21*  GLUCOSE 511*  --   --  315*  --  137* 163* 151*  BUN 15  --   --  10  --  9 10 9   CREATININE 1.16* 0.80  --  1.02*  --  0.87 0.95 0.83  CALCIUM 10.1  --   --  9.4  --  9.1 8.8* 8.9  MG 1.4*  --   --   --   --   --   --  1.6*   < > = values in this interval not displayed.    CBC: Recent Labs  Lab 02/05/19 1842 02/05/19 2026 02/05/19 2333 02/06/19 0041 02/07/19 0421  WBC 4.3  --  7.7  --  3.8*  NEUTROABS 2.8  --   --   --  2.2  HGB 11.1* 11.9* 11.2* 11.2* 10.2*  HCT 32.9* 35.0* 32.9* 33.0* 31.0*  MCV 92.4  --  92.2  --  96.6  PLT 102*  --  102*  --  96*     Coagulation Studies: Recent Labs    02/05/19 1842  LABPROT 13.4  INR 1.0    Imaging Reviewed:     ASSESSMENT  AND PLAN  63 year old female with past medical history of diabetes, alcohol abuse, hypertension admitted with diabetic ketoacidosis and status epilepticus.  Presented with right-sided weakness and right visual field deficits - stroke ruled out on MRI. Patient was loaded with fosphenytoin and Keppra patient clinically improved.  She has not had any further seizures and Dilantin has been discontinued.  Status epilepticus likely in the setting of DKA, possible alcohol withdrawal.  While this was provoked-since patient presented as status would recommend continuing Keppra upon discharge.  Whether to continue her on antiepileptic agents can be determined on outpatient follow-up.  Status epilepticus: Resolved Diabetic ketoacidosis Encephalopathy - likely multifactorial ( metabolic, following status epilepticus and possibly nutritional)   Recommendations Routine EEG Dilantin discontinued (by PCCM),recommend increasing Keppra to 750 mg twice daily Seizure precautions, driving for 6 months on discharge Continue Thiamine   Outpatient neurology follow-up.   [place] [facility], patients with seizures are not  allowed to drive until they have been seizure-free for six months. Use caution when using heavy equipment or power tools. Avoid working on ladders or at heights. Take showers instead of baths. Ensure the water temperature is not too high on the home water heater. Do not go swimming alone. Do not lock yourself in a room alone (i.e. bathroom). When caring for infants or small children, sit down when holding, feeding, or changing them to minimize risk of injury to the child in the event you have a seizure. Maintain good sleep hygiene. Avoid alcohol.    If Deni E Tullos has another seizure, call 911 and bring them back to the ED if:       A.  The seizure lasts longer than 5 minutes.            B.  The patient doesn't wake shortly after the seizure or has new problems such as difficulty  seeing, speaking or moving following the seizure       C.  The patient was injured during the seizure       D.  The patient has a temperature over 102 F (39C)       E.  The patient vomited during the seizure and now is having trouble breathing    Georgiana Spinner Aroor [facility] Pager Number [phone] For questions after 7pm please refer to AMION to reach the Neurologist on call

## 2019-02-07 NOTE — Progress Notes (Signed)
..  NAME:  Shannon Herrera, MRN:  [id number], DOB:  Aug 13, 1956, LOS: 2 ADMISSION DATE:  02/05/2019, CONSULTATION DATE:  02/05/2019 REFERRING MD:  Criss Alvine MD, CHIEF COMPLAINT:  AMS   Brief History   62 yr old F PMHx HTN, NIDDM, Hypothyroidism, and chronic ETOH use presents after multiple falls and AMS. 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 Known relapsing and remitting alcoholism, will hide her drinking. Unclear if she had been drinking recently, but Ethoh level negative in the ED.  Past Medical History  PMHx HTN, NIDDM, Hypothyroidism, and chronic ETOH use  Significant Hospital Events   Seizure activity, right hemiparesis with deviated gaze requiring Neurology consult  Consults:  PCCM>>>>>>>>02/05/2019 NEURO>>>>>>>>>02/05/2019  Procedures:  RIJ CVC 02/05/2019-   Significant Diagnostic Tests:  Admission labs: Na 134 K 2.8  Cl- 97 HCO3- 19 Glucose 511 AG 18 Albumin 4.3 Calcium 10.1 Mg 1.4 GFR 58 Trop <0.03 CK 63 Hgb 11.1 Hct 32.9 Plts 102 PT 13.4 INR 1 ETOH <10  Imaging: CT-scan of the brain-aspects 10.  No bleed CTA head and neck-no acute changes no LVO. MRI of the brain- could not stay still, only diffusion images obtained, motion degraded but no obvious large vessel large territory MCA stroke EEG: 4/27 >>  Micro Data:  H/o pan sens Ecoli in urine 07/03/2018 Has a h/o C.diff Sept 2019>>>> no diarrhea today [facility] 4/27 >> ng  Antimicrobials:     Interim history/subjective:    Objective   Blood pressure (!) 150/89, pulse 96, temperature 98.1 F (36.7 C), temperature source Oral, resp. rate 15, height 5\' 2"  (1.575 m), weight 59.9 kg, SpO2 98 %.        Intake/Output Summary (Last 24 hours) at 02/07/2019 1008 Last data filed at 02/07/2019 7829 Gross per 24 hour  Intake 1722.38 ml  Output 825 ml  Net 897.38 ml   Filed Weights   02/05/19 1800 02/06/19 0423 02/07/19 0500  Weight: 57.1 kg 55.7 kg 59.9 kg     Examination: Well-built, well-nourished, no distress Awake alert, able to answer questions, follows commands, strength 5/5 all 4 extremities Mild pallor, no icterus No JVD Clear breath sounds bilateral, S1-S2 tacky Soft nontender abdomen No edema    Assessment & Plan:   Acute metabolic encephalopathy w/ seizure activity and negative imaging. Low suspicion of infection.Wernicke's encephalopathy possible especially given malnutrition. Increase Keppra 750 twice daily Off Dilantin  ct thiamine.   Diabetic Ketoacidosis now resolved. DM-2 Continue Lantus 15 units with SSI Transition back to orals on discharge but was uncontrolled with HbA1c of 11 on admit on metformin  Thrombocytopenia related to alcohol related marrow suppression. Monitor daily  At risk for ETOH withdrawal given unclear history of recent Ethoh use. CIWA protocol dc Clonidine  Ct thiamine and folic acid   H/o Hypothyroidism - TSH normal  Resume Synthroid 6. HTN Urgency -resume Lopressor  Advance p.o., out of bed and PT consult Hypomagnesemia will be repleted  Transferred to floor and to triad 4/29  Cyril Mourning MD. [facility]. [facility] Pager [phone] If no response call [phone]      02/07/2019, 10:08 AM

## 2019-02-07 NOTE — Progress Notes (Addendum)
Witnessed patient pull out NG tube, unable to get to bedside fast enough to prevent patient from removing tube. Patient restraints and mitts noted to be properly applied. Patient was able to slide down in bed and hook NG tubing around mitted hand. Elink MD notified, awaiting orders and will continue to monitor.   02/07/19 1:48 AM Jahking Lesser A Kaidan Spengler   Orders given to reinsert NG tube per Dr. Dellie Catholic. NG tube inserted, awaiting X-ray to verify placement and will restart tube feeds per order. 02/07/19 2:11 AM Dow Adolph A Quinnlan Abruzzo

## 2019-02-08 DIAGNOSIS — F1010 Alcohol abuse, uncomplicated: Secondary | ICD-10-CM

## 2019-02-08 DIAGNOSIS — G40901 Epilepsy, unspecified, not intractable, with status epilepticus: Secondary | ICD-10-CM

## 2019-02-08 DIAGNOSIS — R569 Unspecified convulsions: Secondary | ICD-10-CM

## 2019-02-08 DIAGNOSIS — E1110 Type 2 diabetes mellitus with ketoacidosis without coma: Secondary | ICD-10-CM

## 2019-02-08 DIAGNOSIS — I10 Essential (primary) hypertension: Secondary | ICD-10-CM

## 2019-02-08 LAB — BASIC METABOLIC PANEL
Anion gap: 9 (ref 5–15)
BUN: 7 mg/dL — ABNORMAL LOW (ref 8–23)
CO2: 22 mmol/L (ref 22–32)
Calcium: 8.9 mg/dL (ref 8.9–10.3)
Chloride: 109 mmol/L (ref 98–111)
Creatinine, Ser: 0.87 mg/dL (ref 0.44–1.00)
GFR calc Af Amer: >60 mL/min (ref >60)
GFR calc non Af Amer: >60 mL/min (ref >60)
Glucose, Bld: 89 mg/dL (ref 70–99)
Potassium: 3.9 mmol/L (ref 3.5–5.1)
Sodium: 140 mmol/L (ref 135–145)

## 2019-02-08 LAB — MAGNESIUM: Magnesium: 1.9 mg/dL (ref 1.7–2.4)

## 2019-02-08 LAB — GLUCOSE, CAPILLARY
Glucose-Capillary: 102 mg/dL — ABNORMAL HIGH (ref 70–99)
Glucose-Capillary: 128 mg/dL — ABNORMAL HIGH (ref 70–99)
Glucose-Capillary: 162 mg/dL — ABNORMAL HIGH (ref 70–99)
Glucose-Capillary: 87 mg/dL (ref 70–99)

## 2019-02-08 LAB — PHOSPHORUS: Phosphorus: 3.9 mg/dL (ref 2.5–4.6)

## 2019-02-08 MED ORDER — FREESTYLE SYSTEM KIT: 1.0000 | PACK | Freq: Three times a day (TID) | 1 refills | Status: DC

## 2019-02-08 MED ORDER — INSULIN ASPART PROT & ASPART (70-30 MIX) 100 UNIT/ML ~~LOC~~ SUSP
12.0000 [IU] | Freq: Every day | SUBCUTANEOUS | Status: DC
  Administered 2019-02-08: 12 [IU] via SUBCUTANEOUS
  Filled 2019-02-08 (×2): qty 10

## 2019-02-08 MED ORDER — INSULIN ASPART 100 UNIT/ML ~~LOC~~ SOLN: 0.0000 [IU] | Freq: Three times a day (TID) | SUBCUTANEOUS | Status: DC

## 2019-02-08 MED ORDER — LEVETIRACETAM 750 MG PO TABS: 750.0000 mg | ORAL_TABLET | Freq: Two times a day (BID) | ORAL | 0 refills | Status: DC

## 2019-02-08 MED ORDER — METOPROLOL TARTRATE 50 MG PO TABS: 50.0000 mg | ORAL_TABLET | Freq: Two times a day (BID) | ORAL | 2 refills | Status: DC

## 2019-02-08 MED ORDER — VITAMIN B-1 100 MG PO TABS
100.0000 mg | ORAL_TABLET | Freq: Once | ORAL | Status: AC
  Administered 2019-02-08: 100 mg via ORAL
  Filled 2019-02-08: qty 1

## 2019-02-08 MED ORDER — INSULIN ASPART PROT & ASPART (70-30 MIX) 100 UNIT/ML ~~LOC~~ SUSP: 12.0000 [IU] | Freq: Two times a day (BID) | SUBCUTANEOUS | 11 refills | Status: DC

## 2019-02-08 MED ORDER — FOLIC ACID 1 MG PO TABS: 1.0000 mg | ORAL_TABLET | Freq: Every day | ORAL | 0 refills | Status: DC

## 2019-02-08 MED ORDER — INSULIN ASPART 100 UNIT/ML ~~LOC~~ SOLN: 0.0000 [IU] | Freq: Every day | SUBCUTANEOUS | Status: DC

## 2019-02-08 MED ORDER — VITAMIN B-1 100 MG PO TABS: 100.0000 mg | ORAL_TABLET | Freq: Every day | ORAL | 0 refills | Status: DC

## 2019-02-08 MED ORDER — INSULIN PEN NEEDLE 31G X 8 MM MISC: 1.0000 | Freq: Two times a day (BID) | 0 refills | Status: DC

## 2019-02-08 MED FILL — FOLIC ACID 1 MG TABS: 1 | 30 days supply | Qty: 30 | Fill #0

## 2019-02-08 MED FILL — THIAMINE HCL 100 MG TABS: 100 | 30 days supply | Qty: 30 | Fill #0

## 2019-02-08 MED FILL — levETIRAcetam 500 MG TABS: 500 | 30 days supply | Qty: 90 | Fill #0

## 2019-02-08 MED FILL — NOVOLIN 70/30 100 UNITS/ML: (70-30) 100 | 28 days supply | Qty: 10 | Fill #0

## 2019-02-08 MED FILL — ULTICARE INS 0.3 ML 30GX1/2: 30G X 1/2" | 30 days supply | Qty: 60 | Fill #0

## 2019-02-08 MED FILL — METOPROLOL TARTRATE 50 MG T: 50 | 30 days supply | Qty: 60 | Fill #0

## 2019-02-08 NOTE — Progress Notes (Signed)
Physical Therapy Treatment Patient Details Name: Shannon Herrera MRN: [id number] DOB: 1956-04-25 Today's Date: 02/08/2019    History of Present Illness 63 yr old F PMHx HTN, NIDDM, Hypothyroidism, and chronic ETOH use presents after multiple falls and AMS. Pt with metabolic encephalopathy with seizure with negative imaging    PT Comments    Continuing work on functional mobility and activity tolerance;  Noting very good improvements in gait, balance, and activity tolerance; walked multiple hallways in hospital, no gross loss of balance, and she was able to recover from her small wobbles without physical assist; High level balance deficits remain, and I continue to endorse HHPT follow up   Follow Up Recommendations  Home health PT;Supervision/Assistance - 24 hour     Equipment Recommendations  None recommended by PT    Recommendations for Other Services       Precautions / Restrictions Precautions Precautions: Fall Precaution Comments: Fall risk much improved from yesterday's PT session    Mobility  Bed Mobility Overal bed mobility: Independent                Transfers Overall transfer level: Modified independent   Transfers: Sit to/from Stand Sit to Stand: Supervision;Modified independent (Device/Increase time)         General transfer comment: Supervision with sit to stand from low bed; independent from armchair; performed serial sit<>stands from armchair with and without use of UEs for pushoff  Ambulation/Gait Ambulation/Gait assistance: Min guard;Supervision Gait Distance (Feet): 600 Feet(more than) Assistive device: None Gait Pattern/deviations: Step-through pattern     General Gait Details: Minguard progressing to Supervision; occasional bigger step width, indicative of loss of balance/less stability in single limb stand -- pt was able to recover without physical assist   Stairs Stairs: Yes Stairs assistance: Min guard Stair Management: One rail  Right;Alternating pattern;Forwards Number of Stairs: 8 General stair comments: overall no difficulty   Wheelchair Mobility    Modified Rankin (Stroke Patients Only)       Balance     Sitting balance-Leahy Scale: Good       Standing balance-Leahy Scale: Good Standing balance comment: Much improved                            Cognition Arousal/Alertness: Awake/alert Behavior During Therapy: WFL for tasks assessed/performed Overall Cognitive Status: Within Functional Limits for tasks assessed(for simple mobility activites)                                        Exercises      General Comments General comments (skin integrity, edema, etc.): She looks forward to getting home      Pertinent Vitals/Pain Pain Assessment: No/denies pain    Home Living                      Prior Function            PT Goals (current goals can now be found in the care plan section) Acute Rehab PT Goals Patient Stated Goal: return home and to work PT Goal Formulation: With patient Time For Goal Achievement: 02/21/19 Potential to Achieve Goals: Fair Progress towards PT goals: Progressing toward goals    Frequency    Min 3X/week      PT Plan Current plan remains appropriate;Equipment recommendations need to be updated    Co-evaluation  AM-PAC PT "6 Clicks" Mobility   Outcome Measure  Help needed turning from your back to your side while in a flat bed without using bedrails?: None Help needed moving from lying on your back to sitting on the side of a flat bed without using bedrails?: None Help needed moving to and from a bed to a chair (including a wheelchair)?: None Help needed standing up from a chair using your arms (e.g., wheelchair or bedside chair)?: None Help needed to walk in hospital room?: None Help needed climbing 3-5 steps with a railing? : A Little 6 Click Score: 23    End of Session Equipment Utilized  During Treatment: Gait belt Activity Tolerance: Patient tolerated treatment well Patient left: in bed;with call bell/phone within reach Nurse Communication: Mobility status PT Visit Diagnosis: Other abnormalities of gait and mobility (R26.89);Unsteadiness on feet (R26.81);History of falling (Z91.81)     Time: 2130-8657 PT Time Calculation (min) (ACUTE ONLY): 13 min  Charges:  $Gait Training: 8-22 mins                     Van Clines, [place]  Acute Rehabilitation Services Pager [phone] Office [phone]    Levi Aland 02/08/2019, 2:40 PM

## 2019-02-08 NOTE — Progress Notes (Signed)
DISCHARGE NOTE HOME Shannon Herrera to be discharged Home. per MD order. Discussed prescriptions and follow up appointments with the patient. Prescriptions given to patient; medication list explained in detail. Patient verbalized understanding.  Skin clean, dry and intact without evidence of skin break down, no evidence of skin tears noted. IV catheter discontinued intact. Site without signs and symptoms of complications. Dressing and pressure applied. Pt denies pain at the site currently. No complaints noted.  Patient free of lines, drains, and wounds.   An After Visit Summary (AVS) was printed and given to the patient. Patient escorted via wheelchair, and discharged home via private auto.  Margretta Sidle, RN

## 2019-02-08 NOTE — Discharge Summary (Signed)
Physician Discharge Summary  Shannon Herrera KVQ:[id number] DOB: December 25, 1955 DOA: 02/05/2019  PCP: Patient, No Pcp Per  Admit date: 02/05/2019 Discharge date: 02/08/2019  Admitted From: home  Disposition:  home   Recommendations for Outpatient Follow-up:  1. F/u on alcohol abuse  2. Check A1c in 1 month 3. Needs neuro f/u in 2-3 months  Home Health:  ordered  Discharge Condition:  stable   CODE STATUS:  Full code   Diet recommendation:  Carb modified Consultations:  Neuro  PCCM    Discharge Diagnoses:  Principal Problem:   Acute metabolic encephalopathy Active Problems:   Status epilepticus (HCC)   Essential hypertension   Hypothyroidism   DKA, type 2, not at goal [facility])   Alcohol abuse   Hospital Course:  Shannon Herrera is a 63 y.o. 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 Noted to have a left gaze preference and right sided weakness along with aphasia. CBG was in 500s.  Given 2 mg of Ativan, Keppra 1 gm and Dilantin 1 gm in ED. CT head, CTA head and neck and MRI negative in ED. Admitted by PCCM  Principal Problem:    Acute metabolic encephalopathy- suspected to be Status epilepticus - will need to continue Keppra 750 mg BID and Thiamine and f/u with Neuro as outpt - EEG was normal - she was initially fed with an NG tube -  this was removed once alert and she is now tolerating solid food. Ambulating without much difficulty- need HHPT which I have ordered.     Active Problems:   DKA, type 2, not at goal  - she has not been checking her sugars at home  And A1c is found to be 11 - initially started on Lantus on SSI however, she has no insurance and cannot afford to pay for it- I have changed her to 70/30 and for now, she will get assistance from the hospital in obtaining it.  - she has given her self insulin in the past and he no concerns about  being able to administer it - d/c Metformin in setting of alcohol use and probably underlying cirrhosis - glucometer ordered and advised to check sugars regularly and f/u with PCP    Alcohol abuse - per her husband who gave the history to the ICU team, she has been known to hide her excessive drinking - ETOH was negative in the ED and she admits to me to drink 2 drinks a day only - Suspect she drinks more than she admits to- social worker consult placed  Thrombocytopenia -  Chronic issue and possibly from drinking- outpt f/u    Discharge Exam: Vitals:   02/08/19 0424 02/08/19 1133  BP: 124/68 (!) 144/73  Pulse: 78 80  Resp: 18 16  Temp: 98.2 F (36.8 C) 97.9 F (36.6 C)  SpO2: 99% 100%   Vitals:   02/07/19 1455 02/07/19 2028 02/08/19 0424 02/08/19 1133  BP: (!) 142/82 130/81 124/68 (!) 144/73  Pulse: 94 92 78 80  Resp:  19 18 16   Temp: 99 F (37.2 C) 97.9 F (36.6 C) 98.2 F (36.8 C) 97.9 F (36.6 C)  TempSrc: Oral Oral Oral Oral  SpO2: 98% 100% 99% 100%  Weight:  54.4 kg    Height:        General: Pt is alert, awake, not in acute distress Cardiovascular: RRR, S1/S2 +,  no rubs, no gallops Respiratory: CTA bilaterally, no wheezing, no rhonchi Abdominal: Soft, NT, ND, bowel sounds + Extremities: no edema, no cyanosis   Discharge Instructions  Discharge Instructions    Diet - low sodium heart healthy   Complete by:  As directed    Diet Carb Modified   Complete by:  As directed    Increase activity slowly   Complete by:  As directed      Allergies as of 02/08/2019   No Known Allergies     Medication List    STOP taking these medications   acetaminophen 500 MG tablet Commonly known as:  TYLENOL   metFORMIN 1000 MG tablet Commonly known as:  GLUCOPHAGE   ondansetron 4 MG disintegrating tablet Commonly known as:  Zofran ODT   potassium chloride SA 20 MEQ tablet Commonly known as:  K-DUR   sertraline 25 MG tablet- has not been compliant with  this Commonly known as:  Zoloft     TAKE these medications   folic acid 1 MG tablet Commonly known as:  FOLVITE Take 1 tablet (1 mg total) by mouth daily.   glucose monitoring kit monitoring kit 1 each by Does not apply route 4 (four) times daily - after meals and at bedtime. 1 month Diabetic Testing Supplies for QAC-QHS accuchecks.   insulin aspart protamine- aspart (70-30) 100 UNIT/ML injection Commonly known as:  NOVOLOG MIX 70/30 Inject 0.12 mLs (12 Units total) into the skin 2 (two) times daily with a meal.   Insulin Pen Needle 31G X 8 MM Misc Commonly known as:  TechLite Pen Needles 1 each by Does not apply route 2 (two) times a day.   levETIRAcetam 750 MG tablet Commonly known as:  Keppra Take 1 tablet (750 mg total) by mouth 2 (two) times daily.   levothyroxine 100 MCG tablet Commonly known as:  SYNTHROID Take 1 tablet (100 mcg total) by mouth daily before breakfast.   metoprolol tartrate 50 MG tablet Commonly known as:  LOPRESSOR Take 1 tablet (50 mg total) by mouth 2 (two) times daily.   thiamine 100 MG tablet Commonly known as:  VITAMIN B-1 Take 1 tablet (100 mg total) by mouth daily.      Follow-up Information    [facility] Follow up.   Specialty:  Internal Medicine Why:  PCP Office will call you prior to appt time at 11:20.  This appt wll be a virtual visit Contact information: [address] [place] [address] [phone]       [facility] Follow up.   Specialty:  Home Health Services Why:  Physical Therapy Contact information: [address] [place] [address] [phone]          No Known Allergies   Procedures/Studies:  EEG- 4/28  Ct Angio Head W Or Wo Contrast  Result Date: 02/05/2019 CLINICAL Herrera:  Initial evaluation for acute right-sided weakness, nonverbal. EXAM: CT ANGIOGRAPHY HEAD AND NECK CT PERFUSION BRAIN TECHNIQUE: Multidetector CT imaging of the head and  neck was performed using the standard protocol during bolus administration of intravenous contrast. Multiplanar CT image reconstructions and MIPs were obtained to evaluate the vascular anatomy. Carotid stenosis measurements (when applicable) are obtained utilizing NASCET criteria, using the distal internal carotid diameter as the denominator. Multiphase CT imaging of the brain was performed following IV bolus contrast injection. Subsequent parametric perfusion maps were calculated using RAPID software. CONTRAST:  OMNIPAQUE IOHEXOL 350 MG/ML SOLN  COMPARISON:  Prior CT from earlier the same day. FINDINGS: CTA NECK FINDINGS Aortic arch: Visualized aortic arch of normal caliber with normal 3 vessel morphology. No flow-limiting stenosis about the origin of the great vessels. Visualized subclavian arteries widely patent. Right carotid system: Right common carotid artery patent from its origin to the bifurcation without stenosis. No significant atheromatous narrowing about the right bifurcation. Right ICA mildly tortuous but widely patent to the skull base without stenosis, dissection, or occlusion. Left carotid system: Left common carotid artery patent from its origin to the bifurcation without stenosis. Mild a centric calcified plaque about the left bifurcation without hemodynamically significant stenosis. Left ICA mildly tortuous but widely patent distally to the skull base without stenosis, dissection or occlusion. Vertebral arteries: Both of the vertebral arteries arise from the subclavian arteries. Vertebral arteries widely patent within the neck without stenosis, dissection, or occlusion. Skeleton: No acute osseous finding. No discrete lytic or blastic osseous lesions. Mild to moderate cervical spondylolysis at C5-6 and C6-7. Other neck: No other acute soft tissue abnormality within the neck. Salivary glands within normal limits. Thyroid normal. No adenopathy. Upper chest: Visualized upper chest within normal  limits. Visualized lungs are clear. Review of the MIP images confirms the above findings CTA HEAD FINDINGS Anterior circulation: Petrous segments widely patent bilaterally. Mild scattered atheromatous irregularity within the cavernous/supraclinoid ICAs without flow-limiting stenosis. ICA termini well perfused. A1 segments patent bilaterally. Normal anterior communicating artery. Anterior cerebral arteries patent to their distal aspects without hemodynamically significant stenosis. M1 segments patent bilaterally. Normal MCA bifurcations. Distal MCA branches well perfused and symmetric. Posterior circulation: Vertebral arteries patent to the vertebrobasilar junction without flow-limiting stenosis. Posterior inferior cerebral arteries patent bilaterally. Basilar artery diminutive but patent to its distal aspect without stenosis. Superior cerebral arteries patent bilaterally. Left PCA supplied via the basilar. Predominant fetal type origin of the right PCA PCAs patent to their distal aspects without flow-limiting stenosis or occlusion. Venous sinuses: Grossly patent allowing for timing of the contrast bolus. Anatomic variants: Fetal type origin of the right PCA. Overall diminutive vertebrobasilar system. Delayed phase: Not performed. Review of the MIP images confirms the above findings CT Brain Perfusion Findings: ASPECTS: 10 CBF (<30%) Volume: 0mL Perfusion (Tmax>6.0s) volume: 0mL Mismatch Volume: 0mL Infarction Location:Negative CT perfusion for acute ischemic infarct. IMPRESSION: 1. Negative CTA for large vessel occlusion. 2. Negative CT perfusion. No evidence for core infarct or perfusion abnormality. 3. Mild atherosclerotic change about the left carotid bifurcation and carotid siphons for patient age. No hemodynamically significant or correctable stenosis. Critical Value/emergent results were called by telephone at the time of interpretation on 02/05/2019 at 7:15 pm to Dr. Milon Dikes , who verbally acknowledged  these results. Electronically Signed   By: Rise Mu M.D.   On: 02/05/2019 19:56   Dg Abd 1 View  Result Date: 02/07/2019 CLINICAL Herrera:  Initial evaluation for NG tube placement EXAM: ABDOMEN - 1 VIEW COMPARISON:  Prior radiograph she from 02/06/2019 FINDINGS: Enteric tube in place, overlying the stomach, partially coiled back on itself. Tip projects over the gastric body. Side hole well beyond the GE junction. Visualized bowel gas pattern within normal limits. Partially visualized lungs are clear. Right IJ approach centra venous catheter noted. IMPRESSION: Tip of enteric tube overlying the gastric body, side hole well beyond the GE junction. Electronically Signed   By: Rise Mu M.D.   On: 02/07/2019 02:52   Dg Abd 1 View  Result Date: 02/06/2019 CLINICAL Herrera:  Nasogastric tube positioning. EXAM:  ABDOMEN - 1 VIEW COMPARISON:  Radiographs 02/05/2019. FINDINGS: 1035 hours. Enteric tube projects into the left upper abdomen, likely within the mid stomach. Right IJ central venous catheter projects to the level of the mid right atrium. The heart size and mediastinal contours are stable. There is mild atelectasis at both lung bases. The visualized bowel gas pattern is normal. IMPRESSION: Enteric tube has been advanced into the mid stomach. Central line appears unchanged at the level the mid right atrium. Electronically Signed   By: Carey Bullocks M.D.   On: 02/06/2019 10:48   Dg Abd 1 View  Result Date: 02/06/2019 CLINICAL Herrera:  63 year old female NG tube placement. EXAM: ABDOMEN - 1 VIEW COMPARISON:  Recent portable chest radiograph. CT Abdomen and Pelvis 05/01/2018. FINDINGS: Portable AP supine view at 2347 hours. Enteric tube courses to the gastroesophageal junction but is looped at that level and the tip is directed cephalad. The patient is rotated to the right. Negative visible lung bases. Mediastinal central line redemonstrated. Negative visible bowel gas pattern. Chronic  calcific pancreatitis. IMPRESSION: 1. The enteric tube is looped at the GEJ level. But if the tube is advanced 5-6 more cm it may position appropriately within the stomach. 2. Negative visible bowel gas pattern. 3. Chronic calcific pancreatitis. Electronically Signed   By: Odessa Fleming M.D.   On: 02/06/2019 00:20   Ct Angio Neck W Or Wo Contrast  Result Date: 02/05/2019 CLINICAL Herrera:  Initial evaluation for acute right-sided weakness, nonverbal. EXAM: CT ANGIOGRAPHY HEAD AND NECK CT PERFUSION BRAIN TECHNIQUE: Multidetector CT imaging of the head and neck was performed using the standard protocol during bolus administration of intravenous contrast. Multiplanar CT image reconstructions and MIPs were obtained to evaluate the vascular anatomy. Carotid stenosis measurements (when applicable) are obtained utilizing NASCET criteria, using the distal internal carotid diameter as the denominator. Multiphase CT imaging of the brain was performed following IV bolus contrast injection. Subsequent parametric perfusion maps were calculated using RAPID software. CONTRAST:  OMNIPAQUE IOHEXOL 350 MG/ML SOLN COMPARISON:  Prior CT from earlier the same day. FINDINGS: CTA NECK FINDINGS Aortic arch: Visualized aortic arch of normal caliber with normal 3 vessel morphology. No flow-limiting stenosis about the origin of the great vessels. Visualized subclavian arteries widely patent. Right carotid system: Right common carotid artery patent from its origin to the bifurcation without stenosis. No significant atheromatous narrowing about the right bifurcation. Right ICA mildly tortuous but widely patent to the skull base without stenosis, dissection, or occlusion. Left carotid system: Left common carotid artery patent from its origin to the bifurcation without stenosis. Mild a centric calcified plaque about the left bifurcation without hemodynamically significant stenosis. Left ICA mildly tortuous but widely patent distally to the skull  base without stenosis, dissection or occlusion. Vertebral arteries: Both of the vertebral arteries arise from the subclavian arteries. Vertebral arteries widely patent within the neck without stenosis, dissection, or occlusion. Skeleton: No acute osseous finding. No discrete lytic or blastic osseous lesions. Mild to moderate cervical spondylolysis at C5-6 and C6-7. Other neck: No other acute soft tissue abnormality within the neck. Salivary glands within normal limits. Thyroid normal. No adenopathy. Upper chest: Visualized upper chest within normal limits. Visualized lungs are clear. Review of the MIP images confirms the above findings CTA HEAD FINDINGS Anterior circulation: Petrous segments widely patent bilaterally. Mild scattered atheromatous irregularity within the cavernous/supraclinoid ICAs without flow-limiting stenosis. ICA termini well perfused. A1 segments patent bilaterally. Normal anterior communicating artery. Anterior cerebral arteries patent to their  distal aspects without hemodynamically significant stenosis. M1 segments patent bilaterally. Normal MCA bifurcations. Distal MCA branches well perfused and symmetric. Posterior circulation: Vertebral arteries patent to the vertebrobasilar junction without flow-limiting stenosis. Posterior inferior cerebral arteries patent bilaterally. Basilar artery diminutive but patent to its distal aspect without stenosis. Superior cerebral arteries patent bilaterally. Left PCA supplied via the basilar. Predominant fetal type origin of the right PCA PCAs patent to their distal aspects without flow-limiting stenosis or occlusion. Venous sinuses: Grossly patent allowing for timing of the contrast bolus. Anatomic variants: Fetal type origin of the right PCA. Overall diminutive vertebrobasilar system. Delayed phase: Not performed. Review of the MIP images confirms the above findings CT Brain Perfusion Findings: ASPECTS: 10 CBF (<30%) Volume: 0mL Perfusion (Tmax>6.0s)  volume: 0mL Mismatch Volume: 0mL Infarction Location:Negative CT perfusion for acute ischemic infarct. IMPRESSION: 1. Negative CTA for large vessel occlusion. 2. Negative CT perfusion. No evidence for core infarct or perfusion abnormality. 3. Mild atherosclerotic change about the left carotid bifurcation and carotid siphons for patient age. No hemodynamically significant or correctable stenosis. Critical Value/emergent results were called by telephone at the time of interpretation on 02/05/2019 at 7:15 pm to Dr. Milon Dikes , who verbally acknowledged these results. Electronically Signed   By: Rise Mu M.D.   On: 02/05/2019 19:56   Mr Brain Wo Contrast  Result Date: 02/05/2019 CLINICAL Herrera:  63 year old female code stroke with right side weakness. Agitated. EXAM: MRI HEAD WITHOUT CONTRAST TECHNIQUE: Multiplanar, multiecho pulse sequences of the brain and surrounding structures were obtained without intravenous contrast. COMPARISON:  CTA head and neck, CT perfusion today. FINDINGS: The examination had to be discontinued prior to completion due to patient agitation. Axial and coronal diffusion weighted imaging and motion degraded axial SWI only is obtained. No convincing No restricted diffusion or evidence of acute infarction. No midline shift, ventriculomegaly, or intracranial mass effect. SWI imaging is motion degraded, no obvious cerebral blood products. Abnormal diffusion in the right scalp soft tissues compatible with scalp hematoma along the right anterior frontal convexity (series 5, image 93). IMPRESSION: Limited noncontrast brain MRI appears negative for acute infarct. This was communicated to Dr. Otelia Limes at 7:57 pm on 02/05/2019 by text page via the [facility] messaging system. Electronically Signed   By: Odessa Fleming M.D.   On: 02/05/2019 19:57   Ct Cerebral Perfusion W Contrast  Result Date: 02/05/2019 CLINICAL Herrera:  Initial evaluation for acute right-sided weakness, nonverbal. EXAM: CT  ANGIOGRAPHY HEAD AND NECK CT PERFUSION BRAIN TECHNIQUE: Multidetector CT imaging of the head and neck was performed using the standard protocol during bolus administration of intravenous contrast. Multiplanar CT image reconstructions and MIPs were obtained to evaluate the vascular anatomy. Carotid stenosis measurements (when applicable) are obtained utilizing NASCET criteria, using the distal internal carotid diameter as the denominator. Multiphase CT imaging of the brain was performed following IV bolus contrast injection. Subsequent parametric perfusion maps were calculated using RAPID software. CONTRAST:  OMNIPAQUE IOHEXOL 350 MG/ML SOLN COMPARISON:  Prior CT from earlier the same day. FINDINGS: CTA NECK FINDINGS Aortic arch: Visualized aortic arch of normal caliber with normal 3 vessel morphology. No flow-limiting stenosis about the origin of the great vessels. Visualized subclavian arteries widely patent. Right carotid system: Right common carotid artery patent from its origin to the bifurcation without stenosis. No significant atheromatous narrowing about the right bifurcation. Right ICA mildly tortuous but widely patent to the skull base without stenosis, dissection, or occlusion. Left carotid system: Left common carotid artery patent  from its origin to the bifurcation without stenosis. Mild a centric calcified plaque about the left bifurcation without hemodynamically significant stenosis. Left ICA mildly tortuous but widely patent distally to the skull base without stenosis, dissection or occlusion. Vertebral arteries: Both of the vertebral arteries arise from the subclavian arteries. Vertebral arteries widely patent within the neck without stenosis, dissection, or occlusion. Skeleton: No acute osseous finding. No discrete lytic or blastic osseous lesions. Mild to moderate cervical spondylolysis at C5-6 and C6-7. Other neck: No other acute soft tissue abnormality within the neck. Salivary glands within  normal limits. Thyroid normal. No adenopathy. Upper chest: Visualized upper chest within normal limits. Visualized lungs are clear. Review of the MIP images confirms the above findings CTA HEAD FINDINGS Anterior circulation: Petrous segments widely patent bilaterally. Mild scattered atheromatous irregularity within the cavernous/supraclinoid ICAs without flow-limiting stenosis. ICA termini well perfused. A1 segments patent bilaterally. Normal anterior communicating artery. Anterior cerebral arteries patent to their distal aspects without hemodynamically significant stenosis. M1 segments patent bilaterally. Normal MCA bifurcations. Distal MCA branches well perfused and symmetric. Posterior circulation: Vertebral arteries patent to the vertebrobasilar junction without flow-limiting stenosis. Posterior inferior cerebral arteries patent bilaterally. Basilar artery diminutive but patent to its distal aspect without stenosis. Superior cerebral arteries patent bilaterally. Left PCA supplied via the basilar. Predominant fetal type origin of the right PCA PCAs patent to their distal aspects without flow-limiting stenosis or occlusion. Venous sinuses: Grossly patent allowing for timing of the contrast bolus. Anatomic variants: Fetal type origin of the right PCA. Overall diminutive vertebrobasilar system. Delayed phase: Not performed. Review of the MIP images confirms the above findings CT Brain Perfusion Findings: ASPECTS: 10 CBF (<30%) Volume: 0mL Perfusion (Tmax>6.0s) volume: 0mL Mismatch Volume: 0mL Infarction Location:Negative CT perfusion for acute ischemic infarct. IMPRESSION: 1. Negative CTA for large vessel occlusion. 2. Negative CT perfusion. No evidence for core infarct or perfusion abnormality. 3. Mild atherosclerotic change about the left carotid bifurcation and carotid siphons for patient age. No hemodynamically significant or correctable stenosis. Critical Value/emergent results were called by telephone at the  time of interpretation on 02/05/2019 at 7:15 pm to Dr. Milon Dikes , who verbally acknowledged these results. Electronically Signed   By: Rise Mu M.D.   On: 02/05/2019 19:56   Dg Chest Portable 1 View  Result Date: 02/05/2019 CLINICAL Herrera:  Central line placement EXAM: PORTABLE CHEST 1 VIEW COMPARISON:  07/03/2018 FINDINGS: Right IJ central venous catheter tip over the cavoatrial region. No pneumothorax. No focal consolidation or effusion. Normal heart size. No pneumothorax. IMPRESSION: Right IJ central venous catheter tip over the cavoatrial region. Negative for a pneumothorax Electronically Signed   By: Jasmine Pang M.D.   On: 02/05/2019 21:55   Ct Head Code Stroke Wo Contrast  Result Date: 02/05/2019 CLINICAL Herrera:  Code stroke.  Possible stroke.  Right weakness. EXAM: CT HEAD WITHOUT CONTRAST TECHNIQUE: Contiguous axial images were obtained from the base of the skull through the vertex without intravenous contrast. COMPARISON:  CT head 07/03/2018 FINDINGS: Brain: Ventricle size normal. Mild atrophy. Mild patchy hypodensity in the periventricular white matter is chronic and unchanged. Negative for acute infarct.  Negative for hemorrhage or mass. Vascular: Slight hyperdensity in the right middle cerebral artery could represent artifact. Skull: Negative Sinuses/Orbits: Negative Other: Right frontal scalp contusion, question injury ASPECTS ([place]berta Stroke Program Early CT Score) - Ganglionic level infarction (caudate, lentiform nuclei, internal capsule, insula, M1-M3 cortex): 7 - Supraganglionic infarction (M4-M6 cortex): 3 Total score (0-10 with 10 being  normal): 10 IMPRESSION: 1.    No acute infarct or hemorrhage 2. Slight hyperdensity right MCA likely artifact given the patient's right-sided weakness. 3. ASPECTS is 10 4. Right frontal soft tissue contusion, question injury 5. These results were called by telephone at the time of interpretation on 02/05/2019 at 7:04 pm to Dr. Wilford Corner , who  verbally acknowledged these results. Electronically Signed   By: Marlan Palau M.D.   On: 02/05/2019 19:08     The results of significant diagnostics from this hospitalization (including imaging, microbiology, ancillary and laboratory) are listed below for reference.     Microbiology: Recent Results (from the past 240 hour(s))  MRSA PCR Screening     Status: None   Collection Time: 02/05/19 11:36 PM  Result Value Ref Range Status   MRSA by PCR NEGATIVE NEGATIVE Final    Comment:        The GeneXpert MRSA Assay (FDA approved for NASAL specimens only), is one component of a comprehensive MRSA colonization surveillance program. It is not intended to diagnose MRSA infection nor to guide or monitor treatment for MRSA infections. Performed at [facility], [address] [place], [place] [address]   Culture, blood (routine x 2)     Status: None (Preliminary result)   Collection Time: 02/06/19  5:25 AM  Result Value Ref Range Status   Specimen Description BLOOD RIGHT HAND  Final   Special Requests   Final    BOTTLES DRAWN AEROBIC AND ANAEROBIC Blood Culture adequate volume   Culture   Final    NO GROWTH 1 DAY Performed at [facility], [address], [place], [place] [address]    Report Status PENDING  Incomplete  Culture, blood (routine x 2)     Status: None (Preliminary result)   Collection Time: 02/06/19  5:31 AM  Result Value Ref Range Status   Specimen Description BLOOD LEFT WRIST  Final   Special Requests   Final    BOTTLES DRAWN AEROBIC ONLY Blood Culture adequate volume   Culture   Final    NO GROWTH 1 DAY Performed at [facility], [address] [place], [place] [address]    Report Status PENDING  Incomplete     Labs: BNP (last 3 results) No results for input(s): BNP in the last 8760 hours. Basic Metabolic Panel: Recent Labs  Lab 02/05/19 1842  02/05/19 2335 02/06/19 0041 02/06/19 0253 02/06/19 0757 02/07/19 0421 02/08/19 0417  NA  134*   < > 132* 137 137 137 139 140  K 2.8*   < > 3.3* 3.9 3.5 4.0 3.7 3.9  CL 97*  --  100  --  103 105 108 109  CO2 19*  --  21*  --  24 24 21* 22  GLUCOSE 511*  --  315*  --  137* 163* 151* 89  BUN 15  --  10  --  9 10 9  7*  CREATININE 1.16*   < > 1.02*  --  0.87 0.95 0.83 0.87  CALCIUM 10.1  --  9.4  --  9.1 8.8* 8.9 8.9  MG 1.4*  --   --   --   --   --  1.6* 1.9  PHOS  --   --   --   --   --   --   --  3.9   < > = values in this interval not displayed.   Liver Function Tests: Recent Labs  Lab 02/05/19 1842  AST 33  ALT 28  ALKPHOS 67  BILITOT 0.8  PROT 7.2  ALBUMIN 4.3   No results for input(s): LIPASE, AMYLASE in the last 168 hours. Recent Labs  Lab 02/05/19 2220  AMMONIA 39*   CBC: Recent Labs  Lab 02/05/19 1842 02/05/19 2026 02/05/19 2333 02/06/19 0041 02/07/19 0421  WBC 4.3  --  7.7  --  3.8*  NEUTROABS 2.8  --   --   --  2.2  HGB 11.1* 11.9* 11.2* 11.2* 10.2*  HCT 32.9* 35.0* 32.9* 33.0* 31.0*  MCV 92.4  --  92.2  --  96.6  PLT 102*  --  102*  --  96*   Cardiac Enzymes: Recent Labs  Lab 02/05/19 1842 02/05/19 1846  CKTOTAL  --  63  TROPONINI <0.03  --    BNP: Invalid input(s): POCBNP CBG: Recent Labs  Lab 02/07/19 2030 02/08/19 0007 02/08/19 0426 02/08/19 0716 02/08/19 1123  GLUCAP 253* 128* 87 102* 162*   D-Dimer No results for input(s): DDIMER in the last 72 hours. Hgb A1c Recent Labs    02/05/19 2333  HGBA1C 11.1*   Lipid Profile No results for input(s): CHOL, HDL, LDLCALC, TRIG, CHOLHDL, LDLDIRECT in the last 72 hours. Thyroid function studies Recent Labs    02/05/19 2220  TSH 3.471   Anemia work up Recent Labs    02/05/19 2220  VITAMINB12 390   Urinalysis    Component Value Date/Time   COLORURINE COLORLESS (A) 02/05/2019 2202   APPEARANCEUR CLEAR 02/05/2019 2202   LABSPEC 1.011 02/05/2019 2202   PHURINE 7.0 02/05/2019 2202   GLUCOSEU >=500 (A) 02/05/2019 2202   HGBUR NEGATIVE 02/05/2019 2202   BILIRUBINUR  NEGATIVE 02/05/2019 2202   KETONESUR 5 (A) 02/05/2019 2202   PROTEINUR NEGATIVE 02/05/2019 2202   NITRITE NEGATIVE 02/05/2019 2202   LEUKOCYTESUR NEGATIVE 02/05/2019 2202   Sepsis Labs Invalid input(s): PROCALCITONIN,  WBC,  LACTICIDVEN Microbiology Recent Results (from the past 240 hour(s))  MRSA PCR Screening     Status: None   Collection Time: 02/05/19 11:36 PM  Result Value Ref Range Status   MRSA by PCR NEGATIVE NEGATIVE Final    Comment:        The GeneXpert MRSA Assay (FDA approved for NASAL specimens only), is one component of a comprehensive MRSA colonization surveillance program. It is not intended to diagnose MRSA infection nor to guide or monitor treatment for MRSA infections. Performed at [facility], [address] [place], [place] [address]   Culture, blood (routine x 2)     Status: None (Preliminary result)   Collection Time: 02/06/19  5:25 AM  Result Value Ref Range Status   Specimen Description BLOOD RIGHT HAND  Final   Special Requests   Final    BOTTLES DRAWN AEROBIC AND ANAEROBIC Blood Culture adequate volume   Culture   Final    NO GROWTH 1 DAY Performed at [facility], [address] [place], [place] [address]    Report Status PENDING  Incomplete  Culture, blood (routine x 2)     Status: None (Preliminary result)   Collection Time: 02/06/19  5:31 AM  Result Value Ref Range Status   Specimen Description BLOOD LEFT WRIST  Final   Special Requests   Final    BOTTLES DRAWN AEROBIC ONLY Blood Culture adequate volume   Culture   Final    NO GROWTH 1 DAY Performed at [facility], [address] [place], [place] [address]    Report Status PENDING  Incomplete     Time coordinating discharge in minutes: 65  SIGNED:   Calvert Cantor, MD  [facility] 02/08/2019, 12:51 PM Pager   If 7PM-7AM, please contact night-coverage [email or web address] Password [email or web address]

## 2019-02-08 NOTE — TOC Initial Note (Signed)
Transition of Care [facility]) - Initial/Assessment Note    Patient Details  Name: Shannon Herrera MRN: [id number] Date of Birth: [birth year]  Transition of Care [facility] CM/SW Contact:    Bess Kinds, RN Phone Number: [phone] 02/08/2019, 11:06 AM  Clinical Narrative:                 Spoke with patient at bedside. PTA patient living in an apartment alone. 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   Discussed Substance Abuse resources - handout provided from CSW.   Medications provided by [facility] pharmacy - Medications matched for 0 copay.   Hospital f/u appointment scheduled for next week 5/7 with Patient Care.   HH services for PT per PT recommendations arrange with [facility].   Patient was able to arrange for someone to pick her up. No further transition of care needs identified.   Expected Discharge Plan: Home w Home Health Services Barriers to Discharge: No Barriers Identified   Patient Goals and CMS Choice Patient states their goals for this hospitalization and ongoing recovery are:: ready to go home CMS Medicare.gov Compare Post Acute Care list provided to:: Patient Choice offered to / list presented to : Patient  Expected Discharge Plan and Services Expected Discharge Plan: Home w Home Health Services In-house Referral: Clinical Social Work Discharge Planning Services: CM Consult, Medication Assistance, MATCH Program Post Acute Care Choice: Home Health Living arrangements for the past 2 months: Apartment Expected Discharge Date: 02/08/19               DME Arranged: N/A DME Agency: NA       HH Arranged: PT HH Agency: [facility] Date [facility] Agency Contacted: 02/08/19 Time HH Agency Contacted: [phone]    Prior Living Arrangements/Services Living arrangements for the past 2 months: Apartment Lives with:: Self Patient language and need for interpreter reviewed:: Yes Do you feel safe  going back to the place where you live?: Yes      Need for Family Participation in Patient Care: Yes (Comment)     Criminal Activity/Legal Involvement Pertinent to Current Situation/Hospitalization: No - Comment as needed  Activities of Daily Living      Permission Sought/Granted                  Emotional Assessment Appearance:: Appears stated age Attitude/Demeanor/Rapport: Engaged Affect (typically observed): Accepting Orientation: : Oriented to Self, Oriented to  Time, Oriented to Place, Oriented to Situation Alcohol / Substance Use: Alcohol Use Psych Involvement: No (comment)  Admission diagnosis:  Hypokalemia [E87.6] Hypomagnesemia [E83.42] Diabetic ketoacidosis without coma associated with type 2 diabetes mellitus (HCC) [E11.10] Acute metabolic encephalopathy [G93.41] Patient Active Problem List   Diagnosis Date Noted  . Acute metabolic encephalopathy 02/05/2019  . Diabetic acidosis without coma (HCC)   . H/O ETOH abuse   . C. difficile colitis 07/05/2018  . Syncope and collapse 07/03/2018  . Hyperglycemia 01/22/2018  . Essential hypertension 01/22/2018  . Alcohol withdrawal syndrome with complication, with unspecified complication (HCC) 01/22/2018  . Elevated LFTs 01/22/2018  . Lactic acidosis 01/22/2018  . Hypothyroidism 01/22/2018  . Thrombocytopenia (HCC) 01/22/2018  . Hypomagnesemia 01/22/2018  . Acute renal injury (HCC) 01/22/2018   PCP:  Patient, No Pcp Per Pharmacy:   [facility] [address], [place] - [address] [place] [address] Phone: [phone] Fax: [phone]  [facility]  Guinevere Ferrari, [place] - [address] [place] [address] Phone: [phone] Fax: [phone]     Social Determinants of Health (SDOH) Interventions    Readmission Risk Interventions No flowsheet data found.

## 2019-02-08 NOTE — Progress Notes (Signed)
Shannon Herrera to be discharged Home per MD order. Discussed prescriptions and follow up appointments with the patient. Prescriptions given to patient; medication list explained in detail. Medications delivered by the transition pharmacy to the room. Patient verbalized understanding.  Skin clean, dry and intact without evidence of skin break down, no evidence of skin tears noted. IV catheter discontinued intact. Site without signs and symptoms of complications. Dressing and pressure applied. Pt denies pain at the site currently. No complaints noted.  Patient free of lines, drains, and wounds.   An After Visit Summary (AVS) was printed and given to the patient. Patient escorted via wheelchair, and discharged home via private auto.  Arvilla Meres, RN

## 2019-02-08 NOTE — Progress Notes (Signed)
Substance abuse resource packet provided to patient.   [place], [place] [phone]

## 2019-02-08 NOTE — Progress Notes (Addendum)
Inpatient Diabetes Program Recommendations  AACE/ADA: New Consensus Statement on Inpatient Glycemic Control (2015)  Target Ranges:  Prepandial:   less than 140 mg/dL      Peak postprandial:   less than 180 mg/dL (1-2 hours)      Critically ill patients:  140 - 180 mg/dL   Lab Results  Component Value Date   GLUCAP 102 (H) 02/08/2019   HGBA1C 11.1 (H) 02/05/2019    Review of Glycemic Control  Pt to be discharged this am. Will be going home on Novolog 70/30 12 units bid. RN gave pt 70/30 vial and pt received insulin prescription from OP Pharmacy. Will f/u with [facility] on 02/16/19. This will be a virtual visit. Spoke with RN and pt said she knew how to give insulin injections, although she did not demonstrate. Will continue to try and reach pt on phone and make sure she is comfortable going home on insulin. If Novolog is needed at lunch, RN to allow pt to give her injection. Reminded RN to reiterate importance of taking insulin WITH food and checking blood sugars prior to eating. Metformin has been discontinued for home meds. Will continue to try and reach pt by phone since working remotely d/t Covid 19 restrictions. Secure text with Case Manager regarding f/u appt and meds.  Thank you. Ailene Ards, RD, LDN, CDE Inpatient Diabetes Coordinator [phone]  Addendum [facility] with pt regarding HgbA1C of 11.1% and importance of reducing to 7% to prevent complicaitons from diabetes. Instructed pt to check blood sugars 3-4x/day and call or fax readings to [facility] prior to appt next Thursday. Discussed hypoglycemia s/s and treatment. Discussed importance of taking insulin with food as prescribed and eating healthy well-balanced diet with portion control with CHOs. Answered questions and pt states she feels good about going home and controlling her blood sugars.

## 2019-02-08 NOTE — Evaluation (Signed)
Occupational Therapy Evaluation Patient Details Name: Shannon Herrera MRN: [id number] DOB: 1956-03-20 Today's Date: 02/08/2019    History of Present Illness 63 yr old F PMHx HTN, NIDDM, Hypothyroidism, and chronic ETOH use presents after multiple falls and AMS. Pt with metabolic encephalopathy with seizure with negative imaging   Clinical Impression   Pt overall mod I with simple ADL activity.  Pts spouse with A as needed    Follow Up Recommendations  No OT follow up    Equipment Recommendations  None recommended by OT    Recommendations for Other Services       Precautions / Restrictions Precautions Precautions: Fall Precaution Comments: Fall risk much improved from yesterday's PT session      Mobility Bed Mobility Overal bed mobility: Independent                Transfers Overall transfer level: Modified independent   Transfers: Sit to/from Stand Sit to Stand: Modified independent (Device/Increase time)         General transfer comment: Supervision with sit to stand from low bed; independent from armchair; performed serial sit<>stands from armchair with and without use of UEs for pushoff    Balance Overall balance assessment: Mild deficits observed, not formally tested   Sitting balance-Leahy Scale: Good       Standing balance-Leahy Scale: Good Standing balance comment: Much improved                           ADL either performed or assessed with clinical judgement   ADL Overall ADL's : Modified independent                                             Vision Baseline Vision/History: No visual deficits              Pertinent Vitals/Pain Pain Assessment: No/denies pain     Hand Dominance     Extremity/Trunk Assessment         Cervical / Trunk Assessment Cervical / Trunk Assessment: Normal   Communication Communication Communication: No difficulties   Cognition Arousal/Alertness: Awake/alert Behavior  During Therapy: WFL for tasks assessed/performed Overall Cognitive Status: Within Functional Limits for tasks assessed(for simple mobility activites)                                     General Comments  She looks forward to getting home            Home Living Family/patient expects to be discharged to:: Private residence Living Arrangements: Spouse/significant other Available Help at Discharge: Family;Available PRN/intermittently Type of Home: House Home Access: Stairs to enter [employer] of Steps: 2   Home Layout: One level     Bathroom Shower/Tub: Tub/shower unit;Walk-in shower   Bathroom Toilet: Standard     Home Equipment: None   Additional Comments: Pt said she lives alone then later reported she is married and lives with spouse. Currently states 1 story home which differs from prior admission stating 2 story      Prior Functioning/Environment Level of Independence: Independent                          OT Goals(Current goals can be found in  the care plan section) Acute Rehab OT Goals Patient Stated Goal: return home and to work OT Goal Formulation: With patient  OT Frequency:      AM-PAC OT "6 Clicks" Daily Activity     Outcome Measure Help from another person eating meals?: None Help from another person taking care of personal grooming?: None Help from another person toileting, which includes using toliet, bedpan, or urinal?: None Help from another person bathing (including washing, rinsing, drying)?: None Help from another person to put on and taking off regular upper body clothing?: None Help from another person to put on and taking off regular lower body clothing?: None 6 Click Score: 24   End of Session Equipment Utilized During Treatment: Gait belt;Rolling walker Nurse Communication: Mobility status  Activity Tolerance: Patient tolerated treatment well Patient left: in chair;with call bell/phone within reach                    Time: 1445-1505 OT Time Calculation (min): 20 min Charges:  OT General Charges $OT Visit: 1 Visit OT Evaluation $OT Eval Moderate Complexity: 1 Mod  Lise Auer, OT Acute Rehabilitation Services [phone] Office- [phone]     Sasha Rogel, Karin Golden D 02/08/2019, 3:47 PM

## 2019-02-08 NOTE — Discharge Instructions (Signed)
°  Per [facility] statutes, patients with seizures are not allowed to drive until they have been seizure-free for six months. Use caution when using heavy equipment or power tools. Avoid working on ladders or at heights. Take showers instead of baths. Ensure the water temperature is not too high on the home water heater. Do not go swimming alone. Do not lock yourself in a room alone (i.e. bathroom). When caring for infants or small children, sit down when holding, feeding, or changing them to minimize risk of injury to the child in the event you have a seizure. Maintain good sleep hygiene. Avoid alcohol.      If Shannon Herrera has another seizure, call 911 and bring them back to the ED if:       A.  The seizure lasts longer than 5 minutes.            B.  The patient doesn't wake shortly after the seizure or has new problems such as difficulty seeing, speaking or moving following the seizure       C.  The patient was injured during the seizure       D.  The patient has a temperature over 102 F (39C)       E.  The patient vomited during the seizure and now is having trouble breathing  You were cared for by a hospitalist during your hospital stay. If you have any questions about your discharge medications or the care you received while you were in the hospital after you are discharged, you can call the unit and asked to speak with the hospitalist on call if the hospitalist that took care of you is not available. Once you are discharged, your primary care physician will handle any further medical issues.   Please note that NO REFILLS for any discharge medications will be authorized once you are discharged, as it is imperative that you return to your primary care physician (or establish a relationship with a primary care physician if you do not have one) for your aftercare needs so that they can reassess your need for medications and monitor your lab values.  Please take all your medications with you  for your next visit with your Primary MD. Please ask your Primary MD to get all Hospital records sent to his/her office. Please request your Primary MD to go over all hospital test results at the follow up.

## 2019-02-10 ENCOUNTER — Ambulatory Visit: Payer: Self-pay | Admitting: Family Medicine

## 2019-02-11 LAB — CULTURE, BLOOD (ROUTINE X 2)
Culture: NO GROWTH
Culture: NO GROWTH
Special Requests: ADEQUATE
Special Requests: ADEQUATE

## 2019-02-16 ENCOUNTER — Ambulatory Visit: Payer: Self-pay | Admitting: Family Medicine

## 2019-04-29 ENCOUNTER — Inpatient Hospital Stay (HOSPITAL_COMMUNITY)
Admission: EM | Admit: 2019-04-29 | Discharge: 2019-05-06 | DRG: 082 | Disposition: A | Discharge status: Home / Self Care | Payer: Self-pay | Attending: Neurosurgery | Admitting: Neurosurgery

## 2019-04-29 ENCOUNTER — Other Ambulatory Visit: Payer: Self-pay

## 2019-04-29 ENCOUNTER — Emergency Department (HOSPITAL_COMMUNITY): Payer: Self-pay

## 2019-04-29 ENCOUNTER — Encounter (HOSPITAL_COMMUNITY): Payer: Self-pay | Admitting: *Deleted

## 2019-04-29 DIAGNOSIS — F1010 Alcohol abuse, uncomplicated: Secondary | ICD-10-CM | POA: Diagnosis present

## 2019-04-29 DIAGNOSIS — R4701 Aphasia: Secondary | ICD-10-CM | POA: Diagnosis present

## 2019-04-29 DIAGNOSIS — E039 Hypothyroidism, unspecified: Secondary | ICD-10-CM | POA: Diagnosis present

## 2019-04-29 DIAGNOSIS — Z79899 Other long term (current) drug therapy: Secondary | ICD-10-CM

## 2019-04-29 DIAGNOSIS — Z452 Encounter for adjustment and management of vascular access device: Secondary | ICD-10-CM

## 2019-04-29 DIAGNOSIS — I10 Essential (primary) hypertension: Secondary | ICD-10-CM | POA: Diagnosis present

## 2019-04-29 DIAGNOSIS — Z794 Long term (current) use of insulin: Secondary | ICD-10-CM

## 2019-04-29 DIAGNOSIS — E876 Hypokalemia: Secondary | ICD-10-CM | POA: Diagnosis present

## 2019-04-29 DIAGNOSIS — Z1159 Encounter for screening for other viral diseases: Secondary | ICD-10-CM

## 2019-04-29 DIAGNOSIS — E785 Hyperlipidemia, unspecified: Secondary | ICD-10-CM | POA: Diagnosis present

## 2019-04-29 DIAGNOSIS — S066X9A Traumatic subarachnoid hemorrhage with loss of consciousness of unspecified duration, initial encounter: Principal | ICD-10-CM | POA: Diagnosis present

## 2019-04-29 DIAGNOSIS — Y92009 Unspecified place in unspecified non-institutional (private) residence as the place of occurrence of the external cause: Secondary | ICD-10-CM

## 2019-04-29 DIAGNOSIS — J9601 Acute respiratory failure with hypoxia: Secondary | ICD-10-CM | POA: Diagnosis present

## 2019-04-29 DIAGNOSIS — X58XXXA Exposure to other specified factors, initial encounter: Secondary | ICD-10-CM | POA: Diagnosis present

## 2019-04-29 DIAGNOSIS — J9690 Respiratory failure, unspecified, unspecified whether with hypoxia or hypercapnia: Secondary | ICD-10-CM

## 2019-04-29 DIAGNOSIS — I609 Nontraumatic subarachnoid hemorrhage, unspecified: Secondary | ICD-10-CM

## 2019-04-29 DIAGNOSIS — E874 Mixed disorder of acid-base balance: Secondary | ICD-10-CM | POA: Diagnosis present

## 2019-04-29 DIAGNOSIS — E1165 Type 2 diabetes mellitus with hyperglycemia: Secondary | ICD-10-CM | POA: Diagnosis present

## 2019-04-29 DIAGNOSIS — Z79890 Hormone replacement therapy (postmenopausal): Secondary | ICD-10-CM

## 2019-04-29 DIAGNOSIS — Z978 Presence of other specified devices: Secondary | ICD-10-CM

## 2019-04-29 DIAGNOSIS — I739 Peripheral vascular disease, unspecified: Secondary | ICD-10-CM

## 2019-04-29 DIAGNOSIS — R29734 NIHSS score 34: Secondary | ICD-10-CM | POA: Diagnosis present

## 2019-04-29 DIAGNOSIS — G40909 Epilepsy, unspecified, not intractable, without status epilepticus: Secondary | ICD-10-CM | POA: Diagnosis present

## 2019-04-29 MED ORDER — ETOMIDATE 2 MG/ML IV SOLN
INTRAVENOUS | Status: AC | PRN
  Administered 2019-04-29: 20 mg via INTRAVENOUS

## 2019-04-29 MED ORDER — PROPOFOL 1000 MG/100ML IV EMUL
INTRAVENOUS | Status: AC
  Administered 2019-04-29: 5 ug/kg/min via INTRAVENOUS
  Filled 2019-04-29: qty 100

## 2019-04-29 MED ORDER — LORAZEPAM 2 MG/ML IJ SOLN
2.0000 mg | Freq: Once | INTRAMUSCULAR | Status: AC
  Administered 2019-04-29: 2 mg via INTRAVENOUS

## 2019-04-29 MED ORDER — LIDOCAINE HCL (CARDIAC) PF 100 MG/5ML IV SOSY
PREFILLED_SYRINGE | INTRAVENOUS | Status: AC | PRN
  Administered 2019-04-29: 100 mg via INTRAVENOUS

## 2019-04-29 MED ORDER — LEVETIRACETAM IN NACL 1000 MG/100ML IV SOLN
1000.0000 mg | Freq: Once | INTRAVENOUS | Status: AC
  Administered 2019-04-30: 1000 mg via INTRAVENOUS
  Filled 2019-04-29: qty 100

## 2019-04-29 MED ORDER — LORAZEPAM 2 MG/ML IJ SOLN
INTRAMUSCULAR | Status: AC
  Filled 2019-04-29: qty 1

## 2019-04-29 MED ORDER — PROPOFOL 1000 MG/100ML IV EMUL
5.0000 ug/kg/min | INTRAVENOUS | Status: DC
  Administered 2019-04-29: 5 ug/kg/min via INTRAVENOUS

## 2019-04-29 MED ORDER — ROCURONIUM BROMIDE 50 MG/5ML IV SOLN
INTRAVENOUS | Status: AC | PRN
  Administered 2019-04-29: 100 mg via INTRAVENOUS

## 2019-04-29 NOTE — ED Provider Notes (Signed)
[facility] EMERGENCY DEPARTMENT Provider Note   CSN: [id number] Arrival date & time: 04/29/19  2325     History   Chief Complaint Chief Complaint  Patient presents with   Seizures    HPI Shannon Herrera is a 63 y.o. female.     Level 5 caveat for acute of condition.  Patient brought in by EMS after apparent seizure-like activity.  She is obtunded and unresponsive.  Has a history of alcohol abuse and apparent seizure history but is not take any seizure medications.  Unclear when her last drink was.  She has a large hematoma to left forehead, left-sided gaze and is not moving her right side.  She is moving her left side spontaneously.  She is not able to give any history.  There is no witnessed seizure activity on arrival.  Her last seen normal is unknown.  It is unknown her last drink was.  She has also has a history of diabetes and hypertension.  Patient given Ativan on arrival for suspected seizure activity that is ongoing  The history is provided by the patient and the EMS personnel. The history is limited by the condition of the patient.  Seizures   Past Medical History:  Diagnosis Date   Diabetes mellitus without complication ([place])    Hypertension    Hypothyroid     Patient Active Problem List   Diagnosis Date Noted   DKA, type 2, not at goal [facility]) 02/08/2019   Status epilepticus ([place]) 02/08/2019   Alcohol abuse 62/69/4854   Acute metabolic encephalopathy 62/70/3500   Diabetic acidosis without coma ([place])    H/O ETOH abuse    C. difficile colitis 07/05/2018   Syncope and collapse 07/03/2018   Hyperglycemia 01/22/2018   Essential hypertension 01/22/2018   Alcohol withdrawal syndrome with complication, with unspecified complication ([place]) 93/81/8299   Elevated LFTs 01/22/2018   Lactic acidosis 01/22/2018   Hypothyroidism 01/22/2018   Thrombocytopenia ([place]) 01/22/2018   Hypomagnesemia 01/22/2018   Acute renal injury ([place]) 01/22/2018    Past  Surgical History:  Procedure Laterality Date   denies       OB History   No obstetric history on file.      Home Medications    Prior to Admission medications   Medication Sig Start Date End Date Taking? Authorizing Provider  folic acid (FOLVITE) 1 MG tablet Take 1 tablet (1 mg total) by mouth daily. 02/08/19   Debbe Odea, MD  glucose monitoring kit (FREESTYLE) monitoring kit 1 each by Does not apply route 4 (four) times daily - after meals and at bedtime. 1 month Diabetic Testing Supplies for QAC-QHS accuchecks. 02/08/19   Debbe Odea, MD  insulin aspart protamine- aspart (NOVOLOG MIX 70/30) (70-30) 100 UNIT/ML injection Inject 0.12 mLs (12 Units total) into the skin 2 (two) times daily with a meal. 02/08/19   Debbe Odea, MD  Insulin Pen Needle (TECHLITE PEN NEEDLES) 31G X 8 MM MISC 1 each by Does not apply route 2 (two) times a day. 02/08/19   Debbe Odea, MD  levETIRAcetam (KEPPRA) 750 MG tablet Take 1 tablet (750 mg total) by mouth 2 (two) times daily. 02/08/19   Debbe Odea, MD  levothyroxine (SYNTHROID, LEVOTHROID) 100 MCG tablet Take 1 tablet (100 mcg total) by mouth daily before breakfast. 07/06/18 11/03/18  Gladstone Lighter, MD  metoprolol tartrate (LOPRESSOR) 50 MG tablet Take 1 tablet (50 mg total) by mouth 2 (two) times daily. 02/08/19   Debbe Odea, MD  thiamine (VITAMIN  B-1) 100 MG tablet Take 1 tablet (100 mg total) by mouth daily. 02/08/19   Debbe Odea, MD    Family History Family History  Problem Relation Age of Onset   Hypertension Mother     Social History Social History   Tobacco Use   Smoking status: Never Smoker   Smokeless tobacco: Never Used  Substance Use Topics   Alcohol use: Yes    Comment: intoxicated at this time   Drug use: Never     Allergies   Patient has no known allergies.   Review of Systems Review of Systems  Unable to perform ROS: Mental status change  Neurological: Positive for seizures.     Physical  Exam Updated Vital Signs BP (!) 142/95    Pulse (!) 112    Temp (!) 100.6 F (38.1 C)    Resp 18    Ht '5\' 3"'  (1.6 m)    Wt 54.4 kg    SpO2 100%    BMI 21.24 kg/m   Physical Exam Vitals signs and nursing note reviewed.  Constitutional:      General: She is in acute distress.     Appearance: She is well-developed. She is ill-appearing and toxic-appearing.     Comments: obtunded, left-sided gaze, large hematoma to left Scalp  HENT:     Head:     Comments: L left scalp hematoma    Nose: Nose normal. No rhinorrhea.     Mouth/Throat:     Pharynx: No oropharyngeal exudate.  Eyes:     Conjunctiva/sclera: Conjunctivae normal.     Pupils: Pupils are equal, round, and reactive to light.  Neck:     Musculoskeletal: Normal range of motion and neck supple.     Comments: No meningismus. Cardiovascular:     Rate and Rhythm: Normal rate and regular rhythm.     Heart sounds: Normal heart sounds. No murmur.  Pulmonary:     Effort: Pulmonary effort is normal. No respiratory distress.     Breath sounds: Normal breath sounds.  Chest:     Chest wall: No tenderness.  Abdominal:     Palpations: Abdomen is soft.     Tenderness: There is no abdominal tenderness. There is no guarding or rebound.  Musculoskeletal: Normal range of motion.        General: No tenderness.  Skin:    General: Skin is warm.  Neurological:     Mental Status: She is alert.     Motor: No abnormal muscle tone.     Coordination: Coordination abnormal.     Comments:  Obtunded, left-sided gaze,  Some spontaneous movement.   Follows some commands on the right but not on the left.      ED Treatments / Results  Labs (all labs ordered are listed, but only abnormal results are displayed) Labs Reviewed  CBC WITH DIFFERENTIAL/PLATELET - Abnormal; Notable for the following components:      Result Value   RBC 3.84 (*)    Platelets 145 (*)    Lymphs Abs 4.2 (*)    All other components within normal limits  COMPREHENSIVE  METABOLIC PANEL - Abnormal; Notable for the following components:   Potassium 3.0 (*)    Glucose, Bld 232 (*)    Creatinine, Ser 1.12 (*)    Total Protein 8.6 (*)    AST 67 (*)    ALT 49 (*)    Total Bilirubin 1.8 (*)    GFR calc non Af Amer 52 (*)  All other components within normal limits  LACTIC ACID, PLASMA - Abnormal; Notable for the following components:   Lactic Acid, Venous 2.0 (*)    All other components within normal limits  PROTIME-INR - Abnormal; Notable for the following components:   Prothrombin Time 15.5 (*)    All other components within normal limits  CULTURE, BLOOD (ROUTINE X 2)  CULTURE, BLOOD (ROUTINE X 2)  URINE CULTURE  ETHANOL  APTT  LACTIC ACID, PLASMA  URINALYSIS, ROUTINE W REFLEX MICROSCOPIC  DIFFERENTIAL  RAPID URINE DRUG SCREEN, HOSP PERFORMED  BLOOD GAS, ARTERIAL  CBG MONITORING, ED  I-STAT CHEM 8, ED    EKG EKG Interpretation  Date/Time:  Sunday April 30 2019 03:04:27 EDT Ventricular Rate:  110 PR Interval:    QRS Duration: 82 QT Interval:  367 QTC Calculation: 497 R Axis:   13 Text Interpretation:  Sinus tachycardia Ventricular premature complex Aberrant complex Anteroseptal infarct, age indeterminate No significant change was found Confirmed by Ezequiel Essex [phone]) on 04/30/2019 3:09:05 AM Also confirmed by Ezequiel Essex [phone]), editor Philomena Doheny [phone])  on 04/30/2019 7:10:30 AM   Radiology Ct Head Wo Contrast  Result Date: 04/30/2019 CLINICAL DATA:  63 year old female with seizure. Code stroke. EXAM: CT HEAD WITHOUT CONTRAST CT CERVICAL SPINE WITHOUT CONTRAST TECHNIQUE: Multidetector CT imaging of the head and cervical spine was performed following the standard protocol without intravenous contrast. Multiplanar CT image reconstructions of the cervical spine were also generated. COMPARISON:  Head CT dated 02/05/2019 FINDINGS: CT HEAD FINDINGS Brain: There is mild age-related atrophy and moderate chronic microvascular ischemic  changes. Moderate amount of subarachnoid hemorrhage in the left frontotemporal lobe extending into the left sylvian fissure and left central sulcus. Smaller amount of subarachnoid hemorrhage noted in the right frontal and parietal convexity. There is a large amount of blood in the quadrigeminal plate cistern and layering inferior to the right tentorium and along the right cerebellopontine angle. There is extension of the blood into the superior portion of the fourth ventricle. There is subarachnoid hemorrhage in the superior aspect of the right cerebellar hemisphere. No ventriculomegaly. No significant mass effect. No midline shift. Vascular: Grossly unremarkable on suboptimal visualization. Skull: Normal. Negative for fracture or focal lesion. Sinuses/Orbits: There is mild diffuse mucoperiosteal thickening of paranasal sinuses with air-fluid level in the right maxillary sinus and posterior nasal passages and nasopharynx. The mastoid air cells are clear. Other: A support tube is partially visualized. There is a large left temporal and parietal scalp hematoma. CT CERVICAL SPINE FINDINGS Alignment: No acute subluxation. Skull base and vertebrae: No acute fracture. Osteopenia. Soft tissues and spinal canal: No prevertebral fluid or swelling. No visible canal hematoma. Disc levels: Multilevel degenerative changes most prominent and C5-C6 and C6-C7. Upper chest: Negative. Other: An endotracheal and an enteric tube partially visualized. IMPRESSION: 1. Extensive intracranial hemorrhage as described, likely traumatic. The possibility of an aneurysm is not entirely excluded and cannot be evaluated on this noncontrast CT. No significant mass effect. No midline shift. Close follow-up recommended. 2. No acute/traumatic cervical spine pathology. 3. Large left temporal and parietal scalp hematoma. These results were called by telephone at the time of interpretation on 04/30/2019 at 12:15 am to Dr. Ezequiel Essex , who verbally  acknowledged these results. Electronically Signed   By: Anner Crete M.D.   On: 04/30/2019 00:17   Ct Cervical Spine Wo Contrast  Result Date: 04/30/2019 CLINICAL DATA:  63 year old female with seizure. Code stroke. EXAM: CT HEAD WITHOUT CONTRAST CT CERVICAL SPINE  WITHOUT CONTRAST TECHNIQUE: Multidetector CT imaging of the head and cervical spine was performed following the standard protocol without intravenous contrast. Multiplanar CT image reconstructions of the cervical spine were also generated. COMPARISON:  Head CT dated 02/05/2019 FINDINGS: CT HEAD FINDINGS Brain: There is mild age-related atrophy and moderate chronic microvascular ischemic changes. Moderate amount of subarachnoid hemorrhage in the left frontotemporal lobe extending into the left sylvian fissure and left central sulcus. Smaller amount of subarachnoid hemorrhage noted in the right frontal and parietal convexity. There is a large amount of blood in the quadrigeminal plate cistern and layering inferior to the right tentorium and along the right cerebellopontine angle. There is extension of the blood into the superior portion of the fourth ventricle. There is subarachnoid hemorrhage in the superior aspect of the right cerebellar hemisphere. No ventriculomegaly. No significant mass effect. No midline shift. Vascular: Grossly unremarkable on suboptimal visualization. Skull: Normal. Negative for fracture or focal lesion. Sinuses/Orbits: There is mild diffuse mucoperiosteal thickening of paranasal sinuses with air-fluid level in the right maxillary sinus and posterior nasal passages and nasopharynx. The mastoid air cells are clear. Other: A support tube is partially visualized. There is a large left temporal and parietal scalp hematoma. CT CERVICAL SPINE FINDINGS Alignment: No acute subluxation. Skull base and vertebrae: No acute fracture. Osteopenia. Soft tissues and spinal canal: No prevertebral fluid or swelling. No visible canal hematoma.  Disc levels: Multilevel degenerative changes most prominent and C5-C6 and C6-C7. Upper chest: Negative. Other: An endotracheal and an enteric tube partially visualized. IMPRESSION: 1. Extensive intracranial hemorrhage as described, likely traumatic. The possibility of an aneurysm is not entirely excluded and cannot be evaluated on this noncontrast CT. No significant mass effect. No midline shift. Close follow-up recommended. 2. No acute/traumatic cervical spine pathology. 3. Large left temporal and parietal scalp hematoma. These results were called by telephone at the time of interpretation on 04/30/2019 at 12:15 am to Dr. Ezequiel Essex , who verbally acknowledged these results. Electronically Signed   By: Anner Crete M.D.   On: 04/30/2019 00:17    Procedures Procedure Name: Intubation Date/Time: 04/30/2019 2:15 AM Performed by: Ezequiel Essex, MD Pre-anesthesia Checklist: Patient identified, Patient being monitored, Emergency Drugs available, Timeout performed and Suction available Oxygen Delivery Method: Non-rebreather mask Preoxygenation: Pre-oxygenation with 100% oxygen Induction Type: Rapid sequence Ventilation: Mask ventilation without difficulty Laryngoscope Size: Glidescope and 4 Grade View: Grade I Tube size: 7.5 mm Number of attempts: 1 Airway Equipment and Method: Video-laryngoscopy and Rigid stylet Placement Confirmation: ETT inserted through vocal cords under direct vision,  CO2 detector and Breath sounds checked- equal and bilateral Secured at: 25 cm Tube secured with: ETT holder Dental Injury: Teeth and Oropharynx as per pre-operative assessment  Future Recommendations: Recommend- induction with short-acting agent, and alternative techniques readily available      (including critical care time)  Medications Ordered in ED Medications  LORazepam (ATIVAN) 2 MG/ML injection (has no administration in time range)  propofol (DIPRIVAN) 1000 MG/100ML infusion (has no  administration in time range)  levETIRAcetam (KEPPRA) IVPB 1000 mg/100 mL premix (has no administration in time range)  propofol (DIPRIVAN) 1000 MG/100ML infusion (has no administration in time range)  LORazepam (ATIVAN) injection 2 mg (has no administration in time range)  lidocaine (cardiac) 100 mg/53m (XYLOCAINE) injection 2% (100 mg Intravenous Given 04/29/19 2341)  etomidate (AMIDATE) injection (20 mg Intravenous Given 04/29/19 2342)  rocuronium (ZEMURON) injection (100 mg Intravenous Given 04/29/19 2342)     Initial Impression / Assessment and  Plan / ED Course  I have reviewed the triage vital signs and the nursing notes.  Pertinent labs & imaging results that were available during my care of the patient were reviewed by me and considered in my medical decision making (see chart for details).       Patient obtunded after questionable seizure activity and head injury.  She has a left-sided gaze.  She has some movement of the right side of her body but not on the left.  Patient intubated for airway protection emergent head CT.  There is concern for cranial hemorrhage.  She was loaded with IV Keppra and IV Ativan.  CT scan shows diffuse subarachnoid hemorrhage, likely traumatic.  Patient does not have any blood thinners on her medication list.  We will maintain blood pressure goal of 601 systolic.  Patient neurosurgery at [facility] Discussed with NP Mey[place] accepts to Dr. Saintclair Halsted service.  Agrees with blood pressure goal of 093 systolic. Patient given labetalol.  Did spike temperature to 102.  Broad-spectrum antibiotics started after cultures obtained.  Patient given sepsis protocol including fluids and IV antibiotics after cultures were obtained.  Questionable aspiration considered.  Discussed with patient's husband by phone who states he found her down on the ground with questionable seizure activity.  He does not know when her last drink was.  He does not know if she takes anything  for seizures.  Admission to [facility], ICU discussed with Dr. Oletta Darter.    CRITICAL CARE Performed by: Ezequiel Essex Total critical care time: 90 minutes Critical care time was exclusive of separately billable procedures and treating other patients. Critical care was necessary to treat or prevent imminent or life-threatening deterioration. Critical care was time spent personally by me on the following activities: development of treatment plan with patient and/or surrogate as well as nursing, discussions with consultants, evaluation of patient's response to treatment, examination of patient, obtaining history from patient or surrogate, ordering and performing treatments and interventions, ordering and review of laboratory studies, ordering and review of radiographic studies, pulse oximetry and re-evaluation of patient's condition.  [place] was evaluated in Emergency Department on 04/30/2019 for the symptoms described in the history of present illness. She was evaluated in the context of the global COVID-19 pandemic, which necessitated consideration that the patient might be at risk for infection with the SARS-CoV-2 virus that causes COVID-19. Institutional protocols and algorithms that pertain to the evaluation of patients at risk for COVID-19 are in a state of rapid change based on information released by regulatory bodies including the CDC and federal and state organizations. These policies and algorithms were followed during the patient's care in the ED.   Final Clinical Impressions(s) / ED Diagnoses   Final diagnoses:  SAH (subarachnoid hemorrhage) [facility])    ED Discharge Orders    None       Zilphia Kozinski, Annie Main, MD 04/30/19 [phone]

## 2019-04-29 NOTE — ED Triage Notes (Signed)
Pt arrived to er after family called ems out for ?seizure type activity, family reports that pt has hx of seizures and would not respond appropriately to them. Upon arrival to er pt has large hematoma to left side of head, eyes deviated to left, Dr Wyvonnia Dusky called to bedside,

## 2019-04-29 NOTE — Code Documentation (Signed)
Dr Wyvonnia Dusky at bedside for intubation,

## 2019-04-30 ENCOUNTER — Inpatient Hospital Stay (HOSPITAL_COMMUNITY): Payer: Self-pay

## 2019-04-30 ENCOUNTER — Encounter (HOSPITAL_COMMUNITY): Payer: Self-pay | Admitting: Radiology

## 2019-04-30 DIAGNOSIS — I609 Nontraumatic subarachnoid hemorrhage, unspecified: Secondary | ICD-10-CM

## 2019-04-30 DIAGNOSIS — G9340 Encephalopathy, unspecified: Secondary | ICD-10-CM

## 2019-04-30 LAB — CBC WITH DIFFERENTIAL/PLATELET
Abs Immature Granulocytes: 0.04 10*3/uL (ref 0.00–0.07)
Basophils Absolute: 0 10*3/uL (ref 0.0–0.1)
Basophils Relative: 0 %
Eosinophils Absolute: 0.1 10*3/uL (ref 0.0–0.5)
Eosinophils Relative: 1 %
HCT: 38.4 % (ref 36.0–46.0)
Hemoglobin: 12.2 g/dL (ref 12.0–15.0)
Immature Granulocytes: 0 %
Lymphocytes Relative: 44 %
Lymphs Abs: 4.2 10*3/uL — ABNORMAL HIGH (ref 0.7–4.0)
MCH: 31.8 pg (ref 26.0–34.0)
MCHC: 31.8 g/dL (ref 30.0–36.0)
MCV: 100 fL (ref 80.0–100.0)
Monocytes Absolute: 0.5 10*3/uL (ref 0.1–1.0)
Monocytes Relative: 6 %
Neutro Abs: 4.6 10*3/uL (ref 1.7–7.7)
Neutrophils Relative %: 49 %
Platelets: 145 10*3/uL — ABNORMAL LOW (ref 150–400)
RBC: 3.84 MIL/uL — ABNORMAL LOW (ref 3.87–5.11)
RDW: 14.2 % (ref 11.5–15.5)
WBC: 9.4 10*3/uL (ref 4.0–10.5)
nRBC: 0 % (ref 0.0–0.2)

## 2019-04-30 LAB — URINALYSIS, ROUTINE W REFLEX MICROSCOPIC
Bacteria, UA: NONE SEEN
Bilirubin Urine: NEGATIVE
Glucose, UA: 50 mg/dL — AB
Ketones, ur: 5 mg/dL — AB
Leukocytes,Ua: NEGATIVE
Nitrite: NEGATIVE
Protein, ur: >=300 mg/dL — AB
Specific Gravity, Urine: 1.019 (ref 1.005–1.030)
pH: 5 (ref 5.0–8.0)

## 2019-04-30 LAB — GLUCOSE, CAPILLARY
Glucose-Capillary: 184 mg/dL — ABNORMAL HIGH (ref 70–99)
Glucose-Capillary: 183 mg/dL — ABNORMAL HIGH (ref 70–99)
Glucose-Capillary: 155 mg/dL — ABNORMAL HIGH (ref 70–99)
Glucose-Capillary: 144 mg/dL — ABNORMAL HIGH (ref 70–99)

## 2019-04-30 LAB — BLOOD GAS, ARTERIAL
Acid-base deficit: 18 mmol/L — ABNORMAL HIGH (ref 0.0–2.0)
Acid-base deficit: 5.2 mmol/L — ABNORMAL HIGH (ref 0.0–2.0)
Bicarbonate: 10 mmol/L — ABNORMAL LOW (ref 20.0–28.0)
Bicarbonate: 20.7 mmol/L (ref 20.0–28.0)
FIO2: 100
FIO2: 100
O2 Saturation: 31.7 %
O2 Saturation: 99.9 %
Patient temperature: 37
Patient temperature: 37
pCO2 arterial: 28.9 mmHg — ABNORMAL LOW (ref 32.0–48.0)
pCO2 arterial: 31.8 mmHg — ABNORMAL LOW (ref 32.0–48.0)
pH, Arterial: 7.11 — CL (ref 7.350–7.450)
pH, Arterial: 7.42 (ref 7.350–7.450)
pO2, Arterial: 32.1 mmHg — CL (ref 83.0–108.0)
pO2, Arterial: 451 mmHg — ABNORMAL HIGH (ref 83.0–108.0)

## 2019-04-30 LAB — RAPID URINE DRUG SCREEN, HOSP PERFORMED
Amphetamines: NOT DETECTED
Barbiturates: NOT DETECTED
Benzodiazepines: NOT DETECTED
Cocaine: NOT DETECTED
Opiates: NOT DETECTED
Tetrahydrocannabinol: NOT DETECTED

## 2019-04-30 LAB — LACTIC ACID, PLASMA
Lactic Acid, Venous: 2 mmol/L (ref 0.5–1.9)
Lactic Acid, Venous: 2.4 mmol/L (ref 0.5–1.9)
Lactic Acid, Venous: 3.3 mmol/L (ref 0.5–1.9)
Lactic Acid, Venous: 3.4 mmol/L (ref 0.5–1.9)

## 2019-04-30 LAB — POCT I-STAT 7, (LYTES, BLD GAS, ICA,H+H)
Acid-base deficit: 6 mmol/L — ABNORMAL HIGH (ref 0.0–2.0)
Bicarbonate: 17.3 mmol/L — ABNORMAL LOW (ref 20.0–28.0)
Calcium, Ion: 1.15 mmol/L (ref 1.15–1.40)
HCT: 30 % — ABNORMAL LOW (ref 36.0–46.0)
Hemoglobin: 10.2 g/dL — ABNORMAL LOW (ref 12.0–15.0)
O2 Saturation: 100 %
Patient temperature: 100.7
Potassium: 2.8 mmol/L — ABNORMAL LOW (ref 3.5–5.1)
Sodium: 141 mmol/L (ref 135–145)
TCO2: 18 mmol/L — ABNORMAL LOW (ref 22–32)
pCO2 arterial: 27.8 mmHg — ABNORMAL LOW (ref 32.0–48.0)
pH, Arterial: 7.406 (ref 7.350–7.450)
pO2, Arterial: 198 mmHg — ABNORMAL HIGH (ref 83.0–108.0)

## 2019-04-30 LAB — COMPREHENSIVE METABOLIC PANEL
ALT: 49 U/L — ABNORMAL HIGH (ref 0–44)
AST: 67 U/L — ABNORMAL HIGH (ref 15–41)
Albumin: 4.9 g/dL (ref 3.5–5.0)
Alkaline Phosphatase: 64 U/L (ref 38–126)
Anion gap: 11 (ref 5–15)
BUN: 22 mg/dL (ref 8–23)
CO2: 22 mmol/L (ref 22–32)
Calcium: 9.1 mg/dL (ref 8.9–10.3)
Chloride: 105 mmol/L (ref 98–111)
Creatinine, Ser: 1.12 mg/dL — ABNORMAL HIGH (ref 0.44–1.00)
GFR calc Af Amer: >60 mL/min (ref >60)
GFR calc non Af Amer: 52 mL/min — ABNORMAL LOW (ref >60)
Glucose, Bld: 232 mg/dL — ABNORMAL HIGH (ref 70–99)
Potassium: 3 mmol/L — ABNORMAL LOW (ref 3.5–5.1)
Sodium: 138 mmol/L (ref 135–145)
Total Bilirubin: 1.8 mg/dL — ABNORMAL HIGH (ref 0.3–1.2)
Total Protein: 8.6 g/dL — ABNORMAL HIGH (ref 6.5–8.1)

## 2019-04-30 LAB — PHOSPHORUS
Phosphorus: 3.1 mg/dL (ref 2.5–4.6)
Phosphorus: 2.6 mg/dL (ref 2.5–4.6)

## 2019-04-30 LAB — SARS CORONAVIRUS 2 BY RT PCR (HOSPITAL ORDER, PERFORMED IN ~~LOC~~ HOSPITAL LAB): SARS Coronavirus 2: NEGATIVE

## 2019-04-30 LAB — HEMOGLOBIN A1C
Hgb A1c MFr Bld: 6.6 % — ABNORMAL HIGH (ref 4.8–5.6)
Mean Plasma Glucose: 142.72 mg/dL

## 2019-04-30 LAB — PROTIME-INR
INR: 1.2 (ref 0.8–1.2)
Prothrombin Time: 15.5 seconds — ABNORMAL HIGH (ref 11.4–15.2)

## 2019-04-30 LAB — MAGNESIUM
Magnesium: 1.5 mg/dL — ABNORMAL LOW (ref 1.7–2.4)
Magnesium: 1.6 mg/dL — ABNORMAL LOW (ref 1.7–2.4)

## 2019-04-30 LAB — ETHANOL
Alcohol, Ethyl (B): <10 mg/dL (ref <10)
Alcohol, Ethyl (B): <10 mg/dL (ref <10)

## 2019-04-30 LAB — APTT: aPTT: 24 seconds (ref 24–36)

## 2019-04-30 LAB — MRSA PCR SCREENING: MRSA by PCR: NEGATIVE

## 2019-04-30 MED ORDER — IOHEXOL 350 MG/ML SOLN
50.0000 mL | Freq: Once | INTRAVENOUS | Status: AC | PRN
  Administered 2019-04-30: 50 mL via INTRAVENOUS

## 2019-04-30 MED ORDER — SODIUM CHLORIDE 0.9 % IV BOLUS (SEPSIS)
1000.0000 mL | Freq: Once | INTRAVENOUS | Status: AC
  Administered 2019-04-30: 1000 mL via INTRAVENOUS

## 2019-04-30 MED ORDER — VITAMIN B-1 100 MG PO TABS
100.0000 mg | ORAL_TABLET | Freq: Every day | ORAL | Status: DC
  Filled 2019-04-30: qty 1

## 2019-04-30 MED ORDER — ACETAMINOPHEN 160 MG/5ML PO SOLN
650.0000 mg | Freq: Four times a day (QID) | ORAL | Status: DC | PRN
  Administered 2019-04-30: 650 mg
  Filled 2019-04-30: qty 20.3

## 2019-04-30 MED ORDER — NOREPINEPHRINE 4 MG/250ML-% IV SOLN
0.0000 ug/min | INTRAVENOUS | Status: DC
  Administered 2019-05-01: 10 ug/min via INTRAVENOUS

## 2019-04-30 MED ORDER — NOREPINEPHRINE 4 MG/250ML-% IV SOLN
INTRAVENOUS | Status: AC
  Filled 2019-04-30: qty 250

## 2019-04-30 MED ORDER — THIAMINE HCL 100 MG/ML IJ SOLN
100.0000 mg | Freq: Every day | INTRAMUSCULAR | Status: DC
  Administered 2019-04-30: 100 mg via INTRAVENOUS
  Filled 2019-04-30: qty 2

## 2019-04-30 MED ORDER — ACETAMINOPHEN 500 MG PO TABS
1000.0000 mg | ORAL_TABLET | Freq: Four times a day (QID) | ORAL | Status: DC | PRN
  Administered 2019-05-01 (×2): 1000 mg
  Filled 2019-04-30 (×2): qty 2

## 2019-04-30 MED ORDER — ACETAMINOPHEN 500 MG PO TABS
1000.0000 mg | ORAL_TABLET | Freq: Four times a day (QID) | ORAL | Status: DC | PRN
  Administered 2019-04-30: 17:00:00 1000 mg via ORAL
  Filled 2019-04-30: qty 2

## 2019-04-30 MED ORDER — VANCOMYCIN HCL IN DEXTROSE 750-5 MG/150ML-% IV SOLN
750.0000 mg | INTRAVENOUS | Status: DC
  Administered 2019-05-01 – 2019-05-02 (×2): 750 mg via INTRAVENOUS
  Filled 2019-04-30 (×2): qty 150

## 2019-04-30 MED ORDER — PRO-STAT SUGAR FREE PO LIQD
30.0000 mL | Freq: Two times a day (BID) | ORAL | Status: DC
  Administered 2019-04-30 – 2019-05-01 (×3): 30 mL
  Filled 2019-04-30 (×3): qty 30

## 2019-04-30 MED ORDER — LORAZEPAM 2 MG/ML IJ SOLN: 0.0000 mg | Freq: Two times a day (BID) | INTRAMUSCULAR | Status: DC

## 2019-04-30 MED ORDER — SODIUM CHLORIDE 0.9 % IV SOLN
2.0000 g | Freq: Once | INTRAVENOUS | Status: AC
  Administered 2019-04-30: 2 g via INTRAVENOUS
  Filled 2019-04-30: qty 2

## 2019-04-30 MED ORDER — VITAL HIGH PROTEIN PO LIQD
1000.0000 mL | ORAL | Status: DC
  Administered 2019-04-30: 1000 mL

## 2019-04-30 MED ORDER — THIAMINE HCL 100 MG/ML IJ SOLN
100.0000 mg | Freq: Every day | INTRAMUSCULAR | Status: DC
  Administered 2019-05-01 – 2019-05-02 (×2): 100 mg via INTRAVENOUS
  Filled 2019-04-30 (×2): qty 2

## 2019-04-30 MED ORDER — LORAZEPAM 2 MG/ML IJ SOLN
1.0000 mg | Freq: Four times a day (QID) | INTRAMUSCULAR | Status: DC | PRN
  Administered 2019-04-30: 1 mg via INTRAVENOUS
  Filled 2019-04-30: qty 1

## 2019-04-30 MED ORDER — FOLIC ACID 1 MG PO TABS
1.0000 mg | ORAL_TABLET | Freq: Every day | ORAL | Status: DC
  Administered 2019-05-01: 1 mg
  Filled 2019-04-30 (×2): qty 1

## 2019-04-30 MED ORDER — SODIUM CHLORIDE 0.9 % IV BOLUS
1000.0000 mL | Freq: Once | INTRAVENOUS | Status: AC
  Administered 2019-04-30: 1000 mL via INTRAVENOUS

## 2019-04-30 MED ORDER — PANTOPRAZOLE SODIUM 40 MG PO PACK
40.0000 mg | PACK | Freq: Every day | ORAL | Status: DC
  Administered 2019-04-30 – 2019-05-01 (×2): 40 mg
  Filled 2019-04-30 (×3): qty 20

## 2019-04-30 MED ORDER — INSULIN ASPART 100 UNIT/ML ~~LOC~~ SOLN: 0.0000 [IU] | SUBCUTANEOUS | Status: DC | PRN

## 2019-04-30 MED ORDER — SODIUM CHLORIDE 0.9 % IV SOLN
INTRAVENOUS | Status: DC
  Administered 2019-04-30: 11:00:00 via INTRAVENOUS

## 2019-04-30 MED ORDER — PROPOFOL 1000 MG/100ML IV EMUL
5.0000 ug/kg/min | INTRAVENOUS | Status: DC
  Administered 2019-04-30: 50 ug/kg/min via INTRAVENOUS
  Administered 2019-04-30: 08:00:00 60 ug/kg/min via INTRAVENOUS
  Filled 2019-04-30: qty 100
  Filled 2019-04-30: qty 200
  Filled 2019-04-30 (×5): qty 100

## 2019-04-30 MED ORDER — FOLIC ACID 1 MG PO TABS
1.0000 mg | ORAL_TABLET | Freq: Every day | ORAL | Status: DC
  Administered 2019-04-30: 1 mg via ORAL
  Filled 2019-04-30: qty 1

## 2019-04-30 MED ORDER — LABETALOL HCL 5 MG/ML IV SOLN
20.0000 mg | Freq: Once | INTRAVENOUS | Status: AC
  Administered 2019-04-30: 20 mg via INTRAVENOUS
  Filled 2019-04-30: qty 4

## 2019-04-30 MED ORDER — ADULT MULTIVITAMIN LIQUID CH
15.0000 mL | Freq: Every day | ORAL | Status: DC
  Administered 2019-05-01: 15 mL
  Filled 2019-04-30 (×2): qty 15

## 2019-04-30 MED ORDER — SODIUM CHLORIDE 0.9 % IV BOLUS (SEPSIS)
250.0000 mL | Freq: Once | INTRAVENOUS | Status: AC
  Administered 2019-04-30: 250 mL via INTRAVENOUS

## 2019-04-30 MED ORDER — INSULIN ASPART 100 UNIT/ML ~~LOC~~ SOLN
0.0000 [IU] | SUBCUTANEOUS | Status: DC
  Administered 2019-04-30 – 2019-05-01 (×4): 2 [IU] via SUBCUTANEOUS
  Administered 2019-05-01: 1 [IU] via SUBCUTANEOUS

## 2019-04-30 MED ORDER — SODIUM CHLORIDE 0.9 % IV BOLUS (SEPSIS)
500.0000 mL | Freq: Once | INTRAVENOUS | Status: AC
  Administered 2019-04-30: 500 mL via INTRAVENOUS

## 2019-04-30 MED ORDER — NIMODIPINE 6 MG/ML PO SOLN
60.0000 mg | ORAL | Status: DC
  Administered 2019-04-30 (×2): 60 mg
  Filled 2019-04-30 (×2): qty 10

## 2019-04-30 MED ORDER — VANCOMYCIN HCL IN DEXTROSE 1-5 GM/200ML-% IV SOLN
1000.0000 mg | Freq: Once | INTRAVENOUS | Status: DC
  Filled 2019-04-30: qty 200

## 2019-04-30 MED ORDER — LORAZEPAM 2 MG/ML IJ SOLN
2.0000 mg | Freq: Once | INTRAMUSCULAR | Status: DC
  Filled 2019-04-30 (×2): qty 1

## 2019-04-30 MED ORDER — NICARDIPINE HCL IN NACL 20-0.86 MG/200ML-% IV SOLN
0.0000 mg/h | INTRAVENOUS | Status: DC
  Administered 2019-04-30: 5 mg/h via INTRAVENOUS
  Filled 2019-04-30 (×2): qty 200

## 2019-04-30 MED ORDER — POTASSIUM CHLORIDE 20 MEQ/15ML (10%) PO SOLN
40.0000 meq | ORAL | Status: DC
  Administered 2019-04-30: 40 meq via ORAL
  Filled 2019-04-30: qty 30

## 2019-04-30 MED ORDER — SODIUM CHLORIDE 0.9 % IV SOLN
INTRAVENOUS | Status: DC
  Administered 2019-04-30 – 2019-05-02 (×4): via INTRAVENOUS

## 2019-04-30 MED ORDER — SODIUM CHLORIDE 0.9 % IV SOLN
2.0000 g | Freq: Two times a day (BID) | INTRAVENOUS | Status: DC
  Administered 2019-04-30 – 2019-05-01 (×3): 2 g via INTRAVENOUS
  Filled 2019-04-30 (×5): qty 2

## 2019-04-30 MED ORDER — POTASSIUM CHLORIDE 20 MEQ/15ML (10%) PO SOLN
40.0000 meq | ORAL | Status: AC
  Administered 2019-04-30: 40 meq
  Filled 2019-04-30: qty 30

## 2019-04-30 MED ORDER — LEVETIRACETAM IN NACL 1000 MG/100ML IV SOLN
1000.0000 mg | Freq: Two times a day (BID) | INTRAVENOUS | Status: DC
  Administered 2019-04-30 – 2019-05-06 (×13): 1000 mg via INTRAVENOUS
  Filled 2019-04-30 (×14): qty 100

## 2019-04-30 MED ORDER — METRONIDAZOLE IN NACL 5-0.79 MG/ML-% IV SOLN
500.0000 mg | Freq: Once | INTRAVENOUS | Status: AC
  Administered 2019-04-30: 500 mg via INTRAVENOUS
  Filled 2019-04-30: qty 100

## 2019-04-30 MED ORDER — ACETAMINOPHEN 650 MG RE SUPP
650.0000 mg | Freq: Once | RECTAL | Status: AC
  Administered 2019-04-30: 650 mg via RECTAL
  Filled 2019-04-30: qty 1

## 2019-04-30 MED ORDER — ORAL CARE MOUTH RINSE
15.0000 mL | OROMUCOSAL | Status: DC
  Administered 2019-04-30 – 2019-05-01 (×10): 15 mL via OROMUCOSAL

## 2019-04-30 MED ORDER — SODIUM CHLORIDE 0.9 % IV SOLN: INTRAVENOUS | Status: DC

## 2019-04-30 MED ORDER — CHLORHEXIDINE GLUCONATE CLOTH 2 % EX PADS
6.0000 | MEDICATED_PAD | Freq: Every day | CUTANEOUS | Status: DC
  Administered 2019-04-30 – 2019-05-06 (×7): 6 via TOPICAL

## 2019-04-30 MED ORDER — ADULT MULTIVITAMIN W/MINERALS CH
1.0000 | ORAL_TABLET | Freq: Every day | ORAL | Status: DC
  Administered 2019-04-30: 1 via ORAL
  Filled 2019-04-30: qty 1

## 2019-04-30 MED ORDER — CHLORHEXIDINE GLUCONATE 0.12% ORAL RINSE (MEDLINE KIT)
15.0000 mL | Freq: Two times a day (BID) | OROMUCOSAL | Status: DC
  Administered 2019-04-30 – 2019-05-01 (×3): 15 mL via OROMUCOSAL

## 2019-04-30 MED ORDER — LORAZEPAM 1 MG PO TABS: 1.0000 mg | ORAL_TABLET | Freq: Four times a day (QID) | ORAL | Status: DC | PRN

## 2019-04-30 MED ORDER — LORAZEPAM 2 MG/ML IJ SOLN
0.0000 mg | Freq: Four times a day (QID) | INTRAMUSCULAR | Status: DC
  Administered 2019-04-30 (×2): 2 mg via INTRAVENOUS
  Filled 2019-04-30 (×2): qty 1

## 2019-04-30 MED ORDER — NIMODIPINE 6 MG/ML PO SOLN
60.0000 mg | ORAL | Status: DC
  Administered 2019-04-30 – 2019-05-01 (×6): 60 mg
  Filled 2019-04-30 (×4): qty 10

## 2019-04-30 NOTE — Progress Notes (Signed)
EEG complete - results pending 

## 2019-04-30 NOTE — Progress Notes (Signed)
NAME:  Shannon Herrera, MRN:  [id number], DOB:  1956-07-07, LOS: 0 ADMISSION DATE:  04/29/2019, CONSULTATION DATE:  04/30/19  CHIEF COMPLAINT:  SAH  Brief History   63 yo with hx of HTN, NIDDM, Hypothyroidism, and chornic etoh abuse presents to outside ED with possible seiure activity per family.   History of present illness   63 yo with hx of HTN, NIDDM, Hypothyroidism, and chornic etoh abuse presents to outside ED with possible seiure activity per family. Was noted to be  on ground and unresponsive per family. In Ed noted to have large hematoma and gaze deviation. Was intubated at outside Ed. In addition to this Ct head notable for [facility]. Labs notable for mild resp alkalosis of 7.42/28.9/451. Given SAH patient transferred to [facility] for further wokrup and management per neurosurgery.   Past Medical History  HTN NIDDM Hypothyroidism chornic etoh abuse  Significant Hospital Events   Patient intubated on 7/18 Patient transferred to Neurosurgery on 7/19  Consults:  Neurology Neurosurgery PCCM  Procedures:  Intubation   Significant Diagnostic Tests:  CT head on 7/18   IMPRESSION: 1. Extensive intracranial hemorrhage as described, likely traumatic. The possibility of an aneurysm is not entirely excluded and cannot be evaluated on this noncontrast CT. No significant mass effect. No midline shift. Close follow-up recommended. 2. No acute/traumatic cervical spine pathology. 3. Large left temporal and parietal scalp hematoma.  Micro Data:  BCX UCX Covid negative  Antimicrobials:  Per primary team  Interim history/subjective:  No changes overnight  Objective   Blood pressure (!) 145/95, pulse (!) 112, temperature (!) 100.4 F (38 C), resp. rate 18, height 5\' 3"  (1.6 m), weight 54.4 kg, SpO2 100 %.    Vent Mode: PRVC FiO2 (%):  [35 %-100 %] 40 % Set Rate:  [18 bmp] 18 bmp Vt Set:  [420 mL-500 mL] 420 mL PEEP:  [5 cmH20] 5 cmH20 Plateau Pressure:  [13 cmH20-17  cmH20] 13 cmH20   Intake/Output Summary (Last 24 hours) at 04/30/2019 1540 Last data filed at 04/30/2019 0348 Gross per 24 hour  Intake 200 ml  Output 20 ml  Net 180 ml   Filed Weights   04/29/19 2353  Weight: 54.4 kg    Examination: Intubated and sedated Endotracheal tube in place Clear breath sounds bilaterally S1-S2 appreciated Abdomen is soft, nontender Extremities without deformity Sedated pupils equal reactive  Chest x-ray reviewed by myself showing no acute infiltrate  Resolved Hospital Problem list   NA  Assessment & Plan:   PCCM following for ventilator management  Subarachnoid hemorrhage History of seizures on Keppra -Interventions per neurosurgery -Propofol for sedation -Fentanyl PRN for analgesia -On Keppra  History of hypertension -Goal BP as determined by neurology  Respiratory sufficiency -Continue PRVC ventilation -Spontaneous breathing trials -Continue aspiration precautions    GI- -start tube feeding -GI prophylaxis with Protonix    ID-Patient with no obvious source of infection. Patient is febrile. No leukocytosis.  -Follow cultures -Continue broad-spectrum antibiotic  Best practice:  Diet: Start tube feed Pain/Anxiety/Delirium protocol (if indicated): Propfol and fentanyl VAP protocol (if indicated): HOB at 30 DVT prophylaxis: Contraindicated for now GI prophylaxis: Pepcid Glucose control: Every 4 hours with correctional Mobility: Bedrest Code Status: Full per primary Family Communication: Per primary Disposition: ICU Labs   CBC: Recent Labs  Lab 04/29/19 2347 04/30/19 0721  WBC 9.4  --   NEUTROABS 4.6  --   HGB 12.2 10.2*  HCT 38.4 30.0*  MCV 100.0  --  PLT 145*  --     Basic Metabolic Panel: Recent Labs  Lab 04/29/19 2347 04/30/19 0721  NA 138 141  K 3.0* 2.8*  CL 105  --   CO2 22  --   GLUCOSE 232*  --   BUN 22  --   CREATININE 1.12*  --   CALCIUM 9.1  --    GFR: Estimated Creatinine Clearance: 42  mL/min (A) (by C-G formula based on SCr of 1.12 mg/dL (H)). Recent Labs  Lab 04/29/19 2347 04/30/19 0215  WBC 9.4  --   LATICACIDVEN 2.0* 2.4*    Liver Function Tests: Recent Labs  Lab 04/29/19 2347  AST 67*  ALT 49*  ALKPHOS 64  BILITOT 1.8*  PROT 8.6*  ALBUMIN 4.9   No results for input(s): LIPASE, AMYLASE in the last 168 hours. No results for input(s): AMMONIA in the last 168 hours.  ABG    Component Value Date/Time   PHART 7.406 04/30/2019 0721   PCO2ART 27.8 (L) 04/30/2019 0721   PO2ART 198.0 (H) 04/30/2019 0721   HCO3 17.3 (L) 04/30/2019 0721   TCO2 18 (L) 04/30/2019 0721   ACIDBASEDEF 6.0 (H) 04/30/2019 0721   O2SAT 100.0 04/30/2019 0721     Coagulation Profile: Recent Labs  Lab 04/29/19 2347  INR 1.2    Cardiac Enzymes: No results for input(s): CKTOTAL, CKMB, CKMBINDEX, TROPONINI in the last 168 hours.  HbA1C: Hgb A1c MFr Bld  Date/Time Value Ref Range Status  02/05/2019 11:33 PM 11.1 (H) 4.8 - 5.6 % Final    Comment:    (NOTE) Pre diabetes:          5.7%-6.4% Diabetes:              >6.4% Glycemic control for   <7.0% adults with diabetes   07/04/2018 03:30 PM 8.6 (H) 4.8 - 5.6 % Final    Comment:    (NOTE)         Prediabetes: 5.7 - 6.4         Diabetes: >6.4         Glycemic control for adults with diabetes: <7.0     CBG: No results for input(s): GLUCAP in the last 168 hours.  Review of Systems:   Pertinent positives and negatives per HPI  Past Medical History  She,  has a past medical history of Diabetes mellitus without complication (HCC), Hypertension, and Hypothyroid.   Surgical History    Past Surgical History:  Procedure Laterality Date  . denies       Social History   reports that she has never smoked. She has never used smokeless tobacco. She reports current alcohol use. She reports that she does not use drugs.   Family History   Her family history includes Hypertension in her mother.   Allergies No Known  Allergies

## 2019-04-30 NOTE — Procedures (Signed)
Central Venous Catheter Insertion Procedure Note KAMALJIT HIZER [id number] Feb 18, 1956  Procedure: Insertion of Central Venous Catheter Indications: Drug and/or fluid administration  Procedure Details Consent: Risks of procedure as well as the alternatives and risks of each were explained to the (patient/caregiver).  Consent for procedure obtained. Time Out: Verified patient identification, verified procedure, site/side was marked, verified correct patient position, special equipment/implants available, medications/allergies/relevent history reviewed, required imaging and test results available.  Performed  Maximum sterile technique was used including antiseptics, cap, gloves, gown, hand hygiene, mask and sheet. Skin prep: Chlorhexidine; local anesthetic administered  Attempt was made in the left IJ-vein was cannulated, was unable to dilate the access without using excessive force, attempt was discontinued Pressure was held at the site for 5 minutes I scanned the lung with ultrasound-no evidence of pneumothorax.  Skin prep: Chlorhexidine; local anesthetic administered A antimicrobial bonded/coated triple lumen catheter was placed in the right internal jugular vein using the Seldinger technique.   Evaluation Blood flow good Complications: No apparent complications Patient did tolerate procedure well. Chest X-ray ordered to verify placement.  CXR: pending.  Feleica Fulmore A Rosslyn Pasion 04/30/2019, 7:13 PM

## 2019-04-30 NOTE — ED Notes (Signed)
Critical results called from blood gas, Dr Wyvonnia Dusky and resp advised, ? Venous sample, abd to be redrawn,

## 2019-04-30 NOTE — Progress Notes (Signed)
Updated spouse Obtained consent for central line

## 2019-04-30 NOTE — Progress Notes (Signed)
Unable to decreased patient's temperature with tylenol. Will apply a cooling blanket.

## 2019-04-30 NOTE — Procedures (Signed)
History: 63 yo F with SAH, being evaluated for seizures  Sedation: Propofol 50 mcg/kg/min  Technique: This is a 21 channel STAT scalp EEG performed at the bedside with bipolar and monopolar montages arranged in accordance to the international 10/20 system of electrode placement. One channel was dedicated to EKG recording.    Background: The background consists predominantly of 14 - 15 Hz bifrontally predominant smoothly contoured beta range activity. There is also some admixed generalized irregular delta activity. Occasionally, sleep spindles are seen as well. No epileptiform features were noted.   Photic stimulation: Physiologic driving is not performed.  EEG Abnormalities: 1) Absent PDR 2) generalized slow activity 3) excess beta activity  Clinical Interpretation: This EEG is consistent with the patient's sedated state. There was no seizure or seizure predisposition recorded on this study. Please note that lack of epileptiform activity on EEG does not preclude the possibility of epilepsy.   Roland Rack, MD [facility] [phone]  If 7pm- 7am, please page neurology on call as listed in [place].

## 2019-04-30 NOTE — Progress Notes (Signed)
Update given to patient's husband, Shannon Herrera via telephone. Shannon Herrera request attending provider call him with an update today. I will pass this information on.

## 2019-04-30 NOTE — Consult Note (Addendum)
NEUROLOGY CONSULT  Reason for Consult:neurologic opinion for [facility] Referring Physician: Dr Orpah Melter  CC: unable; intubated  HPI: Shannon Herrera is an 63 y.o. female with PMH of HTN, HLD, seziures, DKA, ongoing EtOH abuse brought to [facility] ER by EMS after being found down, unresponsive and with seizure activity. She appeared to have fell into the position that she was found down in, noting left side of head trauma, significant soft tissue swelling. Pt became agitated in [facility] ED, given ativan and then intubated, now vented on propofol. Emergent CTH was done, witch showed extensive SAH, bilaterally, mostly in left frontotemporal lobe in area of trauma, but is also seen in the right frontal and parietal convexity as well as large amount layered into the cistern and right tentorium at the right cerebellopontine angle. There is also IVH extention into 4th ventricle. Teleneurology was consulted at [facility] and transfer was recommended for NSGY consult and further Neuro ICU care. Pt was then [facility].Marland Kitchen Keppra 1069m and Ativan was given for seizure control at outside ER.   Past Medical History Past Medical History:  Diagnosis Date  . [place]   . Hypertension   . Hypothyroid     Past Surgical History Past Surgical History:  Procedure Laterality Date  . denies      Family History Family History  Problem Relation Age of Onset  . Hypertension Mother     Social History    reports that she has never smoked. She has never used smokeless tobacco. She reports current alcohol use. She reports that she does not use drugs.  Allergies No Known Allergies  Home Medications Medications Prior to Admission  Medication Sig Dispense Refill  . folic acid (FOLVITE) 1 MG tablet Take 1 tablet (1 mg total) by mouth daily. 30 tablet 0  . glucose monitoring kit (FREESTYLE) monitoring kit 1 each by Does not apply route 4 (four) times daily - after meals and at bedtime.  1 month Diabetic Testing Supplies for QAC-QHS accuchecks. 1 each 1  . insulin aspart protamine- aspart (NOVOLOG MIX 70/30) (70-30) 100 UNIT/ML injection Inject 0.12 mLs (12 Units total) into the skin 2 (two) times daily with a meal. 10 mL 11  . Insulin Pen Needle (TECHLITE PEN NEEDLES) 31G X 8 MM MISC 1 each by Does not apply route 2 (two) times a day. 60 each 0  . levETIRAcetam (KEPPRA) 750 MG tablet Take 1 tablet (750 mg total) by mouth 2 (two) times daily. 60 tablet 0  . levothyroxine (SYNTHROID, LEVOTHROID) 100 MCG tablet Take 1 tablet (100 mcg total) by mouth daily before breakfast. 30 tablet 2  . metoprolol tartrate (LOPRESSOR) 50 MG tablet Take 1 tablet (50 mg total) by mouth 2 (two) times daily. 60 tablet 2  . thiamine (VITAMIN B-1) 100 MG tablet Take 1 tablet (100 mg total) by mouth daily. 30 tablet 0    Hospital Medications . Chlorhexidine Gluconate Cloth  6 each Topical Daily  . folic acid  1 mg Oral Daily  . LORazepam  0-4 mg Intravenous Q6H   Followed by  . [START ON 05/02/2019] LORazepam  0-4 mg Intravenous Q12H  . LORazepam  2 mg Intravenous Once  . multivitamin with minerals  1 tablet Oral Daily  . thiamine  100 mg Oral Daily   Or  . thiamine  100 mg Intravenous Daily     RHCW:CBJSEGd/t clinical status; coma; intubated  Physical Examination:  Vitals:   04/30/19  0715 04/30/19 0720 04/30/19 0800 04/30/19 0816  BP: (!) 122/107 (!) 142/95 (!) 145/95   Pulse: (!) 111 (!) 112    Resp: 18 18    Temp: (!) 100.6 F (38.1 C) (!) 100.6 F (38.1 C) (!) 100.4 F (38 C)   TempSrc:      SpO2: 100% 100%  100%  Weight:      Height:        General - frail, elderly, older than stated age appearing HEET- traumatic looking injury with extensive swelling, ecchymosis seen on left side of head/face. Some left eye trauma noted as well  Heart - Regular rate and rhythm - no murmer Lungs - Clear to auscultation Abdomen - Soft - non tender Extremities - Distal pulses intact - no  edema Skin - Warm and dry  Neurologic Examination:   Mental Status:  With propofol held: She begins moving all extremities wildly in bed. No eye opening and did not follow commands. She did have appropriate localization to noxious stimuli. PERRL 32m, eye roving upon passive eye opening, did not track. Face seems symmetric around ETT.  GCS: E-1, V-1, M-5 =7t   LABORATORY STUDIES:  Basic Metabolic Panel: Recent Labs  Lab 04/29/19 2347 04/30/19 0721  NA 138 141  K 3.0* 2.8*  CL 105  --   CO2 22  --   GLUCOSE 232*  --   BUN 22  --   CREATININE 1.12*  --   CALCIUM 9.1  --     Liver Function Tests: Recent Labs  Lab 04/29/19 2347  AST 67*  ALT 49*  ALKPHOS 64  BILITOT 1.8*  PROT 8.6*  ALBUMIN 4.9   CBC: Recent Labs  Lab 04/29/19 2347 04/30/19 0721  WBC 9.4  --   NEUTROABS 4.6  --   HGB 12.2 10.2*  HCT 38.4 30.0*  MCV 100.0  --   PLT 145*  --     Coagulation Studies: Recent Labs    04/29/19 2347  LABPROT 15.5*  INR 1.2    Urinalysis:  Recent Labs  Lab 04/30/19 0033  COLORURINE YELLOW  LABSPEC 1.019  PHURINE 5.0  GLUCOSEU 50*  HGBUR SMALL*  BILIRUBINUR NEGATIVE  KETONESUR 5*  PROTEINUR >=300*  NITRITE NEGATIVE  LEUKOCYTESUR NEGATIVE   HgbA1C:  Lab Results  Component Value Date   HGBA1C 11.1 (H) 02/05/2019    Urine Drug Screen:      Component Value Date/Time   LABOPIA NONE DETECTED 04/30/2019 0030   COCAINSCRNUR NONE DETECTED 04/30/2019 0030   COCAINSCRNUR NONE DETECTED 07/03/2018 1613   LABBENZ NONE DETECTED 04/30/2019 0030   AMPHETMU NONE DETECTED 04/30/2019 0030   THCU NONE DETECTED 04/30/2019 0030   LABBARB NONE DETECTED 04/30/2019 0030     Alcohol Level:  Recent Labs  Lab 04/29/19 2347 04/29/19 2349  ETH <10 <10    Miscellaneous labs:  EKG  EKG  IMAGING: Ct Head Wo Contrast  Result Date: 04/30/2019 CLINICAL DATA:  63year old female with seizure. Code stroke. EXAM: CT HEAD WITHOUT CONTRAST CT CERVICAL SPINE  WITHOUT CONTRAST TECHNIQUE: Multidetector CT imaging of the head and cervical spine was performed following the standard protocol without intravenous contrast. Multiplanar CT image reconstructions of the cervical spine were also generated. COMPARISON:  Head CT dated 02/05/2019 FINDINGS: CT HEAD FINDINGS Brain: There is mild age-related atrophy and moderate chronic microvascular ischemic changes. Moderate amount of subarachnoid hemorrhage in the left frontotemporal lobe extending into the left sylvian fissure and left central sulcus. Smaller amount  of subarachnoid hemorrhage noted in the right frontal and parietal convexity. There is a large amount of blood in the quadrigeminal plate cistern and layering inferior to the right tentorium and along the right cerebellopontine angle. There is extension of the blood into the superior portion of the fourth ventricle. There is subarachnoid hemorrhage in the superior aspect of the right cerebellar hemisphere. No ventriculomegaly. No significant mass effect. No midline shift. Vascular: Grossly unremarkable on suboptimal visualization. Skull: Normal. Negative for fracture or focal lesion. Sinuses/Orbits: There is mild diffuse mucoperiosteal thickening of paranasal sinuses with air-fluid level in the right maxillary sinus and posterior nasal passages and nasopharynx. The mastoid air cells are clear. Other: A support tube is partially visualized. There is a large left temporal and parietal scalp hematoma. CT CERVICAL SPINE FINDINGS Alignment: No acute subluxation. Skull base and vertebrae: No acute fracture. Osteopenia. Soft tissues and spinal canal: No prevertebral fluid or swelling. No visible canal hematoma. Disc levels: Multilevel degenerative changes most prominent and C5-C6 and C6-C7. Upper chest: Negative. Other: An endotracheal and an enteric tube partially visualized. IMPRESSION: 1. Extensive intracranial hemorrhage as described, likely traumatic. The possibility of an  aneurysm is not entirely excluded and cannot be evaluated on this noncontrast CT. No significant mass effect. No midline shift. Close follow-up recommended. 2. No acute/traumatic cervical spine pathology. 3. Large left temporal and parietal scalp hematoma. These results were called by telephone at the time of interpretation on 04/30/2019 at 12:15 am to Dr. Ezequiel Essex , who verbally acknowledged these results. Electronically Signed   By: Anner Crete M.D.   On: 04/30/2019 00:17   Ct Cervical Spine Wo Contrast  Result Date: 04/30/2019 CLINICAL DATA:  63 year old female with seizure. Code stroke. EXAM: CT HEAD WITHOUT CONTRAST CT CERVICAL SPINE WITHOUT CONTRAST TECHNIQUE: Multidetector CT imaging of the head and cervical spine was performed following the standard protocol without intravenous contrast. Multiplanar CT image reconstructions of the cervical spine were also generated. COMPARISON:  Head CT dated 02/05/2019 FINDINGS: CT HEAD FINDINGS Brain: There is mild age-related atrophy and moderate chronic microvascular ischemic changes. Moderate amount of subarachnoid hemorrhage in the left frontotemporal lobe extending into the left sylvian fissure and left central sulcus. Smaller amount of subarachnoid hemorrhage noted in the right frontal and parietal convexity. There is a large amount of blood in the quadrigeminal plate cistern and layering inferior to the right tentorium and along the right cerebellopontine angle. There is extension of the blood into the superior portion of the fourth ventricle. There is subarachnoid hemorrhage in the superior aspect of the right cerebellar hemisphere. No ventriculomegaly. No significant mass effect. No midline shift. Vascular: Grossly unremarkable on suboptimal visualization. Skull: Normal. Negative for fracture or focal lesion. Sinuses/Orbits: There is mild diffuse mucoperiosteal thickening of paranasal sinuses with air-fluid level in the right maxillary sinus and  posterior nasal passages and nasopharynx. The mastoid air cells are clear. Other: A support tube is partially visualized. There is a large left temporal and parietal scalp hematoma. CT CERVICAL SPINE FINDINGS Alignment: No acute subluxation. Skull base and vertebrae: No acute fracture. Osteopenia. Soft tissues and spinal canal: No prevertebral fluid or swelling. No visible canal hematoma. Disc levels: Multilevel degenerative changes most prominent and C5-C6 and C6-C7. Upper chest: Negative. Other: An endotracheal and an enteric tube partially visualized. IMPRESSION: 1. Extensive intracranial hemorrhage as described, likely traumatic. The possibility of an aneurysm is not entirely excluded and cannot be evaluated on this noncontrast CT. No significant mass effect. No midline  shift. Close follow-up recommended. 2. No acute/traumatic cervical spine pathology. 3. Large left temporal and parietal scalp hematoma. These results were called by telephone at the time of interpretation on 04/30/2019 at 12:15 am to Dr. Ezequiel Essex , who verbally acknowledged these results. Electronically Signed   By: Anner Crete M.D.   On: 04/30/2019 00:17   Dg Chest Portable 1 View  Result Date: 04/30/2019 CLINICAL DATA:  63 year old female status post intubation. EXAM: PORTABLE CHEST 1 VIEW COMPARISON:  Chest radiograph dated 02/05/2019 FINDINGS: Endotracheal tube with tip approximately 4 cm above the carina. An enteric tube extends into the left hemiabdomen with tip beyond the inferior margin of the image. Partially visualized indeterminate linear radiopaque structure over the base of the neck on the right. The lungs are clear. There is no pleural effusion or pneumothorax. The cardiac silhouette is within normal limits. No acute osseous pathology. IMPRESSION: 1. No acute cardiopulmonary process. 2. Endotracheal tube above the carina. Electronically Signed   By: Anner Crete M.D.   On: 04/30/2019 00:26   EEG with no seizure  or lateralizing features  Assessment/Plan: This is a 63yrold with chronic ETOH, HTN, hypothyroid, seizures presented to [facility] ER after being found down and unresponsive. It is appears that she fell on left side of head as there is significant trauma. CTH showed extensive SAH bilaterally, mostly in left frontotemporal lobe in area of trauma, but is also seen in the right frontal and parietal convexity as well as large amount layered into the cistern and right tentorium at the right cerebellopontine angle. There is also IVH extention into 4th ventricle.    # Unresponsive state- it is unclear which happened first as sequence of events were not witnessed and pt was found down. Did she seize and fall, thus [facility] traumatic? Did she have SLongfellowand this caused fall/seizure?  Phone conversation later on with Dr. cKristine Garbehusband reports that she has extensive alcohol abuse history and has had many falls in the past. # SAH- extensive throughout multiple regions and into 4th ventricle. Appears to be traumatic etiology given the visual head trauma, but can't r/o other underlying causes. NSG following  -No hydrocephalus at this time  -CTA head/neck did not show aneurysm or AVM at this time  -Na 141 # Seizure disorder- pt has history of prev seizures, on 7578mBID Keppra at home. Unable to obtain compliance. # Chronic and ongoing ETOH abuse- watch for withdrawal. This can confound neuro exam and progress # HTN- keep SBP <160. Cardene gtt if needed # Acute respiratory failure- d/t above. intubated, vented. CCM following   RECS: Keppra 100055mV BID Nimodipine 61m7mr tube Q4 May need cerebral angiogram to further evaluate- discussed with Dr. CramDennie Maizesrosurgery Can check TCD  No anticoagulation/no chemo dvt ppt, SCD only IV NS @ 75cc/h maintain evolumemia Strict I&Os Monitor Na, keep >140 SBP goal <160 Nicardipine if needed to maintain BP at goal Close Neruo ck Rehab consults when able CIWA  plan High dose Thiamine  Attending neurologist's note to follow  Desiree Metzger-Cihelka, ARNP-C, ANVP-BC Pager: [phone] Neurohospitalist Addendum Patient seen and examined with APP/Resident. Agree with the history and physical as documented above. Agree with the plan as documented, which I helped formulate. I have independently reviewed the chart, obtained history, review of systems and examined the patient.I have personally reviewed pertinent head/neck/spine imaging (CT/MRI).  Neurology will be available as needed. Please feel free to call with any questions. --- AshiAmie Portland  MD [facility] Pager: [phone]  If 7pm to 7am, please call on call as listed on [place].

## 2019-04-30 NOTE — ED Notes (Signed)
Dr Wyvonnia Dusky notified of temp, additional orders given,

## 2019-04-30 NOTE — Progress Notes (Signed)
Met [facility] transport team in CT Room 2 to obtain STAT CTA of head and neck. Patient needed better IV for contrast. Myself and [facility] RN Estill Bamberg) attempted several times. L AC IV was obtained but blew. Patient then transported back to ICU for IV team attempt. Patient arrival to ICU at 0600 with patient belongings bag and paper work from [employer] .

## 2019-04-30 NOTE — Consult Note (Signed)
[facility] Tele[facility]rology Consult Services   Date of Service:   04/29/2019 23:52:27  Impression:     .  subarachnoid hemorrhage  Comments/Sign-Out: 63yo W w/PMH of HTN, HLD, seziures, DKA, EtOH abuse brought in by EMS with seizure activity. Also likely fall with head trauma given soft tissue swelling. Pt was agitated in ED, given ativan and then intubated, now on propofol. Left gaze preference noted. Pt sedated, unresponsive, unable to follow commands. NIHSS 34, limited by propofol, left gaze. CTH shows subarachnoid hemorrhage. No tpa due to bleed. Neurosurgery eval advised for [facility].  Metrics: Last Known Well: Unknown [facility] Notification Time: 04/29/2019 23:52:27 Arrival Time: 04/30/2019 23:25:00 Stamp Time: 04/29/2019 23:52:27 Time First Login Attempt: 04/30/2019 00:00:22 Video Start Time: 04/30/2019 00:00:22  Symptoms: seizure NIHSS Start Assessment Time: 04/30/2019 00:04:09 Patient is not a candidate for tPA. Patient was not deemed candidate for tPA thrombolytics because of Current or Previous ICH. Video End Time: 04/30/2019 00:17:47  CT head was reviewed and results were: subarachnoid hemorrhage  Clinical Presentation is not Suggestive of Large Vessel Occlusive Disease  ED Physician notified of diagnostic impression and management plan on 04/30/2019 00:20:20  Our recommendations are outlined below.  Recommendations:     .  -Follow up Neurosurgery recommendations     .  -Nimodipine 60mg  every 4 hours for 21 days     .  -Atorvastatin 80mg  po asap, and then start daily     .  -Anticoagulant reversal if needed     .  -Avoid hypotonic solutions, maintain euvolemia with normal saline or hypertonic saline if indicated     .  -Maintain SBP goal of 120-160, MAP >90, monitoring with A-line recommended     .  -Continuous infusion of IV nicardipine 5-15 mg/h or labetalol 5-20 mg/h preferable to bolus or po antihypertensives for blood pressure control      .  -Maintain Na >140     .  -GI prophylaxis     .  -DVT Prophylaxis with intermittent pneumatic compression     .  -Frequent Neuro Checks     .  -Temperature control (Tylenol prn for temp >100.4)     .  -Optimize glucose control, goal 80-200     .  -Monitor for associated complications including SIRS, arrhythmias, cardiomyopathy, seizures, neuroendocrine disorders, hydrocephalus, cerebral edema, delayed cerebral ischemia     .  -Avoid anticoagulation and antiplatelet medication     .  -MRI Brain w/o contrast when stable     .  -CTA Head and Neck w/contrast asap, DSA is preferred if available     .  -Repeat CTH at 24 hours if there is delay in obtaining MRI     .  -STAT CTH for any acute changes in mental status or exam     .  -EEG     .  -Keppra 1g q12h     .  -TTE     .  -NPO until cleared by Speech/Swallow evaluation     .  -PT/OT evaluation when stable     .  -Regular turning and skin assessments for immobile patient  Routine Consultation with [facility] for Follow up Care  Sign Out:     .  Discussed with Emergency Department Provider    ------------------------------------------------------------------------------  History of Present Illness: Patient is a 63 year old Female.  Patient was brought by EMS for symptoms of seizure  63yo W w/PMH of HTN, HLD, seziures, DKA, EtOH abuse brought in by  EMS with seizure activity. Also likely fall with head trauma given soft tissue swelling. Pt was agitated in ED, given ativan and then intubated, now on propofol. Left gaze preference noted. Pt sedated, unresponsive, unable to follow commands.  Last seen normal was within 4.5 hours. There is history of hemorrhagic complications or intracranial hemorrhage. There is no history of Recent Anticoagulants.  Examination: BP(140/97), Blood Glucose(n/a) 1A: Level of Consciousness - Postures or Unresponsive + 3 1B: Ask Month and Age - Dysarthric/Intubated/ Trauma/Language Barrier +  1 1C: Blink Eyes & Squeeze Hands - Performs 0 Tasks + 2 2: Test Horizontal Extraocular Movements - Forced Gaze Palsy: Cannot Be Overcome + 2 3: Test Visual Fields - Bilateral Hemianopia + 3 4: Test Facial Palsy (Use Grimace if Obtunded) - Normal symmetry + 0 5A: Test Left Arm Motor Drift - No Movement + 4 5B: Test Right Arm Motor Drift - No Movement + 4 6A: Test Left Leg Motor Drift - No Movement + 4 6B: Test Right Leg Motor Drift - No Movement + 4 7: Test Limb Ataxia (FNF/Heel-Shin) - No Ataxia + 0 8: Test Sensation - Coma/Unresponsive + 2 9: Test Language/Aphasia - Coma/Unresponsive + 3 10: Test Dysarthria - Mute/Anarthric + 2 11: Test Extinction/Inattention - No abnormality + 0  NIHSS Score: 34  Patient/Family was informed the Neurology Consult would happen via TeleHealth consult by way of interactive audio and video telecommunications and consented to receiving care in this manner.  Due to the immediate potential for life-threatening deterioration due to underlying acute neurologic illness, I spent 35 minutes providing critical care. This time includes time for face to face visit via telemedicine, review of medical records, imaging studies and discussion of findings with providers, the patient and/or family.   Dr Carson MyrtleSandro Anabeth Chilcott   [facility] [phone]   Case [id number]

## 2019-04-30 NOTE — Progress Notes (Signed)
Assisted with transport of patient to CT and back to room. No noted respiratory issues at this time.

## 2019-04-30 NOTE — Progress Notes (Signed)
Pharmacy Antibiotic Note  Shannon Herrera is a 63 y.o. female admitted on 04/29/2019 with SAH.  Pharmacy has been consulted for Vancomycin/Cefepime dosing for fever. No known source of infection. WBC WNL. Renal function OK.   Plan: Vancomycin 750 mg IV q24h >>Estimated AUC: 491 Cefepime 2g IV q12h Trend WBC, temp, renal function  F/U infectious work-up Drug levels as indicated   Height: 5\' 3"  (160 cm) Weight: 119 lb 14.9 oz (54.4 kg) IBW/kg (Calculated) : 52.4  Temp (24hrs), Avg:101.2 F (38.4 C), Min:98.6 F (37 C), Max:101.7 F (38.7 C)  Recent Labs  Lab 04/29/19 2347 04/30/19 0215  WBC 9.4  --   CREATININE 1.12*  --   LATICACIDVEN 2.0* 2.4*    Estimated Creatinine Clearance: 42 mL/min (A) (by C-G formula based on SCr of 1.12 mg/dL (H)).    No Known Allergies  Narda Bonds, PharmD, BCPS Clinical Pharmacist Phone: [phone]

## 2019-04-30 NOTE — H&P (Addendum)
Subjective: Patient is a 63 y.o. female who presented to the ED after being found unresponsive by her family. Seizure like activity noted by family at home. She has a history of HTN, hypothyroidism, and chronic etoh abuse. Intubated and sedated in the ED at [facility]. [facility] for further care.   Past Medical History:  Diagnosis Date  . Diabetes mellitus without complication ([place])   . Hypertension   . Hypothyroid     Past Surgical History:  Procedure Laterality Date  . denies      No Known Allergies  Social History   Tobacco Use  . Smoking status: Never Smoker  . Smokeless tobacco: Never Used  Substance Use Topics  . Alcohol use: Yes    Comment: intoxicated at this time    Family History  Problem Relation Age of Onset  . Hypertension Mother    Prior to Admission medications   Medication Sig Start Date End Date Taking? Authorizing Provider  folic acid (FOLVITE) 1 MG tablet Take 1 tablet (1 mg total) by mouth daily. 02/08/19   Debbe Odea, MD  glucose monitoring kit (FREESTYLE) monitoring kit 1 each by Does not apply route 4 (four) times daily - after meals and at bedtime. 1 month Diabetic Testing Supplies for QAC-QHS accuchecks. 02/08/19   Debbe Odea, MD  insulin aspart protamine- aspart (NOVOLOG MIX 70/30) (70-30) 100 UNIT/ML injection Inject 0.12 mLs (12 Units total) into the skin 2 (two) times daily with a meal. 02/08/19   Debbe Odea, MD  Insulin Pen Needle (TECHLITE PEN NEEDLES) 31G X 8 MM MISC 1 each by Does not apply route 2 (two) times a day. 02/08/19   Debbe Odea, MD  levETIRAcetam (KEPPRA) 750 MG tablet Take 1 tablet (750 mg total) by mouth 2 (two) times daily. 02/08/19   Debbe Odea, MD  levothyroxine (SYNTHROID, LEVOTHROID) 100 MCG tablet Take 1 tablet (100 mcg total) by mouth daily before breakfast. 07/06/18 11/03/18  Gladstone Lighter, MD  metoprolol tartrate (LOPRESSOR) 50 MG tablet Take 1 tablet (50 mg total) by mouth 2 (two) times daily. 02/08/19   Debbe Odea, MD  thiamine (VITAMIN B-1) 100 MG tablet Take 1 tablet (100 mg total) by mouth daily. 02/08/19   Debbe Odea, MD     Review of Systems  Positive ROS: intubated and sedated  All other systems have been reviewed and were otherwise negative with the exception of those mentioned in the HPI and as above.  Objective: Vital signs in last 24 hours: Temp:  [98.6 F (37 C)-101.7 F (38.7 C)] 100.6 F (38.1 C) (07/19 0720) Pulse Rate:  [107-178] 112 (07/19 0720) Resp:  [16-33] 18 (07/19 0720) BP: (77-176)/(47-159) 142/95 (07/19 0720) SpO2:  [92 %-100 %] 100 % (07/19 0720) FiO2 (%):  [35 %-100 %] 40 % (07/19 0720) Weight:  [54.4 kg] 54.4 kg (07/18 2353)  General Appearance: sedated Head: Normocephalic, without obvious abnormality, atraumatic Eyes: PERRL, conjunctiva/corneas clear, EOM's intact, fundi benign, both eyes      Throat: ETT Lungs:  respirations unlabored Heart: Regular rate and rhythm Extremities: Extremities normal, atraumatic, no cyanosis or edema Pulses: 2+ and symmetric all extremities Skin: Skin color, texture, turgor normal, no rashes or lesions  NEUROLOGIC:   Mental status: sedated Motor Exam - withdraws to noxisou stimuli all four extremities Sensory Exam - grossly normal Reflexes:  Coordination -unable to test Gait - unable to test  Balance -unable to test Cranial Nerves: I: smell Not tested  II: visual acuity  OS:  na    OD: na  II: visual fields uta  II: pupils Equal, round, reactive to light  III,VII: ptosis uta  III,IV,VI: extraocular muscles  uta  V: mastication   V: facial light touch sensation    V,VII: corneal reflex    VII: facial muscle function - upper    VII: facial muscle function - lower   VIII: hearing   IX: soft palate elevation    IX,X: gag reflex positive  XI: trapezius strength    XI: sternocleidomastoid strength   XI: neck flexion strength    XII: tongue strength      Data Review Lab Results  Component Value Date    WBC 9.4 04/29/2019   HGB 10.2 (L) 04/30/2019   HCT 30.0 (L) 04/30/2019   MCV 100.0 04/29/2019   PLT 145 (L) 04/29/2019   Lab Results  Component Value Date   NA 141 04/30/2019   K 2.8 (L) 04/30/2019   CL 105 04/29/2019   CO2 22 04/29/2019   BUN 22 04/29/2019   CREATININE 1.12 (H) 04/29/2019   GLUCOSE 232 (H) 04/29/2019   Lab Results  Component Value Date   INR 1.2 04/29/2019    Assessment/Plan: 63 year old femaled presnted to [facility] ED after being found unresponsive. CT head showsModerate amount of subarachnoid hemorrhage in the left frontotemporal lobe extending into the left sylvian fissure and left central sulcus. Smaller amount of subarachnoid hemorrhage noted in the right frontal and parietal convexity. There is a large amount of blood in the quadrigeminal plate cistern and layering inferior to the right tentorium and along the right cerebellopontine angle. There is extension of the blood into the superior portion of the fourth ventricle. Appreciated CCM and neurology consults. Awaiting CTA head to rule out aneurysm.    Ocie Cornfield [facility] 04/30/2019 7:48 AM   Agree with above

## 2019-04-30 NOTE — Progress Notes (Signed)
Notified bedside nurse of need to draw lactic acid to monitor for trend down.

## 2019-04-30 NOTE — ED Notes (Signed)
CRITICAL VALUE ALERT  Critical Value:  Lactic acid 2.4  Date & Time Notied:  04/30/2019 0258  Provider Notified: Dr. Wyvonnia Dusky  Orders Received/Actions taken: See chart

## 2019-04-30 NOTE — Plan of Care (Signed)
  Problem: Education: Goal: Expressions of having a comfortable level of knowledge regarding the disease process will increase Outcome: Not Progressing   Problem: Coping: Goal: Ability to adjust to condition or change in health will improve Outcome: Not Progressing   Problem: Medication: Goal: Risk for medication side effects will decrease Outcome: Progressing   Problem: Self-Concept: Goal: Level of anxiety will decrease Outcome: Not Progressing

## 2019-04-30 NOTE — ED Notes (Signed)
Date and time results received: 04/30/19 00:20 (use smartphrase ".now" to insert current time)  Test: lactic acid Critical Value: 2.0 Name of Provider Notified: Dr Wyvonnia Dusky Orders Received? Or Actions Taken?: none

## 2019-04-30 NOTE — ED Notes (Signed)
First gas venous , Gas redrawn oxygen decreased to 40.

## 2019-04-30 NOTE — Progress Notes (Signed)
CRITICAL VALUE STICKER  CRITICAL VALUE: Lactic acid 3.4  RECEIVER (on-site recipient of call): Julieanne Cotton, RN   [place] NOTIFIED: 7/19 @ 1200  MESSENGER (representative from lab):  MD NOTIFIED: Dr. Karsten Fells   TIME OF NOTIFICATION: @1215   RESPONSE: NS orders, see [facility]

## 2019-04-30 NOTE — H&P (Signed)
NAME:  Shannon Herrera, MRN:  [id number], DOB:  03-03-1956, LOS: 0 ADMISSION DATE:  04/29/2019, CONSULTATION DATE:  04/30/19  CHIEF COMPLAINT:  SAH  Brief History   63 yo with hx of HTN, NIDDM, Hypothyroidism, and chornic etoh abuse presents to outside ED with possible seiure activity per family.   History of present illness   63 yo with hx of HTN, NIDDM, Hypothyroidism, and chornic etoh abuse presents to outside ED with possible seiure activity per family. Was noted to be  on ground and unresponsive per family. In Ed noted to have large hematoma and gaze deviation. Was intubated at outside Ed. In addition to this Ct head notable for [facility]. Labs notable for mild resp alkalosis of 7.42/28.9/451. Given SAH patient transferred to [facility] for further wokrup and management per neurosurgery.   Past Medical History  HTN NIDDM Hypothyroidism chornic etoh abuse  Significant Hospital Events   Patient intubated on 7/18 Patient transferred to Neurosurgery on 7/19  Consults:  Neurology Neurosurgery PCCM  Procedures:  Intubation   Significant Diagnostic Tests:  CT head on 7/18   IMPRESSION: 1. Extensive intracranial hemorrhage as described, likely traumatic. The possibility of an aneurysm is not entirely excluded and cannot be evaluated on this noncontrast CT. No significant mass effect. No midline shift. Close follow-up recommended. 2. No acute/traumatic cervical spine pathology. 3. Large left temporal and parietal scalp hematoma.  Micro Data:  BCX UCX Covid negative  Antimicrobials:  Per primary team  Interim history/subjective:  NA  Objective   Blood pressure (!) 87/67, pulse (!) 113, temperature (!) 101.7 F (38.7 C), resp. rate 18, height 5\' 3"  (1.6 m), weight 54.4 kg, SpO2 99 %.    Vent Mode: PRVC FiO2 (%):  [35 %-100 %] 35 % Set Rate:  [18 bmp] 18 bmp Vt Set:  [500 mL] 500 mL PEEP:  [5 cmH20] 5 cmH20 Plateau Pressure:  [17 cmH20] 17 cmH20   Intake/Output  Summary (Last 24 hours) at 04/30/2019 0258 Last data filed at 04/30/2019 0102 Gross per 24 hour  Intake 100 ml  Output 20 ml  Net 80 ml   Filed Weights   04/29/19 2353  Weight: 54.4 kg    Examination: General: Patient inutbated and sedated HENT: ET tube noted without significant secretions Lungs: Mechanical breath sounds without significant rhonchi or crackles Cardiovascular: RRR Abdomen: Soft non tender and non distended Extremities: Without gross deformity Neuro: GCS- 6T on propofol 40. Eyes pinpoint equal and reactive GU: Foley in place  Resolved Hospital Problem list   NA  Assessment & Plan:  This is a 63 year old with history as noted above who presents for [facility]. PCCM is consulted for vent and medical management.   Neuro-Patient with SAH. History of seizures on keppra -Interventions per Neuro -Would consider propofol for sedation to help suppress seizure like activity.  -Fentanyl for analgesia -Keppra loaded at outside ED -Continue Keppra maintenance   Cardiac-Patient with history of HTN. Does not appear well controlled. -Goal BP to be determined per neurology. However would consider keeping SBP less than 160 and MaP less than 110  Pulm-Patient intubated. -Patient without significant cerebral edema per imaging thus think that little utility in hyperventilating.  -Would use PRVC-8 cc kg. Peep-5 Fio2-40% Rate-18 -Patient initiating breaths on her own and thus think that can do daily SBP's on 5/5 even if not going to extubate so as to excercis diaphragm transiently -Repeat EKG here so as to assess tube placement -Aspiration precautions with  HOB at 30 if able -ABG Q AM  GI- -Feeds when appropriate -GI prophylaxis to be started as patient likely will be intubated for some time    ID-Patient with no obvious source of infection. Patient is febrile. No leukocytosis.  -Agree with broad spectrum abx while cultures pending -FU cx's        Best practice:  Diet:  Tube Feed when able Pain/Anxiety/Delirium protocol (if indicated): Propfol and fentanyl VAP protocol (if indicated): HOB at 30 DVT prophylaxis: Contraindicated for now GI prophylaxis: Pepcid Glucose control: Every 4 hours with correctional Mobility: NA Code Status: Full per primary Family Communication: Per primary Disposition: NA  Labs   CBC: Recent Labs  Lab 04/29/19 2347  WBC 9.4  NEUTROABS 4.6  HGB 12.2  HCT 38.4  MCV 100.0  PLT 145*    Basic Metabolic Panel: Recent Labs  Lab 04/29/19 2347  NA 138  K 3.0*  CL 105  CO2 22  GLUCOSE 232*  BUN 22  CREATININE 1.12*  CALCIUM 9.1   GFR: Estimated Creatinine Clearance: 42 mL/min (A) (by C-G formula based on SCr of 1.12 mg/dL (H)). Recent Labs  Lab 04/29/19 2347  WBC 9.4  LATICACIDVEN 2.0*    Liver Function Tests: Recent Labs  Lab 04/29/19 2347  AST 67*  ALT 49*  ALKPHOS 64  BILITOT 1.8*  PROT 8.6*  ALBUMIN 4.9   No results for input(s): LIPASE, AMYLASE in the last 168 hours. No results for input(s): AMMONIA in the last 168 hours.  ABG    Component Value Date/Time   PHART 7.420 04/30/2019 0030   PCO2ART 28.9 (L) 04/30/2019 0030   PO2ART 451 (H) 04/30/2019 0030   HCO3 20.7 04/30/2019 0030   TCO2 23 02/06/2019 0041   ACIDBASEDEF 5.2 (H) 04/30/2019 0030   O2SAT 99.9 04/30/2019 0030     Coagulation Profile: Recent Labs  Lab 04/29/19 2347  INR 1.2    Cardiac Enzymes: No results for input(s): CKTOTAL, CKMB, CKMBINDEX, TROPONINI in the last 168 hours.  HbA1C: Hgb A1c MFr Bld  Date/Time Value Ref Range Status  02/05/2019 11:33 PM 11.1 (H) 4.8 - 5.6 % Final    Comment:    (NOTE) Pre diabetes:          5.7%-6.4% Diabetes:              >6.4% Glycemic control for   <7.0% adults with diabetes   07/04/2018 03:30 PM 8.6 (H) 4.8 - 5.6 % Final    Comment:    (NOTE)         Prediabetes: 5.7 - 6.4         Diabetes: >6.4         Glycemic control for adults with diabetes: <7.0     CBG: No  results for input(s): GLUCAP in the last 168 hours.  Review of Systems:   Pertinent positives and negatives per HPI  Past Medical History  She,  has a past medical history of Diabetes mellitus without complication (HCC), Hypertension, and Hypothyroid.   Surgical History    Past Surgical History:  Procedure Laterality Date  . denies       Social History   reports that she has never smoked. She has never used smokeless tobacco. She reports current alcohol use. She reports that she does not use drugs.   Family History   Her family history includes Hypertension in her mother.   Allergies No Known Allergies   Home Medications  Prior to Admission medications  Medication Sig Start Date End Date Taking? Authorizing Provider  folic acid (FOLVITE) 1 MG tablet Take 1 tablet (1 mg total) by mouth daily. 02/08/19   Calvert Cantor, MD  glucose monitoring kit (FREESTYLE) monitoring kit 1 each by Does not apply route 4 (four) times daily - after meals and at bedtime. 1 month Diabetic Testing Supplies for QAC-QHS accuchecks. 02/08/19   Calvert Cantor, MD  insulin aspart protamine- aspart (NOVOLOG MIX 70/30) (70-30) 100 UNIT/ML injection Inject 0.12 mLs (12 Units total) into the skin 2 (two) times daily with a meal. 02/08/19   Calvert Cantor, MD  Insulin Pen Needle (TECHLITE PEN NEEDLES) 31G X 8 MM MISC 1 each by Does not apply route 2 (two) times a day. 02/08/19   Calvert Cantor, MD  levETIRAcetam (KEPPRA) 750 MG tablet Take 1 tablet (750 mg total) by mouth 2 (two) times daily. 02/08/19   Calvert Cantor, MD  levothyroxine (SYNTHROID, LEVOTHROID) 100 MCG tablet Take 1 tablet (100 mcg total) by mouth daily before breakfast. 07/06/18 11/03/18  Enid Baas, MD  metoprolol tartrate (LOPRESSOR) 50 MG tablet Take 1 tablet (50 mg total) by mouth 2 (two) times daily. 02/08/19   Calvert Cantor, MD  thiamine (VITAMIN B-1) 100 MG tablet Take 1 tablet (100 mg total) by mouth daily. 02/08/19   Calvert Cantor, MD      Critical care time: 35 minutes

## 2019-04-30 NOTE — Progress Notes (Signed)
Patient transported from CT to [id number] with no complications.

## 2019-05-01 ENCOUNTER — Inpatient Hospital Stay (HOSPITAL_COMMUNITY): Payer: Self-pay

## 2019-05-01 DIAGNOSIS — I609 Nontraumatic subarachnoid hemorrhage, unspecified: Secondary | ICD-10-CM

## 2019-05-01 DIAGNOSIS — J9610 Chronic respiratory failure, unspecified whether with hypoxia or hypercapnia: Secondary | ICD-10-CM

## 2019-05-01 LAB — GLUCOSE, CAPILLARY
Glucose-Capillary: 162 mg/dL — ABNORMAL HIGH (ref 70–99)
Glucose-Capillary: 197 mg/dL — ABNORMAL HIGH (ref 70–99)
Glucose-Capillary: 189 mg/dL — ABNORMAL HIGH (ref 70–99)
Glucose-Capillary: 143 mg/dL — ABNORMAL HIGH (ref 70–99)
Glucose-Capillary: 129 mg/dL — ABNORMAL HIGH (ref 70–99)
Glucose-Capillary: 129 mg/dL — ABNORMAL HIGH (ref 70–99)

## 2019-05-01 LAB — CBC WITH DIFFERENTIAL/PLATELET
Abs Immature Granulocytes: 0.04 10*3/uL (ref 0.00–0.07)
Basophils Absolute: 0 10*3/uL (ref 0.0–0.1)
Basophils Relative: 0 %
Eosinophils Absolute: 0.2 10*3/uL (ref 0.0–0.5)
Eosinophils Relative: 2 %
HCT: 29.2 % — ABNORMAL LOW (ref 36.0–46.0)
Hemoglobin: 9.3 g/dL — ABNORMAL LOW (ref 12.0–15.0)
Immature Granulocytes: 1 %
Lymphocytes Relative: 24 %
Lymphs Abs: 2 10*3/uL (ref 0.7–4.0)
MCH: 32.7 pg (ref 26.0–34.0)
MCHC: 31.8 g/dL (ref 30.0–36.0)
MCV: 102.8 fL — ABNORMAL HIGH (ref 80.0–100.0)
Monocytes Absolute: 0.6 10*3/uL (ref 0.1–1.0)
Monocytes Relative: 7 %
Neutro Abs: 5.5 10*3/uL (ref 1.7–7.7)
Neutrophils Relative %: 66 %
Platelets: 83 10*3/uL — ABNORMAL LOW (ref 150–400)
RBC: 2.84 MIL/uL — ABNORMAL LOW (ref 3.87–5.11)
RDW: 13.6 % (ref 11.5–15.5)
WBC: 8.2 10*3/uL (ref 4.0–10.5)
nRBC: 0 % (ref 0.0–0.2)

## 2019-05-01 LAB — MAGNESIUM
Magnesium: 1.5 mg/dL — ABNORMAL LOW (ref 1.7–2.4)
Magnesium: 1.9 mg/dL (ref 1.7–2.4)

## 2019-05-01 LAB — BASIC METABOLIC PANEL
Anion gap: 8 (ref 5–15)
BUN: 20 mg/dL (ref 8–23)
CO2: 18 mmol/L — ABNORMAL LOW (ref 22–32)
Calcium: 8 mg/dL — ABNORMAL LOW (ref 8.9–10.3)
Chloride: 113 mmol/L — ABNORMAL HIGH (ref 98–111)
Creatinine, Ser: 0.87 mg/dL (ref 0.44–1.00)
GFR calc Af Amer: >60 mL/min (ref >60)
GFR calc non Af Amer: >60 mL/min (ref >60)
Glucose, Bld: 233 mg/dL — ABNORMAL HIGH (ref 70–99)
Potassium: 3.6 mmol/L (ref 3.5–5.1)
Sodium: 139 mmol/L (ref 135–145)

## 2019-05-01 LAB — TRIGLYCERIDES: Triglycerides: 139 mg/dL (ref <150)

## 2019-05-01 LAB — LACTIC ACID, PLASMA: Lactic Acid, Venous: 0.9 mmol/L (ref 0.5–1.9)

## 2019-05-01 LAB — PHOSPHORUS
Phosphorus: 2.8 mg/dL (ref 2.5–4.6)
Phosphorus: 2.6 mg/dL (ref 2.5–4.6)

## 2019-05-01 MED ORDER — INSULIN ASPART 100 UNIT/ML ~~LOC~~ SOLN
0.0000 [IU] | SUBCUTANEOUS | Status: DC
  Administered 2019-05-01: 2 [IU] via SUBCUTANEOUS
  Administered 2019-05-01: 3 [IU] via SUBCUTANEOUS
  Administered 2019-05-02: 5 [IU] via SUBCUTANEOUS
  Administered 2019-05-02: 3 [IU] via SUBCUTANEOUS
  Administered 2019-05-02 – 2019-05-03 (×4): 2 [IU] via SUBCUTANEOUS
  Administered 2019-05-03: 5 [IU] via SUBCUTANEOUS
  Administered 2019-05-03: 2 [IU] via SUBCUTANEOUS
  Administered 2019-05-03 (×2): 5 [IU] via SUBCUTANEOUS
  Administered 2019-05-03 – 2019-05-04 (×2): 3 [IU] via SUBCUTANEOUS
  Administered 2019-05-04: 2 [IU] via SUBCUTANEOUS
  Administered 2019-05-04: 3 [IU] via SUBCUTANEOUS
  Administered 2019-05-04 (×2): 2 [IU] via SUBCUTANEOUS
  Administered 2019-05-04 – 2019-05-05 (×2): 8 [IU] via SUBCUTANEOUS
  Administered 2019-05-05: 2 [IU] via SUBCUTANEOUS
  Administered 2019-05-05: 5 [IU] via SUBCUTANEOUS
  Administered 2019-05-05 – 2019-05-06 (×3): 2 [IU] via SUBCUTANEOUS
  Administered 2019-05-06: 8 [IU] via SUBCUTANEOUS
  Administered 2019-05-06: 3 [IU] via SUBCUTANEOUS
  Administered 2019-05-06: 2 [IU] via SUBCUTANEOUS

## 2019-05-01 MED ORDER — NIMODIPINE 6 MG/ML PO SOLN
60.0000 mg | ORAL | Status: DC
  Administered 2019-05-01 – 2019-05-06 (×28): 60 mg via ORAL
  Filled 2019-05-01 (×31): qty 10

## 2019-05-01 MED ORDER — MAGNESIUM SULFATE 2 GM/50ML IV SOLN
2.0000 g | Freq: Once | INTRAVENOUS | Status: AC
  Administered 2019-05-01: 2 g via INTRAVENOUS
  Filled 2019-05-01: qty 50

## 2019-05-01 MED ORDER — ORAL CARE MOUTH RINSE
15.0000 mL | Freq: Two times a day (BID) | OROMUCOSAL | Status: DC
  Administered 2019-05-01 – 2019-05-06 (×9): 15 mL via OROMUCOSAL

## 2019-05-01 NOTE — Progress Notes (Signed)
Subjective: Patient reports Doing much better extubated not complaining of any headache  Objective: Vital signs in last 24 hours: Temp:  [98.4 F (36.9 C)-101.5 F (38.6 C)] 100.9 F (38.3 C) (07/20 1215) Pulse Rate:  [77-106] 94 (07/20 1200) Resp:  [11-24] 13 (07/20 1200) BP: (74-141)/(46-86) 106/55 (07/20 1215) SpO2:  [100 %] 100 % (07/20 1200) FiO2 (%):  [30 %-40 %] 30 % (07/20 1200) Weight:  [60.8 kg] 60.8 kg (07/20 0418)  Intake/Output from previous day: 07/19 0701 - 07/20 0700 In: 4752.4 [I.V.:3167; NG/GT:286.7; IV Piggyback:1298.8] Out: 1825 [Urine:1525; Emesis/NG output:300] Intake/Output this shift: Total I/O In: 1087.6 [I.V.:134.1; NG/GT:659.7; IV Piggyback:293.8] Out: -   Awake alert moves all extremities well  Lab Results: Recent Labs    04/29/19 2347 04/30/19 0721 05/01/19 0440  WBC 9.4  --  8.2  HGB 12.2 10.2* 9.3*  HCT 38.4 30.0* 29.2*  PLT 145*  --  83*   BMET Recent Labs    04/29/19 2347 04/30/19 0721 05/01/19 0440  NA 138 141 139  K 3.0* 2.8* 3.6  CL 105  --  113*  CO2 22  --  18*  GLUCOSE 232*  --  233*  BUN 22  --  20  CREATININE 1.12*  --  0.87  CALCIUM 9.1  --  8.0*    Studies/Results: Ct Angio Head W Or Wo Contrast  Result Date: 04/30/2019 CLINICAL DATA:  Follow-up post traumatic subarachnoid hemorrhage. EXAM: CT ANGIOGRAPHY HEAD AND NECK TECHNIQUE: Multidetector CT imaging of the head and neck was performed using the standard protocol during bolus administration of intravenous contrast. Multiplanar CT image reconstructions and MIPs were obtained to evaluate the vascular anatomy. Carotid stenosis measurements (when applicable) are obtained utilizing NASCET criteria, using the distal internal carotid diameter as the denominator. CONTRAST:  50mL OMNIPAQUE IOHEXOL 350 MG/ML SOLN COMPARISON:  Head CT yesterday.  CT angiography 02/05/2019 FINDINGS: CT HEAD FINDINGS Brain: Post traumatic subarachnoid hemorrhage in the posterior fossa cisterns  and in the supratentorial subarachnoid spaces left more than right has not increased in volume. No evidence developing subdural or epidural collection. No evidence of intraparenchymal hemorrhage. No mass effect or shift. Vascular: See below. Skull: No displaced skull fracture. Cannot rule out minimal fracture in the region of the outer table coronal suture on the left. Sinuses: Mucosal thickening and some layering fluid on the right. Orbits: Negative Review of the MIP images confirms the above findings CTA NECK FINDINGS Aortic arch: Normal Right carotid system: Normal Left carotid system: Atherosclerotic calcification at the bifurcation and proximal ICA bulb but no stenosis. Vertebral arteries: Normal Skeleton: Mid cervical spondylosis.  No traumatic finding. Other neck: Normal.  Intubated.  Orogastric tube. Upper chest: Mild dependent atelectasis on the right. Review of the MIP images confirms the above findings CTA HEAD FINDINGS Anterior circulation: Both internal carotid arteries are widely patent through the skull base and siphon regions. The anterior and middle cerebral vessels are patent without evidence of aneurysm or vascular malformation. There may be mild spasm on the left. Posterior circulation: Both vertebral arteries are patent to the basilar. No basilar stenosis. Posterior circulation branch vessels are patent. Large posterior communicating arteries on each side. Venous sinuses: Patent and normal. Anatomic variants: None significant. Delayed phase: Not performed. Review of the MIP images confirms the above findings IMPRESSION: No increase in post traumatic subarachnoid hemorrhage. No development of intraparenchymal hemorrhage, mass effect or shift. No primary vascular pathology is seen. No evidence of aneurysm or vascular malformation. There may  be mild spasm in the anterior circulation vessels on the left. Electronically Signed   By: Paulina FusiMark  Shogry M.D.   On: 04/30/2019 09:04   Ct Head Wo  Contrast  Result Date: 04/30/2019 CLINICAL DATA:  63 year old female with seizure. Code stroke. EXAM: CT HEAD WITHOUT CONTRAST CT CERVICAL SPINE WITHOUT CONTRAST TECHNIQUE: Multidetector CT imaging of the head and cervical spine was performed following the standard protocol without intravenous contrast. Multiplanar CT image reconstructions of the cervical spine were also generated. COMPARISON:  Head CT dated 02/05/2019 FINDINGS: CT HEAD FINDINGS Brain: There is mild age-related atrophy and moderate chronic microvascular ischemic changes. Moderate amount of subarachnoid hemorrhage in the left frontotemporal lobe extending into the left sylvian fissure and left central sulcus. Smaller amount of subarachnoid hemorrhage noted in the right frontal and parietal convexity. There is a large amount of blood in the quadrigeminal plate cistern and layering inferior to the right tentorium and along the right cerebellopontine angle. There is extension of the blood into the superior portion of the fourth ventricle. There is subarachnoid hemorrhage in the superior aspect of the right cerebellar hemisphere. No ventriculomegaly. No significant mass effect. No midline shift. Vascular: Grossly unremarkable on suboptimal visualization. Skull: Normal. Negative for fracture or focal lesion. Sinuses/Orbits: There is mild diffuse mucoperiosteal thickening of paranasal sinuses with air-fluid level in the right maxillary sinus and posterior nasal passages and nasopharynx. The mastoid air cells are clear. Other: A support tube is partially visualized. There is a large left temporal and parietal scalp hematoma. CT CERVICAL SPINE FINDINGS Alignment: No acute subluxation. Skull base and vertebrae: No acute fracture. Osteopenia. Soft tissues and spinal canal: No prevertebral fluid or swelling. No visible canal hematoma. Disc levels: Multilevel degenerative changes most prominent and C5-C6 and C6-C7. Upper chest: Negative. Other: An endotracheal  and an enteric tube partially visualized. IMPRESSION: 1. Extensive intracranial hemorrhage as described, likely traumatic. The possibility of an aneurysm is not entirely excluded and cannot be evaluated on this noncontrast CT. No significant mass effect. No midline shift. Close follow-up recommended. 2. No acute/traumatic cervical spine pathology. 3. Large left temporal and parietal scalp hematoma. These results were called by telephone at the time of interpretation on 04/30/2019 at 12:15 am to Dr. Glynn OctaveSTEPHEN RANCOUR , who verbally acknowledged these results. Electronically Signed   By: Elgie CollardArash  Radparvar M.D.   On: 04/30/2019 00:17   Ct Angio Neck W Or Wo Contrast  Result Date: 04/30/2019 CLINICAL DATA:  Follow-up post traumatic subarachnoid hemorrhage. EXAM: CT ANGIOGRAPHY HEAD AND NECK TECHNIQUE: Multidetector CT imaging of the head and neck was performed using the standard protocol during bolus administration of intravenous contrast. Multiplanar CT image reconstructions and MIPs were obtained to evaluate the vascular anatomy. Carotid stenosis measurements (when applicable) are obtained utilizing NASCET criteria, using the distal internal carotid diameter as the denominator. CONTRAST:  50mL OMNIPAQUE IOHEXOL 350 MG/ML SOLN COMPARISON:  Head CT yesterday.  CT angiography 02/05/2019 FINDINGS: CT HEAD FINDINGS Brain: Post traumatic subarachnoid hemorrhage in the posterior fossa cisterns and in the supratentorial subarachnoid spaces left more than right has not increased in volume. No evidence developing subdural or epidural collection. No evidence of intraparenchymal hemorrhage. No mass effect or shift. Vascular: See below. Skull: No displaced skull fracture. Cannot rule out minimal fracture in the region of the outer table coronal suture on the left. Sinuses: Mucosal thickening and some layering fluid on the right. Orbits: Negative Review of the MIP images confirms the above findings CTA NECK FINDINGS Aortic arch:  Normal Right carotid system: Normal Left carotid system: Atherosclerotic calcification at the bifurcation and proximal ICA bulb but no stenosis. Vertebral arteries: Normal Skeleton: Mid cervical spondylosis.  No traumatic finding. Other neck: Normal.  Intubated.  Orogastric tube. Upper chest: Mild dependent atelectasis on the right. Review of the MIP images confirms the above findings CTA HEAD FINDINGS Anterior circulation: Both internal carotid arteries are widely patent through the skull base and siphon regions. The anterior and middle cerebral vessels are patent without evidence of aneurysm or vascular malformation. There may be mild spasm on the left. Posterior circulation: Both vertebral arteries are patent to the basilar. No basilar stenosis. Posterior circulation branch vessels are patent. Large posterior communicating arteries on each side. Venous sinuses: Patent and normal. Anatomic variants: None significant. Delayed phase: Not performed. Review of the MIP images confirms the above findings IMPRESSION: No increase in post traumatic subarachnoid hemorrhage. No development of intraparenchymal hemorrhage, mass effect or shift. No primary vascular pathology is seen. No evidence of aneurysm or vascular malformation. There may be mild spasm in the anterior circulation vessels on the left. Electronically Signed   By: Nelson Chimes M.D.   On: 04/30/2019 09:04   Ct Cervical Spine Wo Contrast  Result Date: 04/30/2019 CLINICAL DATA:  63 year old female with seizure. Code stroke. EXAM: CT HEAD WITHOUT CONTRAST CT CERVICAL SPINE WITHOUT CONTRAST TECHNIQUE: Multidetector CT imaging of the head and cervical spine was performed following the standard protocol without intravenous contrast. Multiplanar CT image reconstructions of the cervical spine were also generated. COMPARISON:  Head CT dated 02/05/2019 FINDINGS: CT HEAD FINDINGS Brain: There is mild age-related atrophy and moderate chronic microvascular ischemic  changes. Moderate amount of subarachnoid hemorrhage in the left frontotemporal lobe extending into the left sylvian fissure and left central sulcus. Smaller amount of subarachnoid hemorrhage noted in the right frontal and parietal convexity. There is a large amount of blood in the quadrigeminal plate cistern and layering inferior to the right tentorium and along the right cerebellopontine angle. There is extension of the blood into the superior portion of the fourth ventricle. There is subarachnoid hemorrhage in the superior aspect of the right cerebellar hemisphere. No ventriculomegaly. No significant mass effect. No midline shift. Vascular: Grossly unremarkable on suboptimal visualization. Skull: Normal. Negative for fracture or focal lesion. Sinuses/Orbits: There is mild diffuse mucoperiosteal thickening of paranasal sinuses with air-fluid level in the right maxillary sinus and posterior nasal passages and nasopharynx. The mastoid air cells are clear. Other: A support tube is partially visualized. There is a large left temporal and parietal scalp hematoma. CT CERVICAL SPINE FINDINGS Alignment: No acute subluxation. Skull base and vertebrae: No acute fracture. Osteopenia. Soft tissues and spinal canal: No prevertebral fluid or swelling. No visible canal hematoma. Disc levels: Multilevel degenerative changes most prominent and C5-C6 and C6-C7. Upper chest: Negative. Other: An endotracheal and an enteric tube partially visualized. IMPRESSION: 1. Extensive intracranial hemorrhage as described, likely traumatic. The possibility of an aneurysm is not entirely excluded and cannot be evaluated on this noncontrast CT. No significant mass effect. No midline shift. Close follow-up recommended. 2. No acute/traumatic cervical spine pathology. 3. Large left temporal and parietal scalp hematoma. These results were called by telephone at the time of interpretation on 04/30/2019 at 12:15 am to Dr. Ezequiel Essex , who verbally  acknowledged these results. Electronically Signed   By: Anner Crete M.D.   On: 04/30/2019 00:17   Dg Chest Port 1 View  Result Date: 05/01/2019 CLINICAL DATA:  Patient  status post subarachnoid hemorrhage 04/29/2019. Intubated. EXAM: PORTABLE CHEST 1 VIEW COMPARISON:  Single-view of the chest 04/30/2019. FINDINGS: Support tubes and lines are unchanged and project in good position. Lungs are clear. No pneumothorax or pleural fluid. Heart size is normal. IMPRESSION: Support tubes and lines projecting good position.  Lungs are clear. Electronically Signed   By: Drusilla Kanner M.D.   On: 05/01/2019 08:07   Dg Chest Port 1 View  Result Date: 04/30/2019 CLINICAL DATA:  Central line placement EXAM: PORTABLE CHEST 1 VIEW COMPARISON:  April 30, 2019 FINDINGS: The right-sided central venous catheter is well position with the tip terminating near the cavoatrial junction. The endotracheal tube terminates approximately 3.8 cm above the carina. The enteric tube extends below the left hemidiaphragm. The heart size is stable. There is no pneumothorax. No large pleural effusion. No focal infiltrate. IMPRESSION: 1. Lines and tubes as above.  No right-sided pneumothorax. 2. Otherwise, stable appearance of the chest. Electronically Signed   By: Katherine Mantle M.D.   On: 04/30/2019 19:40   Dg Chest Port 1 View  Result Date: 04/30/2019 CLINICAL DATA:  Intubated. EXAM: PORTABLE CHEST 1 VIEW COMPARISON:  Yesterday. FINDINGS: Endotracheal tube tip 2.8 cm above the carina. Nasogastric tube extending into the stomach with the side hole in the proximal to mid stomach and tip not included. Normal sized heart. Clear lungs with normal vascularity. Thoracic spine degenerative changes. IMPRESSION: 1. No acute abnormality. 2. Endotracheal tube tip 2.8 cm above the carina. This could be retracted 4 cm to place it at the level of the clavicles. Electronically Signed   By: Beckie Salts M.D.   On: 04/30/2019 10:53   Dg Chest  Portable 1 View  Result Date: 04/30/2019 CLINICAL DATA:  63 year old female status post intubation. EXAM: PORTABLE CHEST 1 VIEW COMPARISON:  Chest radiograph dated 02/05/2019 FINDINGS: Endotracheal tube with tip approximately 4 cm above the carina. An enteric tube extends into the left hemiabdomen with tip beyond the inferior margin of the image. Partially visualized indeterminate linear radiopaque structure over the base of the neck on the right. The lungs are clear. There is no pleural effusion or pneumothorax. The cardiac silhouette is within normal limits. No acute osseous pathology. IMPRESSION: 1. No acute cardiopulmonary process. 2. Endotracheal tube above the carina. Electronically Signed   By: Elgie Collard M.D.   On: 04/30/2019 00:26   Vas [place] Transcranial Doppler  Result Date: 05/01/2019  Transcranial Doppler Indications: Subarachnoid hemorrhage. Limitations: Patient positioning, patient somnolence, respiratory disturbance Limitations for diagnostic windows: Unable to insonate left transtemporal window. Comparison Study: No prior studies. Performing Technologist: Chanda Busing RVT  Examination Guidelines: A complete evaluation includes B-mode imaging, spectral Doppler, color Doppler, and power Doppler as needed of all accessible portions of each vessel. Bilateral testing is considered an integral part of a complete examination. Limited examinations for reoccurring indications may be performed as noted.  +----------+------------+---------+-----------+--------------------------------+ RIGHT TCD   Right VM    Depth  Pulsatility            Comment                            (cm)      (cm)                                               +----------+------------+---------+-----------+--------------------------------+  MCA          37.00                1.33                                     +----------+------------+---------+-----------+--------------------------------+ ACA                                              Unable to visualize        +----------+------------+---------+-----------+--------------------------------+ Term ICA                                        Unable to visualize        +----------+------------+---------+-----------+--------------------------------+ PCA          25.00                1.33                                     +----------+------------+---------+-----------+--------------------------------+ Opthalmic    17.00                1.82                                     +----------+------------+---------+-----------+--------------------------------+ ICA siphon   24.00                1.55                                     +----------+------------+---------+-----------+--------------------------------+ Vertebral    -25.00               1.38                                     +----------+------------+---------+-----------+--------------------------------+ Distal ICA                                   Unable to visualize due to                                                          bandages             +----------+------------+---------+-----------+--------------------------------+  +----------+------------+----------+-----------+-------------------+ LEFT TCD  Left VM (cm)Depth (cm)Pulsatility      Comment       +----------+------------+----------+-----------+-------------------+ MCA                                        Unable to visualize +----------+------------+----------+-----------+-------------------+ ACA  Unable to visualize +----------+------------+----------+-----------+-------------------+ Term ICA                                   Unable to visualize +----------+------------+----------+-----------+-------------------+ PCA                                        Unable to visualize  +----------+------------+----------+-----------+-------------------+ Opthalmic    19.00                 1.69                        +----------+------------+----------+-----------+-------------------+ ICA siphon   28.00                 1.54                        +----------+------------+----------+-----------+-------------------+ Vertebral    -32.00                 1.3                        +----------+------------+----------+-----------+-------------------+ Distal ICA   100.00                                            +----------+------------+----------+-----------+-------------------+  +------------+-------+-------------------+             VM cm/s      Comment       +------------+-------+-------------------+ Prox Basilar-41.00        1.56         +------------+-------+-------------------+ Dist Basilar       Unable to visualize +------------+-------+-------------------+ Summary:  Absent left temporal and poor right temporal windows limit evaluation. Elevated distal left ICA mean flow velocities suggest mild vasospasm.Normal mean flow velocities in posterior circulation vessels. *See table(s) above for measurements and observations.  Diagnosing physician: Delia HeadyPramod Sethi MD Electronically signed by Delia HeadyPramod Sethi MD on 05/01/2019 at 1:01:01 PM.    Final     Assessment/Plan: Hospital day 1 post bleed day 2 perimesencephalic subarachnoid hemorrhage.  Patient extubated now follows commands no additional evidence of seizure activity.  Will observe patient will need follow-up angiogram or possible CTA.  We will continue observation in the ICU for another day or so  LOS: 1 day     Kanani Mowbray P 05/01/2019, 2:28 PM

## 2019-05-01 NOTE — Plan of Care (Signed)
  Problem: Nutrition: Goal: Dietary intake will improve Outcome: Progressing Tube feeding started.

## 2019-05-01 NOTE — Evaluation (Signed)
Clinical/Bedside Swallow Evaluation Patient Details  Name: Shannon Herrera MRN: [id number] Date of Birth: 06-Nov-1955  Today's Date: 05/01/2019 Time: SLP Start Time (ACUTE ONLY): 1515 SLP Stop Time (ACUTE ONLY): 1530 SLP Time Calculation (min) (ACUTE ONLY): 15 min  Past Medical History:  Past Medical History:  Diagnosis Date  . Diabetes mellitus without complication (HCC)   . Hypertension   . Hypothyroid    Past Surgical History:  Past Surgical History:  Procedure Laterality Date  . denies     HPI:  Patient is a 63 y.o. female with PMH : DM, HTN, hypothyroid, who presented to the ED after being found unresponsive by her family. Seizure-like activity was noted by family at home, she has a h/o chronic ETOH abuse. She was intubated and sedated in ED at [facility] and transferred to [facility] for further care. She was extubated on 7/20.   Assessment / Plan / Recommendation Clinical Impression  Patient presents with a mild pharyngeal dysphagia which is likely primarily secondary to post-extubation. She was lethargic/fatigued but able to maintain alertness for safe PO intake of ice chips and straw sips of water. Puree and regular solids were not attempted secondary to patient's lethargy. She exhibited delayed cough on 2 of 6 straw sips of water, however cough was mild, dry sounding and her vocal quality did not change. Plan for trials of solids and liquids next date and expect ability to start full PO diet when able to maintain adequate alertness. SLP Visit Diagnosis: Dysphagia, unspecified (R13.10)    Aspiration Risk  Mild aspiration risk    Diet Recommendation NPO except meds;Ice chips PRN after oral care   Medication Administration: Whole meds with puree Postural Changes: Seated upright at 90 degrees    Other  Recommendations Oral Care Recommendations: Oral care BID   Follow up Recommendations None      Frequency and Duration min 1 x/week  1 week       Prognosis Prognosis  for Safe Diet Advancement: Good      Swallow Study   General Date of Onset: 04/29/19 HPI: Patient is a 63 y.o. female with PMH : DM, HTN, hypothyroid, who presented to the ED after being found unresponsive by her family. Seizure-like activity was noted by family at home, she has a h/o chronic ETOH abuse. She was intubated and sedated in ED at [facility] and transferred to [facility] for further care. She was extubated on 7/20. Type of Study: Bedside Swallow Evaluation Previous Swallow Assessment: N/A Diet Prior to this Study: NPO Temperature Spikes Noted: No Respiratory Status: Nasal cannula History of Recent Intubation: Yes Length of Intubations (days): 3 days Date extubated: 05/01/19 Behavior/Cognition: Alert;Lethargic/Drowsy;Cooperative Oral Cavity Assessment: Within Functional Limits Oral Care Completed by SLP: Yes Oral Cavity - Dentition: Adequate natural dentition Self-Feeding Abilities: Total assist Patient Positioning: Upright in bed Baseline Vocal Quality: Low vocal intensity;Hoarse Volitional Cough: Weak Volitional Swallow: Able to elicit    Oral/Motor/Sensory Function Overall Oral Motor/Sensory Function: Within functional limits   Ice Chips Ice chips: Impaired Pharyngeal Phase Impairments: Suspected delayed Swallow;Throat Clearing - Delayed   Thin Liquid Thin Liquid: Impaired Pharyngeal  Phase Impairments: Suspected delayed Swallow;Cough - Delayed Other Comments: mild cough occured in 2 of 5 trials of straw sips of thin liquids    Nectar Thick Nectar Thick Liquid: Not tested   Honey Thick     Puree Puree: Not tested   Solid     Solid: Not tested  Shannon Herrera 05/01/2019,5:09 PM  Angela Nevin, MA, CCC-SLP Speech Therapy [facility] Acute Rehab Pager: [phone]

## 2019-05-01 NOTE — Progress Notes (Signed)
Assisted tele visit to patient with family member.  Taj Arteaga P, RN  

## 2019-05-01 NOTE — Procedures (Signed)
Extubation Procedure Note  Patient Details:   Name: MARJARIE IRION DOB: [birth year] MRN: [id number]   Airway Documentation:    Vent end date: 05/01/19 Vent end time: 1024   Evaluation  O2 sats: stable throughout Complications: No apparent complications Patient did tolerate procedure well. Bilateral Breath Sounds: Clear, Diminished   Yes   Pt extubated per MD order to 3L [place]. Cuff leak heard pre-extubation. Pt able to speak. Clear, Diminished bilateral breath sounds heard. RT and RN at bedside. Pt stable and no apparent complications at this time.  Esperanza Sheets T 05/01/2019, 10:25 AM

## 2019-05-01 NOTE — Progress Notes (Addendum)
NAME:  Shannon Herrera, MRN:  [id number], DOB:  [birth year], LOS: 1 ADMISSION DATE:  04/29/2019, CONSULTATION DATE:  04/30/19  CHIEF COMPLAINT:  SAH  Brief History   63 yo with hx of HTN, NIDDM, Hypothyroidism, and chornic etoh abuse presents to outside ED with possible seiure activity per family.   History of present illness   63 yo with hx of HTN, NIDDM, Hypothyroidism, and chornic etoh abuse presents to outside ED with possible seiure activity per family. Was noted to be  on ground and unresponsive per family. In Ed noted to have large hematoma and gaze deviation. Was intubated at outside Ed. In addition to this Ct head notable for [facility]. Labs notable for mild resp alkalosis of 7.42/28.9/451. Given SAH patient transferred to [facility] cone for further wokrup and management per neurosurgery.   Past Medical History  HTN NIDDM Hypothyroidism chornic etoh abuse  Significant Hospital Events   Patient intubated on 7/18 Patient transferred to Neurosurgery on 7/19  Consults:  Neurology Neurosurgery PCCM  Procedures:  Intubation   Significant Diagnostic Tests:  CT head on 7/18 > Extensive intracranial hemorrhage as described, likely traumatic. The possibility of an aneurysm is not entirely excluded and cannot be evaluated on this noncontrast CT. No significant mass effect. No midline shift. Close follow-up recommended. No acute/traumatic cervical spine pathology. Large left temporal and parietal scalp hematoma.  CT angiogram head 7/19 > No increase in post traumatic subarachnoid hemorrhage. No development of intraparenchymal hemorrhage, mass effect or shift. No primary vascular pathology is seen. No evidence of aneurysm or vascular malformation. There may be mild spasm in the anterior circulation vessels on the left.  Micro Data:  BCX UCX Covid negative  Antimicrobials:  Per primary team  Interim history/subjective:  No changes overnight  Objective   Blood pressure 130/70, pulse (!)  101, temperature 98.6 F (37 C), resp. rate (!) 21, height 5\' 3"  (1.6 m), weight 60.8 kg, SpO2 100 %.    Vent Mode: PSV;CPAP FiO2 (%):  [30 %-40 %] 30 % Set Rate:  [18 bmp] 18 bmp Vt Set:  [420 mL] 420 mL PEEP:  [5 cmH20] 5 cmH20 Pressure Support:  [5 cmH20] 5 cmH20 Plateau Pressure:  [12 cmH20-17 cmH20] 13 cmH20   Intake/Output Summary (Last 24 hours) at 05/01/2019 0850 Last data filed at 05/01/2019 0700 Gross per 24 hour  Intake 4683.92 ml  Output 1425 ml  Net 3258.92 ml   Filed Weights   04/29/19 2353 05/01/19 0418  Weight: 54.4 kg 60.8 kg    Examination: General:  Middle aged female on vent Neuro:  Sedated HEENT:  Reserve/AT, No JVD noted, PERRL. Marker line denoting hematoma, hematoma seems to have resolved.  Cardiovascular:  RRR, no MRG Lungs:  Clear Abdomen:  Soft, non-distended Musculoskeletal:  No acute deformity Skin:  Intact, MMM   Resolved Hospital Problem list     Assessment & Plan:   Subarachnoid hemorrhage - CTA without aneurysm History of seizures on Keppra - Interventions per neurosurgery - On Keppra  History of hypertension - Goal BP as determined by neurology  Respiratory sufficiency - Full vent - SBT today. Hopeful for extubation.  - Continue aspiration precautions  Fever. No leukocytosis.  - Follow cultures - Continue broad-spectrum antibiotic - Low threshold to DC. Check PCT.   Non-gap acidosis - hold off Bicarb for now, may just be compensatory from hyperventilation on vent.   Hyperglycemia - SSI scale increased  Best practice:  Diet: Tube feed held anticipating extubation.  Pain/Anxiety/Delirium protocol (if indicated): Propfol and fentanyl VAP protocol (if indicated): HOB at 30 DVT prophylaxis: Contraindicated for now GI prophylaxis: Pepcid Glucose control: Every 4 hours with correctional Mobility: Bedrest Code Status: Full per primary Family Communication: Wade Disposition: ICU  Labs   CBC: Recent Labs  Lab 04/29/19  2347 04/30/19 0721 05/01/19 0440  WBC 9.4  --  8.2  NEUTROABS 4.6  --  5.5  HGB 12.2 10.2* 9.3*  HCT 38.4 30.0* 29.2*  MCV 100.0  --  102.8*  PLT 145*  --  83*    Basic Metabolic Panel: Recent Labs  Lab 04/29/19 2347 04/30/19 0721 04/30/19 1037 04/30/19 1651 05/01/19 0440  NA 138 141  --   --  139  K 3.0* 2.8*  --   --  3.6  CL 105  --   --   --  113*  CO2 22  --   --   --  18*  GLUCOSE 232*  --   --   --  233*  BUN 22  --   --   --  20  CREATININE 1.12*  --   --   --  0.87  CALCIUM 9.1  --   --   --  8.0*  MG  --   --  1.5* 1.6* 1.5*  PHOS  --   --  3.1 2.6 2.8   GFR: Estimated Creatinine Clearance: 54 mL/min (by C-G formula based on SCr of 0.87 mg/dL). Recent Labs  Lab 04/29/19 2347 04/30/19 0215 04/30/19 1037 04/30/19 1333 05/01/19 0440  WBC 9.4  --   --   --  8.2  LATICACIDVEN 2.0* 2.4* 3.4* 3.3*  --     Liver Function Tests: Recent Labs  Lab 04/29/19 2347  AST 67*  ALT 49*  ALKPHOS 64  BILITOT 1.8*  PROT 8.6*  ALBUMIN 4.9   No results for input(s): LIPASE, AMYLASE in the last 168 hours. No results for input(s): AMMONIA in the last 168 hours.  ABG    Component Value Date/Time   PHART 7.406 04/30/2019 0721   PCO2ART 27.8 (L) 04/30/2019 0721   PO2ART 198.0 (H) 04/30/2019 0721   HCO3 17.3 (L) 04/30/2019 0721   TCO2 18 (L) 04/30/2019 0721   ACIDBASEDEF 6.0 (H) 04/30/2019 0721   O2SAT 100.0 04/30/2019 0721     Coagulation Profile: Recent Labs  Lab 04/29/19 2347  INR 1.2    Cardiac Enzymes: No results for input(s): CKTOTAL, CKMB, CKMBINDEX, TROPONINI in the last 168 hours.  HbA1C: Hgb A1c MFr Bld  Date/Time Value Ref Range Status  04/30/2019 10:37 AM 6.6 (H) 4.8 - 5.6 % Final    Comment:    (NOTE) Pre diabetes:          5.7%-6.4% Diabetes:              >6.4% Glycemic control for   <7.0% adults with diabetes   02/05/2019 11:33 PM 11.1 (H) 4.8 - 5.6 % Final    Comment:    (NOTE) Pre diabetes:          5.7%-6.4% Diabetes:               >6.4% Glycemic control for   <7.0% adults with diabetes     CBG: Recent Labs  Lab 04/30/19 1531 04/30/19 2019 04/30/19 2326 05/01/19 0407 05/01/19 0756  GLUCAP 183* 155* 144* 162* 197*     Joneen Roach, AGACNP-BC [facility] Pager [phone] or [phone]  05/01/2019 9:10 AM

## 2019-05-01 NOTE — Progress Notes (Signed)
Transcranial dopplers have been completed. Preliminary results can be found in CV Proc through chart review.   05/01/19 12:17 PM Shannon Herrera RVT

## 2019-05-02 LAB — BASIC METABOLIC PANEL
Anion gap: 8 (ref 5–15)
BUN: 11 mg/dL (ref 8–23)
CO2: 16 mmol/L — ABNORMAL LOW (ref 22–32)
Calcium: 8 mg/dL — ABNORMAL LOW (ref 8.9–10.3)
Chloride: 116 mmol/L — ABNORMAL HIGH (ref 98–111)
Creatinine, Ser: 0.92 mg/dL (ref 0.44–1.00)
GFR calc Af Amer: >60 mL/min (ref >60)
GFR calc non Af Amer: >60 mL/min (ref >60)
Glucose, Bld: 157 mg/dL — ABNORMAL HIGH (ref 70–99)
Potassium: 2.9 mmol/L — ABNORMAL LOW (ref 3.5–5.1)
Sodium: 140 mmol/L (ref 135–145)

## 2019-05-02 LAB — CBC
HCT: 26.4 % — ABNORMAL LOW (ref 36.0–46.0)
Hemoglobin: 8.5 g/dL — ABNORMAL LOW (ref 12.0–15.0)
MCH: 32.4 pg (ref 26.0–34.0)
MCHC: 32.2 g/dL (ref 30.0–36.0)
MCV: 100.8 fL — ABNORMAL HIGH (ref 80.0–100.0)
Platelets: 61 10*3/uL — ABNORMAL LOW (ref 150–400)
RBC: 2.62 MIL/uL — ABNORMAL LOW (ref 3.87–5.11)
RDW: 13.7 % (ref 11.5–15.5)
WBC: 6.9 10*3/uL (ref 4.0–10.5)
nRBC: 0 % (ref 0.0–0.2)

## 2019-05-02 LAB — GLUCOSE, CAPILLARY
Glucose-Capillary: 111 mg/dL — ABNORMAL HIGH (ref 70–99)
Glucose-Capillary: 122 mg/dL — ABNORMAL HIGH (ref 70–99)
Glucose-Capillary: 133 mg/dL — ABNORMAL HIGH (ref 70–99)
Glucose-Capillary: 165 mg/dL — ABNORMAL HIGH (ref 70–99)
Glucose-Capillary: 180 mg/dL — ABNORMAL HIGH (ref 70–99)
Glucose-Capillary: 215 mg/dL — ABNORMAL HIGH (ref 70–99)

## 2019-05-02 LAB — URINE CULTURE: Culture: 10000 — AB

## 2019-05-02 LAB — PROCALCITONIN: Procalcitonin: 3.28 ng/mL

## 2019-05-02 MED ORDER — ACETAMINOPHEN 500 MG PO TABS: 1000.0000 mg | ORAL_TABLET | Freq: Four times a day (QID) | ORAL | Status: DC | PRN

## 2019-05-02 MED ORDER — FOLIC ACID 1 MG PO TABS
1.0000 mg | ORAL_TABLET | Freq: Every day | ORAL | Status: DC
  Administered 2019-05-02 – 2019-05-06 (×5): 1 mg via ORAL
  Filled 2019-05-02 (×4): qty 1

## 2019-05-02 MED ORDER — POTASSIUM CHLORIDE CRYS ER 20 MEQ PO TBCR
40.0000 meq | EXTENDED_RELEASE_TABLET | Freq: Three times a day (TID) | ORAL | Status: AC
  Administered 2019-05-02 (×2): 40 meq via ORAL
  Filled 2019-05-02 (×2): qty 2

## 2019-05-02 MED ORDER — PANTOPRAZOLE SODIUM 40 MG PO TBEC: 40.0000 mg | DELAYED_RELEASE_TABLET | Freq: Every day | ORAL | Status: DC

## 2019-05-02 MED ORDER — VITAMIN B-1 100 MG PO TABS
100.0000 mg | ORAL_TABLET | Freq: Every day | ORAL | Status: DC
  Administered 2019-05-03 – 2019-05-06 (×4): 100 mg via ORAL
  Filled 2019-05-02 (×4): qty 1

## 2019-05-02 MED ORDER — ADULT MULTIVITAMIN W/MINERALS CH
1.0000 | ORAL_TABLET | Freq: Every day | ORAL | Status: DC
  Administered 2019-05-02 – 2019-05-06 (×5): 1 via ORAL
  Filled 2019-05-02 (×5): qty 1

## 2019-05-02 NOTE — Progress Notes (Signed)
Assisted tele visit to patient with family member.  Charlotte Brafford P, RN  

## 2019-05-02 NOTE — Evaluation (Signed)
Occupational Therapy Evaluation Patient Details Name: Shannon Herrera MRN: [id number] DOB: [birth year] Today's Date: 05/02/2019    History of Present Illness 63 yo female admitted due to seizure activity with ETOH abuse. Pt intubated at [facility] and transfered to [facility] with extubation 05/01/19.  Pt has L temporal and parietal scalp hematoma  and BIL SAH. PMH: hx of seizure, encephalopathy 01/2019, HTN, NIDDM, Hypothyroidism, multiple falls   Clinical Impression   PT admitted with seizures with BIL SAH. Pt currently with functional limitiations due to the deficits listed below (see OT problem list). Pt currently with LOB with basic transfers requiring min (A). Pt denies any falls this year but chart review shows admission in April 2020 with multiple falls. Pt Ox4 and recalling information with 5 minute delay. Pt reports feeling unsteady showing awareness to balance changes during transfer.  Pt will benefit from skilled OT to increase their independence and safety with adls and balance to allow discharge outpatient. Pt reports son can drive her.     Follow Up Recommendations  Outpatient OT    Equipment Recommendations  None recommended by OT    Recommendations for Other Services Other (comment)(consulting for ETOH use - previous admission in april )     Precautions / Restrictions Precautions Precautions: Fall Precaution Comments: seizures Restrictions Weight Bearing Restrictions: No      Mobility Bed Mobility Overal bed mobility: Modified Independent                Transfers Overall transfer level: Needs assistance   Transfers: Sit to/from Stand Sit to Stand: Min guard         General transfer comment: reliant on bil UE    Balance Overall balance assessment: Needs assistance Sitting-balance support: Bilateral upper extremity supported;Feet supported Sitting balance-Leahy Scale: Good     Standing balance support: No upper extremity supported Standing balance-Leahy Scale:  Fair                             ADL either performed or assessed with clinical judgement   ADL Overall ADL's : Needs assistance/impaired Eating/Feeding: NPO   Grooming: Wash/dry hands;Standing;Supervision/safety Grooming Details (indicate cue type and reason): needed uses to add soap to sequence for hyigene              Lower Body Dressing: Supervision/safety;Sitting/lateral leans Lower Body Dressing Details (indicate cue type and reason): pt able to figure 4 cross to don socks Toilet Transfer: Min guard;Ambulation   Toileting- Clothing Manipulation and Hygiene: Min guard;Sit to/from stand       Functional mobility during ADLs: Minimal assistance General ADL Comments: Pt progressed from bed to toilet to sink to hallway this session and back into room to sit up in recliner. pt wears glasses for reading and reports that currentl L eye is harder to see out of witih light sensitivity.      Vision Baseline Vision/History: Wears glasses Wears Glasses: Reading only Patient Visual Report: Other (comment) Vision Assessment?: No apparent visual deficits Additional Comments: reports light sensitive and decr ability to see out of eye to due edema. denies blurred or double vision     Perception     Praxis      Pertinent Vitals/Pain Pain Assessment: No/denies pain     Hand Dominance Right   Extremity/Trunk Assessment Upper Extremity Assessment Upper Extremity Assessment: Overall WFL for tasks assessed   Lower Extremity Assessment Lower Extremity Assessment: Defer to PT evaluation  Cervical / Trunk Assessment Cervical / Trunk Assessment: Normal   Communication Communication Communication: No difficulties   Cognition Arousal/Alertness: Awake/alert Behavior During Therapy: WFL for tasks assessed/performed Overall Cognitive Status: No family/caregiver present to determine baseline cognitive functioning                                 General  Comments: pt AOx4 able to recall 3 words after > 5 minutes. pt with quick brisk responses to questions. Question accuracy due to last admission in april does not match current history   General Comments  head turns and looking R to L with LOB. pt extending arms to self correct. pt reports feeling unsteady    Exercises     Shoulder Instructions      Home Living Family/patient expects to be discharged to:: Private residence Living Arrangements: Alone Available Help at Discharge: Family;Available PRN/intermittently Type of Home: Apartment Home Access: Stairs to enter Entrance Stairs-Number of Steps: 3   Home Layout: One level     Bathroom Shower/Tub: Chief Strategy Officer: Standard     Home Equipment: None   Additional Comments: Pt reports that hx of "friend" is complicated but does not give details,. Pt reports that son Sharlet [place] can help and works daily with a schedule that varies      Prior Functioning/Environment Level of Independence: Independent        Comments: Lives with cat named "poof". Works in home care. Independent for ADLs. Son stops by to check on her. Drives.        OT Problem List: Decreased activity tolerance;Decreased cognition;Decreased knowledge of precautions;Decreased safety awareness;Impaired balance (sitting and/or standing);Impaired vision/perception      OT Treatment/Interventions: Self-care/ADL training;Therapeutic activities;Therapeutic exercise;Balance training;Patient/family education;Visual/perceptual remediation/compensation;Cognitive remediation/compensation    OT Goals(Current goals can be found in the care plan section) Acute Rehab OT Goals Patient Stated Goal: to get something to drink OT Goal Formulation: With patient Time For Goal Achievement: 05/16/19 Potential to Achieve Goals: Good  OT Frequency: Min 3X/week   Barriers to D/C: Other (comment)  lives alone with PRN in (A)        Co-evaluation PT/OT/SLP  Co-Evaluation/Treatment: Yes Reason for Co-Treatment: For patient/therapist safety;To address functional/ADL transfers   OT goals addressed during session: ADL's and self-care;Proper use of Adaptive equipment and DME      AM-PAC OT "6 Clicks" Daily Activity     Outcome Measure Help from another person eating meals?: None Help from another person taking care of personal grooming?: None Help from another person toileting, which includes using toliet, bedpan, or urinal?: None Help from another person bathing (including washing, rinsing, drying)?: A Little Help from another person to put on and taking off regular upper body clothing?: None Help from another person to put on and taking off regular lower body clothing?: None 6 Click Score: 23   End of Session Equipment Utilized During Treatment: Gait belt Nurse Communication: Mobility status;Precautions  Activity Tolerance: Patient tolerated treatment well Patient left: in chair;with call bell/phone within reach;with nursing/sitter in room;with chair alarm set  OT Visit Diagnosis: Unsteadiness on feet (R26.81)                Time: 9629-5284 OT Time Calculation (min): 21 min Charges:  OT General Charges $OT Visit: 1 Visit OT Evaluation $OT Eval Moderate Complexity: 1 Mod   Mateo Flow, OTR/L  Acute Rehabilitation Services Pager: [phone] Office: [phone] .  Mateo Flow 05/02/2019, 9:18 AM

## 2019-05-02 NOTE — Progress Notes (Signed)
Subjective: Patient reports No complaints this morning  Objective: Vital signs in last 24 hours: Temp:  [98.9 F (37.2 C)-101.1 F (38.4 C)] 99.6 F (37.6 C) (07/21 0400) Pulse Rate:  [94-114] 100 (07/21 0600) Resp:  [11-25] 16 (07/21 0600) BP: (92-141)/(47-78) 114/51 (07/21 0600) SpO2:  [97 %-100 %] 99 % (07/21 0600) FiO2 (%):  [30 %] 30 % (07/20 1200) Weight:  [61.5 kg] 61.5 kg (07/21 0420)  Intake/Output from previous day: 07/20 0701 - 07/21 0700 In: 2659.6 [P.O.:15; I.V.:1491.1; NG/GT:659.7; IV Piggyback:493.8] Out: 1250 [Urine:1250] Intake/Output this shift: No intake/output data recorded.  Awake alert moves all extremities well oriented x3  Lab Results: Recent Labs    05/01/19 0440 05/02/19 0420  WBC 8.2 6.9  HGB 9.3* 8.5*  HCT 29.2* 26.4*  PLT 83* 61*   BMET Recent Labs    05/01/19 0440 05/02/19 0420  NA 139 140  K 3.6 2.9*  CL 113* 116*  CO2 18* 16*  GLUCOSE 233* 157*  BUN 20 11  CREATININE 0.87 0.92  CALCIUM 8.0* 8.0*    Studies/Results: Ct Angio Head W Or Wo Contrast  Result Date: 04/30/2019 CLINICAL DATA:  Follow-up post traumatic subarachnoid hemorrhage. EXAM: CT ANGIOGRAPHY HEAD AND NECK TECHNIQUE: Multidetector CT imaging of the head and neck was performed using the standard protocol during bolus administration of intravenous contrast. Multiplanar CT image reconstructions and MIPs were obtained to evaluate the vascular anatomy. Carotid stenosis measurements (when applicable) are obtained utilizing NASCET criteria, using the distal internal carotid diameter as the denominator. CONTRAST:  85mL OMNIPAQUE IOHEXOL 350 MG/ML SOLN COMPARISON:  Head CT yesterday.  CT angiography 02/05/2019 FINDINGS: CT HEAD FINDINGS Brain: Post traumatic subarachnoid hemorrhage in the posterior fossa cisterns and in the supratentorial subarachnoid spaces left more than right has not increased in volume. No evidence developing subdural or epidural collection. No evidence of  intraparenchymal hemorrhage. No mass effect or shift. Vascular: See below. Skull: No displaced skull fracture. Cannot rule out minimal fracture in the region of the outer table coronal suture on the left. Sinuses: Mucosal thickening and some layering fluid on the right. Orbits: Negative Review of the MIP images confirms the above findings CTA NECK FINDINGS Aortic arch: Normal Right carotid system: Normal Left carotid system: Atherosclerotic calcification at the bifurcation and proximal ICA bulb but no stenosis. Vertebral arteries: Normal Skeleton: Mid cervical spondylosis.  No traumatic finding. Other neck: Normal.  Intubated.  Orogastric tube. Upper chest: Mild dependent atelectasis on the right. Review of the MIP images confirms the above findings CTA HEAD FINDINGS Anterior circulation: Both internal carotid arteries are widely patent through the skull base and siphon regions. The anterior and middle cerebral vessels are patent without evidence of aneurysm or vascular malformation. There may be mild spasm on the left. Posterior circulation: Both vertebral arteries are patent to the basilar. No basilar stenosis. Posterior circulation branch vessels are patent. Large posterior communicating arteries on each side. Venous sinuses: Patent and normal. Anatomic variants: None significant. Delayed phase: Not performed. Review of the MIP images confirms the above findings IMPRESSION: No increase in post traumatic subarachnoid hemorrhage. No development of intraparenchymal hemorrhage, mass effect or shift. No primary vascular pathology is seen. No evidence of aneurysm or vascular malformation. There may be mild spasm in the anterior circulation vessels on the left. Electronically Signed   By: Nelson Chimes M.D.   On: 04/30/2019 09:04   Ct Angio Neck W Or Wo Contrast  Result Date: 04/30/2019 CLINICAL DATA:  Follow-up post  traumatic subarachnoid hemorrhage. EXAM: CT ANGIOGRAPHY HEAD AND NECK TECHNIQUE: Multidetector CT  imaging of the head and neck was performed using the standard protocol during bolus administration of intravenous contrast. Multiplanar CT image reconstructions and MIPs were obtained to evaluate the vascular anatomy. Carotid stenosis measurements (when applicable) are obtained utilizing NASCET criteria, using the distal internal carotid diameter as the denominator. CONTRAST:  50mL OMNIPAQUE IOHEXOL 350 MG/ML SOLN COMPARISON:  Head CT yesterday.  CT angiography 02/05/2019 FINDINGS: CT HEAD FINDINGS Brain: Post traumatic subarachnoid hemorrhage in the posterior fossa cisterns and in the supratentorial subarachnoid spaces left more than right has not increased in volume. No evidence developing subdural or epidural collection. No evidence of intraparenchymal hemorrhage. No mass effect or shift. Vascular: See below. Skull: No displaced skull fracture. Cannot rule out minimal fracture in the region of the outer table coronal suture on the left. Sinuses: Mucosal thickening and some layering fluid on the right. Orbits: Negative Review of the MIP images confirms the above findings CTA NECK FINDINGS Aortic arch: Normal Right carotid system: Normal Left carotid system: Atherosclerotic calcification at the bifurcation and proximal ICA bulb but no stenosis. Vertebral arteries: Normal Skeleton: Mid cervical spondylosis.  No traumatic finding. Other neck: Normal.  Intubated.  Orogastric tube. Upper chest: Mild dependent atelectasis on the right. Review of the MIP images confirms the above findings CTA HEAD FINDINGS Anterior circulation: Both internal carotid arteries are widely patent through the skull base and siphon regions. The anterior and middle cerebral vessels are patent without evidence of aneurysm or vascular malformation. There may be mild spasm on the left. Posterior circulation: Both vertebral arteries are patent to the basilar. No basilar stenosis. Posterior circulation branch vessels are patent. Large posterior  communicating arteries on each side. Venous sinuses: Patent and normal. Anatomic variants: None significant. Delayed phase: Not performed. Review of the MIP images confirms the above findings IMPRESSION: No increase in post traumatic subarachnoid hemorrhage. No development of intraparenchymal hemorrhage, mass effect or shift. No primary vascular pathology is seen. No evidence of aneurysm or vascular malformation. There may be mild spasm in the anterior circulation vessels on the left. Electronically Signed   By: Paulina Fusi M.D.   On: 04/30/2019 09:04   Dg Chest Port 1 View  Result Date: 05/01/2019 CLINICAL DATA:  Patient status post subarachnoid hemorrhage 04/29/2019. Intubated. EXAM: PORTABLE CHEST 1 VIEW COMPARISON:  Single-view of the chest 04/30/2019. FINDINGS: Support tubes and lines are unchanged and project in good position. Lungs are clear. No pneumothorax or pleural fluid. Heart size is normal. IMPRESSION: Support tubes and lines projecting good position.  Lungs are clear. Electronically Signed   By: Drusilla Kanner M.D.   On: 05/01/2019 08:07   Dg Chest Port 1 View  Result Date: 04/30/2019 CLINICAL DATA:  Central line placement EXAM: PORTABLE CHEST 1 VIEW COMPARISON:  April 30, 2019 FINDINGS: The right-sided central venous catheter is well position with the tip terminating near the cavoatrial junction. The endotracheal tube terminates approximately 3.8 cm above the carina. The enteric tube extends below the left hemidiaphragm. The heart size is stable. There is no pneumothorax. No large pleural effusion. No focal infiltrate. IMPRESSION: 1. Lines and tubes as above.  No right-sided pneumothorax. 2. Otherwise, stable appearance of the chest. Electronically Signed   By: Katherine Mantle M.D.   On: 04/30/2019 19:40   Dg Chest Port 1 View  Result Date: 04/30/2019 CLINICAL DATA:  Intubated. EXAM: PORTABLE CHEST 1 VIEW COMPARISON:  Yesterday. FINDINGS: Endotracheal tube  tip 2.8 cm above the  carina. Nasogastric tube extending into the stomach with the side hole in the proximal to mid stomach and tip not included. Normal sized heart. Clear lungs with normal vascularity. Thoracic spine degenerative changes. IMPRESSION: 1. No acute abnormality. 2. Endotracheal tube tip 2.8 cm above the carina. This could be retracted 4 cm to place it at the level of the clavicles. Electronically Signed   By: Beckie SaltsSteven  Reid M.D.   On: 04/30/2019 10:53   Vas [place] Transcranial Doppler  Result Date: 05/01/2019  Transcranial Doppler Indications: Subarachnoid hemorrhage. Limitations: Patient positioning, patient somnolence, respiratory disturbance Limitations for diagnostic windows: Unable to insonate left transtemporal window. Comparison Study: No prior studies. Performing Technologist: Chanda BusingGregory Collins RVT  Examination Guidelines: A complete evaluation includes B-mode imaging, spectral Doppler, color Doppler, and power Doppler as needed of all accessible portions of each vessel. Bilateral testing is considered an integral part of a complete examination. Limited examinations for reoccurring indications may be performed as noted.  +----------+------------+---------+-----------+--------------------------------+ RIGHT TCD   Right VM    Depth  Pulsatility            Comment                            (cm)      (cm)                                               +----------+------------+---------+-----------+--------------------------------+ MCA          37.00                1.33                                     +----------+------------+---------+-----------+--------------------------------+ ACA                                             Unable to visualize        +----------+------------+---------+-----------+--------------------------------+ Term ICA                                        Unable to visualize        +----------+------------+---------+-----------+--------------------------------+ PCA           25.00                1.33                                     +----------+------------+---------+-----------+--------------------------------+ Opthalmic    17.00                1.82                                     +----------+------------+---------+-----------+--------------------------------+ ICA siphon   24.00                1.55                                     +----------+------------+---------+-----------+--------------------------------+  Vertebral    -25.00               1.38                                     +----------+------------+---------+-----------+--------------------------------+ Distal ICA                                   Unable to visualize due to                                                          bandages             +----------+------------+---------+-----------+--------------------------------+  +----------+------------+----------+-----------+-------------------+ LEFT TCD  Left VM (cm)Depth (cm)Pulsatility      Comment       +----------+------------+----------+-----------+-------------------+ MCA                                        Unable to visualize +----------+------------+----------+-----------+-------------------+ ACA                                        Unable to visualize +----------+------------+----------+-----------+-------------------+ Term ICA                                   Unable to visualize +----------+------------+----------+-----------+-------------------+ PCA                                        Unable to visualize +----------+------------+----------+-----------+-------------------+ Opthalmic    19.00                 1.69                        +----------+------------+----------+-----------+-------------------+ ICA siphon   28.00                 1.54                        +----------+------------+----------+-----------+-------------------+ Vertebral    -32.00                  1.3                        +----------+------------+----------+-----------+-------------------+ Distal ICA   100.00                                            +----------+------------+----------+-----------+-------------------+  +------------+-------+-------------------+             VM cm/s      Comment       +------------+-------+-------------------+ Prox Basilar-41.00        1.56         +------------+-------+-------------------+ Dist  Basilar       Unable to visualize +------------+-------+-------------------+ Summary:  Absent left temporal and poor right temporal windows limit evaluation. Elevated distal left ICA mean flow velocities suggest mild vasospasm.Normal mean flow velocities in posterior circulation vessels. *See table(s) above for measurements and observations.  Diagnosing physician: Delia HeadyPramod Sethi MD Electronically signed by Delia HeadyPramod Sethi MD on 05/01/2019 at 1:01:01 PM.    Final     Assessment/Plan: Post bleed day 3 hospital day 2 subarachnoid hemorrhage patient doing well no complaints this morning continue to mobilize with physical occupational therapy and speech therapy  LOS: 2 days     Bertie Simien P 05/02/2019, 7:51 AM

## 2019-05-02 NOTE — Progress Notes (Signed)
  Progress Note   Date: 05/02/2019  Patient Name: Shannon Herrera        MRN#: [id number]   Clarification of diagnosis:  Patient had a hypoxemic respiratory failure

## 2019-05-02 NOTE — Progress Notes (Signed)
NAME:  TEDDIE HELVEY, MRN:  [id number], DOB:  01-Mar-1956, LOS: 2 ADMISSION DATE:  04/29/2019, CONSULTATION DATE:  04/30/19  CHIEF COMPLAINT:  SAH  Brief History   63 yo with hx of HTN, NIDDM, Hypothyroidism, and chornic etoh abuse presents to outside ED with possible seiure activity per family.   Past Medical History  HTN NIDDM Hypothyroidism chornic etoh abuse  Significant Hospital Events   Patient intubated on 7/18 Patient transferred to Neurosurgery on 7/19  Consults:  Neurology Neurosurgery PCCM  Procedures:  Intubation > 7/20   Significant Diagnostic Tests:  CT head on 7/18 > Extensive intracranial hemorrhage as described, likely traumatic. The possibility of an aneurysm is not entirely excluded and cannot be evaluated on this noncontrast CT. No significant mass effect. No midline shift. Close follow-up recommended. No acute/traumatic cervical spine pathology. Large left temporal and parietal scalp hematoma.  CT angiogram head 7/19 > No increase in post traumatic subarachnoid hemorrhage. No development of intraparenchymal hemorrhage, mass effect or shift. No primary vascular pathology is seen. No evidence of aneurysm or vascular malformation. There may be mild spasm in the anterior circulation vessels on the left.  Micro Data:  BCX UCX Covid negative  Antimicrobials:  Cefepime and vancomycin  Interim history/subjective:  Extubated yesterday. Tolerating well.   Objective   Blood pressure (!) 128/59, pulse (!) 102, temperature 99.6 F (37.6 C), temperature source Axillary, resp. rate 15, height 5\' 3"  (1.6 m), weight 61.5 kg, SpO2 98 %.    FiO2 (%):  [30 %] 30 %   Intake/Output Summary (Last 24 hours) at 05/02/2019 0842 Last data filed at 05/02/2019 0800 Gross per 24 hour  Intake 2203.9 ml  Output 750 ml  Net 1453.9 ml   Filed Weights   04/29/19 2353 05/01/19 0418 05/02/19 0420  Weight: 54.4 kg 60.8 kg 61.5 kg    Examination:  General:  Middle aged  female in NAD Neuro:  Sedated HEENT: No JVD noted, PERRL. Ecchymosis to L periorbit.  Cardiovascular:  RRR, no MRG Lungs:  Clear Abdomen:  Soft, non-distended, non-tender Musculoskeletal:  No acute deformity or ROM limitation Skin:  Intact, MMM   Resolved Hospital Problem list    Respiratory insufficiency  Assessment & Plan:   Subarachnoid hemorrhage - CTA without aneurysm.  History of seizures on Keppra - Neurosurgery primary - keppra  History of hypertension - Goal BP as determined by neurology  Fever. No leukocytosis.  - DC antibiotics  Non-gap acidosis: worse today. Etiology unclear - Consider Bicarb  Hyperglycemia - SSI scale increased  Hypokalemia - replace  Best practice:  Diet: SLP to see Pain/Anxiety/Delirium protocol (if indicated):NA VAP protocol (if indicated): NA DVT prophylaxis: Per neurosurgery GI prophylaxis: Pepcid Glucose control: SSI Mobility: Bedrest Code Status: Full Family Communication: Wade updated Disposition: ICU  Labs   CBC: Recent Labs  Lab 04/29/19 2347 04/30/19 0721 05/01/19 0440 05/02/19 0420  WBC 9.4  --  8.2 6.9  NEUTROABS 4.6  --  5.5  --   HGB 12.2 10.2* 9.3* 8.5*  HCT 38.4 30.0* 29.2* 26.4*  MCV 100.0  --  102.8* 100.8*  PLT 145*  --  83* 61*    Basic Metabolic Panel: Recent Labs  Lab 04/29/19 2347 04/30/19 0721 04/30/19 1037 04/30/19 1651 05/01/19 0440 05/01/19 1510 05/02/19 0420  NA 138 141  --   --  139  --  140  K 3.0* 2.8*  --   --  3.6  --  2.9*  CL 105  --   --   --  113*  --  116*  CO2 22  --   --   --  18*  --  16*  GLUCOSE 232*  --   --   --  233*  --  157*  BUN 22  --   --   --  20  --  11  CREATININE 1.12*  --   --   --  0.87  --  0.92  CALCIUM 9.1  --   --   --  8.0*  --  8.0*  MG  --   --  1.5* 1.6* 1.5* 1.9  --   PHOS  --   --  3.1 2.6 2.8 2.6  --    GFR: Estimated Creatinine Clearance: 51.1 mL/min (by C-G formula based on SCr of 0.92 mg/dL). Recent Labs  Lab 04/29/19 2347  04/30/19 0215 04/30/19 1037 04/30/19 1333 05/01/19 0440 05/01/19 1510 05/02/19 0420  PROCALCITON  --   --   --   --   --   --  3.28  WBC 9.4  --   --   --  8.2  --  6.9  LATICACIDVEN 2.0* 2.4* 3.4* 3.3*  --  0.9  --     Liver Function Tests: Recent Labs  Lab 04/29/19 2347  AST 67*  ALT 49*  ALKPHOS 64  BILITOT 1.8*  PROT 8.6*  ALBUMIN 4.9   No results for input(s): LIPASE, AMYLASE in the last 168 hours. No results for input(s): AMMONIA in the last 168 hours.  ABG    Component Value Date/Time   PHART 7.406 04/30/2019 0721   PCO2ART 27.8 (L) 04/30/2019 0721   PO2ART 198.0 (H) 04/30/2019 0721   HCO3 17.3 (L) 04/30/2019 0721   TCO2 18 (L) 04/30/2019 0721   ACIDBASEDEF 6.0 (H) 04/30/2019 0721   O2SAT 100.0 04/30/2019 0721     Coagulation Profile: Recent Labs  Lab 04/29/19 2347  INR 1.2    Cardiac Enzymes: No results for input(s): CKTOTAL, CKMB, CKMBINDEX, TROPONINI in the last 168 hours.  HbA1C: Hgb A1c MFr Bld  Date/Time Value Ref Range Status  04/30/2019 10:37 AM 6.6 (H) 4.8 - 5.6 % Final    Comment:    (NOTE) Pre diabetes:          5.7%-6.4% Diabetes:              >6.4% Glycemic control for   <7.0% adults with diabetes   02/05/2019 11:33 PM 11.1 (H) 4.8 - 5.6 % Final    Comment:    (NOTE) Pre diabetes:          5.7%-6.4% Diabetes:              >6.4% Glycemic control for   <7.0% adults with diabetes     CBG: Recent Labs  Lab 05/01/19 1541 05/01/19 1942 05/01/19 2322 05/02/19 0315 05/02/19 0755  GLUCAP 143* 129* 129* 133* 122*     Joneen Roach, AGACNP-BC [facility] Pulmonary/Critical Care Pager [phone] or [phone]  05/02/2019 8:42 AM

## 2019-05-02 NOTE — Evaluation (Signed)
Physical Therapy Evaluation Patient Details Name: Shannon Herrera MRN: [id number] DOB: 07-05-1956 Today's Date: 05/02/2019   History of Present Illness  63 yo female admitted due to seizure activity with ETOH abuse. Pt intubated at [facility] and transfered to [facility] with extubation 05/01/19.  Pt has L temporal and parietal scalp hematoma  and BIL SAH. PMH: hx of seizure, encephalopathy 01/2019, HTN, NIDDM, Hypothyroidism, multiple falls  Clinical Impression  Patient presents with generalized weakness, impaired balance and impaired mobility s/p above. 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 Question reliability of reported PLOF/history as per admission in April, pt with multiple falls and AMS and inconsistencies with home setup/support. Pt tolerated gait training with min A for balance/safety. Able to recall 3 words within session after 5 mins. Will follow acutely to maximize independence and mobility prior to return home. Recommend further cognitive assessment and OPPT for higher level balance as pt lives alone.     Follow Up Recommendations Outpatient PT;Supervision - Intermittent    Equipment Recommendations  None recommended by PT    Recommendations for Other Services       Precautions / Restrictions Precautions Precautions: Fall Precaution Comments: seizures; SBP <160 Restrictions Weight Bearing Restrictions: No      Mobility  Bed Mobility Overal bed mobility: Modified Independent             General bed mobility comments: HOB elevated.  Transfers Overall transfer level: Needs assistance Equipment used: None Transfers: Sit to/from Stand Sit to Stand: Min guard         General transfer comment: Min guard for safety. Stood from [employer], from toilet x1. transferred to chair post ambulation.  Ambulation/Gait Ambulation/Gait assistance: Min assist Gait Distance (Feet): 400 Feet Assistive device: None Gait  Pattern/deviations: Step-through pattern;Decreased stride length;Drifts right/left Gait velocity: decreased   General Gait Details: Slow, mildly unsteady gait with decreased foot clearance bilaterally; veers left and LOB requiring Min A to correct.  Stairs            Wheelchair Mobility    Modified Rankin (Stroke Patients Only) Modified Rankin (Stroke Patients Only) Pre-Morbid Rankin Score: No significant disability Modified Rankin: Moderately severe disability     Balance Overall balance assessment: Needs assistance Sitting-balance support: Bilateral upper extremity supported;Feet supported Sitting balance-Leahy Scale: Good     Standing balance support: No upper extremity supported;During functional activity Standing balance-Leahy Scale: Fair Standing balance comment: Min A at times during dynamic standing due to LOB, unsteadiness.                             Pertinent Vitals/Pain Pain Assessment: No/denies pain    Home Living Family/patient expects to be discharged to:: Private residence Living Arrangements: Alone Available Help at Discharge: Family;Available PRN/intermittently Type of Home: Apartment Home Access: Stairs to enter Entrance Stairs-Rails: None Entrance Stairs-Number of Steps: 3 Home Layout: One level Home Equipment: None Additional Comments: Pt reports that hx of "friend" is complicated but does not give details,. Pt reports that son Sharlet Salina can help and works daily with a schedule that varies    Prior Function Level of Independence: Independent         Comments: Lives with cat named "poof". Works in home care. Independent for ADLs. Son stops by to check on her. Drives.     Hand Dominance   Dominant Hand: Right    Extremity/Trunk Assessment   Upper Extremity  Assessment Upper Extremity Assessment: Defer to OT evaluation    Lower Extremity Assessment Lower Extremity Assessment: Generalized weakness    Cervical / Trunk  Assessment Cervical / Trunk Assessment: Normal  Communication   Communication: No difficulties  Cognition Arousal/Alertness: Awake/alert Behavior During Therapy: WFL for tasks assessed/performed Overall Cognitive Status: No family/caregiver present to determine baseline cognitive functioning                                 General Comments: pt AOx4 able to recall 3 words after > 5 minutes. pt with quick brisk responses to questions. Question accuracy due to last admission in april does not match current history      General Comments General comments (skin integrity, edema, etc.): Swelling present left eye; light and distraction affects eye per report.    Exercises     Assessment/Plan    PT Assessment Patient needs continued PT services  PT Problem List Decreased strength;Decreased mobility;Decreased balance       PT Treatment Interventions DME instruction;Therapeutic activities;Gait training;Therapeutic exercise;Patient/family education;Balance training;Functional mobility training;Stair training;Neuromuscular re-education    PT Goals (Current goals can be found in the Care Plan section)  Acute Rehab PT Goals Patient Stated Goal: to get something to drink PT Goal Formulation: With patient Time For Goal Achievement: 05/16/19 Potential to Achieve Goals: Good    Frequency Min 4X/week   Barriers to discharge Decreased caregiver support lives alone    Co-evaluation PT/OT/SLP Co-Evaluation/Treatment: Yes Reason for Co-Treatment: For patient/therapist safety;To address functional/ADL transfers PT goals addressed during session: Mobility/safety with mobility;Balance OT goals addressed during session: ADL's and self-care;Proper use of Adaptive equipment and DME       AM-PAC PT "6 Clicks" Mobility  Outcome Measure Help needed turning from your back to your side while in a flat bed without using bedrails?: None Help needed moving from lying on your back to  sitting on the side of a flat bed without using bedrails?: None Help needed moving to and from a bed to a chair (including a wheelchair)?: A Little Help needed standing up from a chair using your arms (e.g., wheelchair or bedside chair)?: A Little Help needed to walk in hospital room?: A Little Help needed climbing 3-5 steps with a railing? : A Little 6 Click Score: 20    End of Session Equipment Utilized During Treatment: Gait belt Activity Tolerance: Patient tolerated treatment well Patient left: in chair;with call bell/phone within reach;with chair alarm set;with nursing/sitter in room Nurse Communication: Mobility status PT Visit Diagnosis: Unsteadiness on feet (R26.81);Difficulty in walking, not elsewhere classified (R26.2)    Time: 2130-8657 PT Time Calculation (min) (ACUTE ONLY): 18 min   Charges:   PT Evaluation $PT Eval Moderate Complexity: 1 Mod          Mylo Red, PT, DPT Acute Rehabilitation Services Pager [phone] Office [phone]      Blake Divine A Lanier Ensign 05/02/2019, 9:27 AM

## 2019-05-02 NOTE — Progress Notes (Signed)
Speech Language Pathology Treatment: Dysphagia  Patient Details Name: Shannon Herrera MRN: [id number] DOB: 21-Jul-1956 Today's Date: 05/02/2019 Time: 7425-9563 SLP Time Calculation (min) (ACUTE ONLY): 17 min  Assessment / Plan / Recommendation Clinical Impression  Intermittent episodes of suspected airway intrusion as she said "think I got too much" indicated by immediate cough with cup sip juice in 1 of 7 trials. Affirms mild odonophagia following brief 2 day intubation. Single and/or 1-2 sips trials consumed without s/s aspiration. Recommend initiate Dys 3 given pharyngeal pain with upgrade predicted soon. Education re: penetration/aspiration suspicion, reasoning and strategies. Pt agreeable to avoid straws for several days. Follow up for safety and po advancement.     HPI HPI: Patient is a 63 y.o. female with PMH : DM, HTN, hypothyroid, who presented to the ED after being found unresponsive by her family. Seizure-like activity was noted by family at home, she has a h/o chronic ETOH abuse. She was intubated and sedated in ED at [facility] and transferred to [facility] for further care. She was extubated on 7/20.      SLP Plan  Continue with current plan of care       Recommendations  Diet recommendations: Dysphagia 3 (mechanical soft);Thin liquid Liquids provided via: Cup;No straw Medication Administration: Whole meds with liquid Supervision: Patient able to self feed;Intermittent supervision to cue for compensatory strategies Compensations: Slow rate;Small sips/bites Postural Changes and/or Swallow Maneuvers: Seated upright 90 degrees                Oral Care Recommendations: Oral care BID Follow up Recommendations: None SLP Visit Diagnosis: Dysphagia, unspecified (R13.10) Plan: Continue with current plan of care                      Royce Macadamia 05/02/2019, 9:42 AM   Breck Coons Lonell Face.Ed Nurse, children's [phone] Office  [phone]

## 2019-05-03 LAB — GLUCOSE, CAPILLARY
Glucose-Capillary: 147 mg/dL — ABNORMAL HIGH (ref 70–99)
Glucose-Capillary: 145 mg/dL — ABNORMAL HIGH (ref 70–99)
Glucose-Capillary: 210 mg/dL — ABNORMAL HIGH (ref 70–99)
Glucose-Capillary: 114 mg/dL — ABNORMAL HIGH (ref 70–99)
Glucose-Capillary: 210 mg/dL — ABNORMAL HIGH (ref 70–99)
Glucose-Capillary: 210 mg/dL — ABNORMAL HIGH (ref 70–99)

## 2019-05-03 LAB — PROCALCITONIN: Procalcitonin: 1.28 ng/mL

## 2019-05-03 NOTE — Progress Notes (Signed)
Subjective: Patient reports Patient doing well no headache  Objective: Vital signs in last 24 hours: Temp:  [98.2 F (36.8 C)-99.2 F (37.3 C)] 98.3 F (36.8 C) (07/22 1600) Pulse Rate:  [59-105] 100 (07/22 1800) Resp:  [13-27] 16 (07/22 1800) BP: (113-154)/(55-90) 139/68 (07/22 1800) SpO2:  [90 %-100 %] 97 % (07/22 1800) Weight:  [61.3 kg] 61.3 kg (07/22 0454)  Intake/Output from previous day: 07/21 0701 - 07/22 0700 In: 2406.5 [P.O.:21; I.V.:1635.5; IV Piggyback:750] Out: 250 [Urine:250] Intake/Output this shift: Total I/O In: 884.9 [P.O.:600; I.V.:184.9; IV Piggyback:100] Out: -   Awake alert oriented strength 5 out of 5  Lab Results: Recent Labs    05/01/19 0440 05/02/19 0420  WBC 8.2 6.9  HGB 9.3* 8.5*  HCT 29.2* 26.4*  PLT 83* 61*   BMET Recent Labs    05/01/19 0440 05/02/19 0420  NA 139 140  K 3.6 2.9*  CL 113* 116*  CO2 18* 16*  GLUCOSE 233* 157*  BUN 20 11  CREATININE 0.87 0.92  CALCIUM 8.0* 8.0*    Studies/Results: No results found.  Assessment/Plan: Post bleed day 4 hospital day 3 doing much better no evidence of seizure activity continue observation in the ICU plan angios later in the week  LOS: 3 days     Minahil Quinlivan P 05/03/2019, 6:40 PM

## 2019-05-03 NOTE — Progress Notes (Signed)
Occupational Therapy Treatment Patient Details Name: Shannon Herrera MRN: [id number] DOB: [birth year] Today's Date: 05/03/2019    History of present illness 63 yo female admitted due to seizure activity with ETOH abuse. Pt intubated at [facility] and transfered to [facility] with extubation 05/01/19.  Pt has L temporal and parietal scalp hematoma  and BIL SAH. PMH: hx of seizure, encephalopathy 01/2019, HTN, NIDDM, Hypothyroidism, multiple falls   OT comments  Pt progressing towards OT goals, performing ADL and room/hallway mobility without AD and close minguard assist throughout. Pt with increased RR (up to mid 40s) with activity though quickly decreases with standing rest break. Pt is quick to move and requires intermittent cues for safety. BP post activity 145/95. Will continue per POC at this time.    Follow Up Recommendations  Outpatient OT    Equipment Recommendations  None recommended by OT          Precautions / Restrictions Precautions Precautions: Fall Precaution Comments: seizures; SBP <160 Restrictions Weight Bearing Restrictions: No       Mobility Bed Mobility Overal bed mobility: Modified Independent                Transfers Overall transfer level: Needs assistance Equipment used: None Transfers: Sit to/from Stand Sit to Stand: Supervision              Balance Overall balance assessment: Needs assistance   Sitting balance-Leahy Scale: Good       Standing balance-Leahy Scale: Good                             ADL either performed or assessed with clinical judgement   ADL Overall ADL's : Needs assistance/impaired     Grooming: Wash/dry hands;Standing;Supervision/safety                   Toilet Transfer: Supervision/safety;Ambulation;Regular Toilet   Toileting- Architect and Hygiene: Supervision/safety;Sitting/lateral lean;Sit to/from stand Toileting - Architect Details (indicate cue type and reason):  performing pericare via lateral leans      Functional mobility during ADLs: Min guard       Vision       Perception     Praxis      Cognition Arousal/Alertness: Awake/alert Behavior During Therapy: WFL for tasks assessed/performed Overall Cognitive Status: Within Functional Limits for tasks assessed                                 General Comments: pt required increased cues to follow commands to count backwards from 100 by 5's. unable to do so by 3's (declined attempting); overall appropriate for basic tasks assessed        Exercises     Shoulder Instructions       General Comments RR up to mid 40s with hallway mobility, decreasing with brief standing rest break    Pertinent Vitals/ Pain       Pain Assessment: No/denies pain  Home Living                                          Prior Functioning/Environment              Frequency  Min 3X/week        Progress Toward Goals  OT Goals(current goals  can now be found in the care plan section)  Progress towards OT goals: Progressing toward goals  Acute Rehab OT Goals Patient Stated Goal: get back to work OT Goal Formulation: With patient Time For Goal Achievement: 05/16/19 Potential to Achieve Goals: Good ADL Goals Pt Will Perform Upper Body Bathing: with modified independence Pt Will Perform Lower Body Bathing: with modified independence Additional ADL Goal #1: pt will complete the DGI with score greater than 19 to demonstrate decr fall risk with adls.  Plan Discharge plan remains appropriate    Co-evaluation                 AM-PAC OT "6 Clicks" Daily Activity     Outcome Measure   Help from another person eating meals?: None Help from another person taking care of personal grooming?: None Help from another person toileting, which includes using toliet, bedpan, or urinal?: None Help from another person bathing (including washing, rinsing, drying)?: A  Little Help from another person to put on and taking off regular upper body clothing?: None Help from another person to put on and taking off regular lower body clothing?: None 6 Click Score: 23    End of Session    OT Visit Diagnosis: Unsteadiness on feet (R26.81)   Activity Tolerance Patient tolerated treatment well   Patient Left in bed;with call bell/phone within reach;with bed alarm set   Nurse Communication Mobility status        Time: 1517-6160 OT Time Calculation (min): 10 min  Charges: OT General Charges $OT Visit: 1 Visit OT Treatments $Self Care/Home Management : 8-22 mins  Marcy Siren, OT Supplemental Rehabilitation Services Pager [phone] Office [phone]    Orlando Penner 05/03/2019, 4:19 PM

## 2019-05-03 NOTE — Progress Notes (Addendum)
Physical Therapy Treatment Patient Details Name: Shannon Herrera MRN: [id number] DOB: 1956-01-22 Today's Date: 05/03/2019    History of Present Illness 63 yo female admitted due to seizure activity with ETOH abuse. Pt intubated at [facility] and transfered to [facility] with extubation 05/01/19.  Pt has L temporal and parietal scalp hematoma  and BIL SAH. PMH: hx of seizure, encephalopathy 01/2019, HTN, NIDDM, Hypothyroidism, multiple falls    PT Comments    Patient is progressing very well towards their physical therapy goals. Ambulating 550 feet with no assistive device and hands on assist. Able to perform high level balance activities I.e. backwards walking and side stepping without overt loss of balance. Complaining of mild headache. D/c plan remains appropriate.     Follow Up Recommendations  Outpatient PT;Supervision - Intermittent     Equipment Recommendations  None recommended by PT    Recommendations for Other Services       Precautions / Restrictions Precautions Precautions: Fall Precaution Comments: seizures; SBP <160 Restrictions Weight Bearing Restrictions: No    Mobility  Bed Mobility               General bed mobility comments: OOB in chair  Transfers Overall transfer level: Needs assistance Equipment used: None Transfers: Sit to/from Stand Sit to Stand: Supervision            Ambulation/Gait Ambulation/Gait assistance: Min guard Gait Distance (Feet): 540 Feet Assistive device: None Gait Pattern/deviations: WFL(Within Functional Limits) Gait velocity: decreased   General Gait Details: Hands on assist for stability, but no overt LOB. Able to perform head turns, step over obstacles, perform varying gait speeds, 90 degree turns, sideways walking and backwards walking    Stairs             Wheelchair Mobility    Modified Rankin (Stroke Patients Only) Modified Rankin (Stroke Patients Only) Pre-Morbid Rankin Score: No significant  disability Modified Rankin: Moderately severe disability     Balance Overall balance assessment: Needs assistance   Sitting balance-Leahy Scale: Good       Standing balance-Leahy Scale: Good                              Cognition Arousal/Alertness: Awake/alert Behavior During Therapy: WFL for tasks assessed/performed Overall Cognitive Status: Within Functional Limits for tasks assessed                                 General Comments: Oriented to day of week, 3/3 short term recall, responding to all questions appropriately      Exercises      General Comments  BP 147/110 (115) post mobility      Pertinent Vitals/Pain Pain Assessment: Faces Faces Pain Scale: Hurts a little bit Pain Location: headache Pain Intervention(s): Monitored during session    Home Living                      Prior Function            PT Goals (current goals can now be found in the care plan section) Acute Rehab PT Goals Patient Stated Goal: get back to work Potential to Achieve Goals: Good Progress towards PT goals: Progressing toward goals    Frequency    Min 4X/week      PT Plan Current plan remains appropriate    Co-evaluation  AM-PAC PT "6 Clicks" Mobility   Outcome Measure  Help needed turning from your back to your side while in a flat bed without using bedrails?: None Help needed moving from lying on your back to sitting on the side of a flat bed without using bedrails?: None Help needed moving to and from a bed to a chair (including a wheelchair)?: None Help needed standing up from a chair using your arms (e.g., wheelchair or bedside chair)?: None Help needed to walk in hospital room?: A Little Help needed climbing 3-5 steps with a railing? : A Little 6 Click Score: 22    End of Session Equipment Utilized During Treatment: Gait belt Activity Tolerance: Patient tolerated treatment well Patient left: in  chair;with call bell/phone within reach Nurse Communication: Mobility status PT Visit Diagnosis: Unsteadiness on feet (R26.81);Difficulty in walking, not elsewhere classified (R26.2)     Time: 1191-4782 PT Time Calculation (min) (ACUTE ONLY): 19 min  Charges:  $Therapeutic Activity: 8-22 mins                     Laurina Bustle, PT, DPT Acute Rehabilitation Services Pager [phone] Office [phone]    Vanetta Mulders 05/03/2019, 10:49 AM

## 2019-05-04 ENCOUNTER — Inpatient Hospital Stay (HOSPITAL_COMMUNITY): Payer: Self-pay

## 2019-05-04 ENCOUNTER — Encounter (HOSPITAL_COMMUNITY): Payer: Self-pay | Admitting: Neurosurgery

## 2019-05-04 HISTORY — PX: IR US GUIDE VASC ACCESS RIGHT: IMG2390

## 2019-05-04 HISTORY — PX: IR ANGIO VERTEBRAL SEL VERTEBRAL UNI R MOD SED: IMG5368

## 2019-05-04 HISTORY — PX: IR ANGIO INTRA EXTRACRAN SEL COM CAROTID INNOMINATE UNI L MOD SED: IMG5358

## 2019-05-04 HISTORY — PX: IR ANGIO INTRA EXTRACRAN SEL INTERNAL CAROTID UNI R MOD SED: IMG5362

## 2019-05-04 LAB — GLUCOSE, CAPILLARY
Glucose-Capillary: 139 mg/dL — ABNORMAL HIGH (ref 70–99)
Glucose-Capillary: 144 mg/dL — ABNORMAL HIGH (ref 70–99)
Glucose-Capillary: 180 mg/dL — ABNORMAL HIGH (ref 70–99)
Glucose-Capillary: 129 mg/dL — ABNORMAL HIGH (ref 70–99)
Glucose-Capillary: 282 mg/dL — ABNORMAL HIGH (ref 70–99)
Glucose-Capillary: 173 mg/dL — ABNORMAL HIGH (ref 70–99)

## 2019-05-04 LAB — PROCALCITONIN: Procalcitonin: 0.64 ng/mL

## 2019-05-04 MED ORDER — LIDOCAINE HCL 1 % IJ SOLN
INTRAMUSCULAR | Status: AC
  Filled 2019-05-04: qty 20

## 2019-05-04 MED ORDER — HEPARIN SODIUM (PORCINE) 1000 UNIT/ML IJ SOLN
INTRAMUSCULAR | Status: AC
  Filled 2019-05-04: qty 1

## 2019-05-04 MED ORDER — FENTANYL CITRATE (PF) 100 MCG/2ML IJ SOLN
INTRAMUSCULAR | Status: AC
  Filled 2019-05-04: qty 2

## 2019-05-04 MED ORDER — IOHEXOL 300 MG/ML  SOLN
100.0000 mL | Freq: Once | INTRAMUSCULAR | Status: AC | PRN
  Administered 2019-05-04: 50 mL via INTRA_ARTERIAL

## 2019-05-04 MED ORDER — FENTANYL CITRATE (PF) 100 MCG/2ML IJ SOLN
INTRAMUSCULAR | Status: AC | PRN
  Administered 2019-05-04: 25 ug via INTRAVENOUS

## 2019-05-04 MED ORDER — VERAPAMIL HCL 2.5 MG/ML IV SOLN
INTRAVENOUS | Status: AC
  Filled 2019-05-04: qty 2

## 2019-05-04 MED ORDER — NITROGLYCERIN 1 MG/10 ML FOR IR/CATH LAB
INTRA_ARTERIAL | Status: AC
  Filled 2019-05-04: qty 10

## 2019-05-04 MED ORDER — HEPARIN SODIUM (PORCINE) 1000 UNIT/ML IJ SOLN
INTRAMUSCULAR | Status: AC | PRN
  Administered 2019-05-04: 3000 [IU] via INTRAVENOUS

## 2019-05-04 NOTE — Brief Op Note (Signed)
  NEUROSURGERY BRIEF OPERATIVE  NOTE   PREOP DX: SAH  POSTOP DX: Same  PROCEDURE: Diagnostic cerebral angiogram  SURGEON: Dr. Consuella Lose, MD  ANESTHESIA: IV Sedation with Local  EBL: Minimal  SPECIMENS: None  COMPLICATIONS: None  CONDITION: Stable to recovery  FINDINGS (Full report in [facility]): 1. No intracranial aneurysms, AVM, or high-flow fistulas 2. No vasospasm seen

## 2019-05-04 NOTE — Care Management (Addendum)
Noted pt with SAH on Nimotop therapy.  Pt uninsured, but is eligible for medication assistance through [facility] program, which will allow all dc meds to be provided for $3/each.  MD:  Should pt need Nimotop upon dc, please send all dc medications to [facility] to be filled; they should have Nimotop in stock and can provide using [place] letter.  Meds will be delivered to pt's bedside after they are filled.  Pharmacy is open only Monday-Friday 8A-5P.  No weekends.  Please notify me if you have questions.   Reinaldo Raddle, RN, BSN  Trauma/Neuro ICU Case Manager [phone]

## 2019-05-04 NOTE — Progress Notes (Signed)
  NEUROSURGERY PROGRESS NOTE   No issues overnight. History reviewed in EMR and with pt. She presented last week after a SZ during which she fell and hit her head. 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 Diagnostic cerebral angiogram was requested.  EXAM:  BP (!) 154/81   Pulse 94   Temp 97.7 F (36.5 C) (Oral)   Resp 15   Ht 5\' 3"  (1.6 m)   Wt 61 kg   SpO2 100%   BMI 23.82 kg/m   Awake, alert, oriented  Speech fluent, appropriate  CN grossly intact  5/5 BUE/BLE   IMAGING: CTH dated 04/29/19 was reviewed demonstrating right quadrigeminal cistern and left sylvian/convexity SAH. No HCP. CTA dated 04/30/19 also reviewed without evidence of intracranial aneurysm.  IMPRESSION:  63 y.o. female s/p SZ with fall and associated SAH. Pattern of SAH suggests traumatic etiology although cannot r/o underlying aneurysm as potential cause for SZ, SAH, and fall.  PLAN: - Will proceed with confirmatory diagnostic cerebral angiogram  I did review the indications for the procedure, its associated risks including stroke, hematoma, arterial dissection, and contrast nephropathy, and recovery with the patient. All her questions today were answered and informed consent was obtained.

## 2019-05-04 NOTE — Progress Notes (Signed)
Physical Therapy Treatment Patient Details Name: Shannon Herrera MRN: [id number] DOB: 05/31/1956 Today's Date: 05/04/2019    History of Present Illness 63 yo female admitted due to seizure activity with ETOH abuse. Pt intubated at [facility] and transfered to [facility] with extubation 05/01/19.  Pt has L temporal and parietal scalp hematoma  and BIL SAH. PMH: hx of seizure, encephalopathy 01/2019, HTN, NIDDM, Hypothyroidism, multiple falls    PT Comments    Pt improving well back toward baseline function and balance.   Follow Up Recommendations  Outpatient PT;Supervision - Intermittent     Equipment Recommendations  None recommended by PT    Recommendations for Other Services       Precautions / Restrictions Precautions Precautions: Fall(minimal fall risks)    Mobility  Bed Mobility Overal bed mobility: Modified Independent             General bed mobility comments: OOB in chair  Transfers Overall transfer level: Needs assistance Equipment used: None Transfers: Sit to/from Stand Sit to Stand: Supervision            Ambulation/Gait Ambulation/Gait assistance: Supervision Gait Distance (Feet): 600 Feet Assistive device: None Gait Pattern/deviations: WFL(Within Functional Limits)   Gait velocity interpretation: >2.62 ft/sec, indicative of community ambulatory General Gait Details: steady, age appropriate speeds.  pt reports feeling mildly unsteady, but only mildly noticeablye   Stairs Stairs: Yes Stairs assistance: Supervision Stair Management: One rail Right;Alternating pattern;Forwards Number of Stairs: 6 General stair comments: safe with rails   Wheelchair Mobility    Modified Rankin (Stroke Patients Only)       Balance Overall balance assessment: Needs assistance   Sitting balance-Leahy Scale: Normal       Standing balance-Leahy Scale: Good                   Standardized Balance Assessment Standardized Balance Assessment : Dynamic Gait  Index   Dynamic Gait Index Level Surface: Normal Change in Gait Speed: Normal Gait with Horizontal Head Turns: Normal Gait with Vertical Head Turns: Normal Gait and Pivot Turn: Normal Step Over Obstacle: Normal Step Around Obstacles: Mild Impairment Steps: Mild Impairment Total Score: 22      Cognition Arousal/Alertness: Awake/alert Behavior During Therapy: WFL for tasks assessed/performed Overall Cognitive Status: Within Functional Limits for tasks assessed                                        Exercises      General Comments        Pertinent Vitals/Pain Pain Assessment: No/denies pain    Home Living                      Prior Function            PT Goals (current goals can now be found in the care plan section) Acute Rehab PT Goals PT Goal Formulation: With patient Time For Goal Achievement: 05/16/19 Potential to Achieve Goals: Good Progress towards PT goals: Progressing toward goals    Frequency    Min 4X/week      PT Plan Current plan remains appropriate    Co-evaluation              AM-PAC PT "6 Clicks" Mobility   Outcome Measure  Help needed turning from your back to your side while in a flat bed without using bedrails?: None Help needed  moving from lying on your back to sitting on the side of a flat bed without using bedrails?: None Help needed moving to and from a bed to a chair (including a wheelchair)?: None Help needed standing up from a chair using your arms (e.g., wheelchair or bedside chair)?: None Help needed to walk in hospital room?: None Help needed climbing 3-5 steps with a railing? : None 6 Click Score: 24    End of Session   Activity Tolerance: Patient tolerated treatment well Patient left: in chair;with call bell/phone within reach Nurse Communication: Mobility status PT Visit Diagnosis: Unsteadiness on feet (R26.81);Difficulty in walking, not elsewhere classified (R26.2)     Time:  1610-9604 PT Time Calculation (min) (ACUTE ONLY): 12 min  Charges:  $Gait Training: 8-22 mins                     05/04/2019  R[place] Dell Bing, PT Acute Rehabilitation Services [phone]  (pager) [phone]  (office)   Eliseo Gum Aydn Ferrara 05/04/2019, 5:40 PM

## 2019-05-04 NOTE — Progress Notes (Signed)
Subjective: Patient reports some mild headache at times but otherwise "feels great"  Objective: Vital signs in last 24 hours: Temp:  [97.7 F (36.5 C)-98.9 F (37.2 C)] 97.7 F (36.5 C) (07/23 0800) Pulse Rate:  [59-111] 94 (07/23 0800) Resp:  [14-26] 24 (07/23 0800) BP: (127-157)/(64-89) 153/81 (07/23 0800) SpO2:  [90 %-100 %] 100 % (07/23 0800) Weight:  [61 kg] 61 kg (07/23 0451)  Intake/Output from previous day: 07/22 0701 - 07/23 0700 In: 1024.9 [P.O.:600; I.V.:185.6; IV Piggyback:239.3] Out: -  Intake/Output this shift: No intake/output data recorded.  Neurologic: Grossly normal  Lab Results: Lab Results  Component Value Date   WBC 6.9 05/02/2019   HGB 8.5 (L) 05/02/2019   HCT 26.4 (L) 05/02/2019   MCV 100.8 (H) 05/02/2019   PLT 61 (L) 05/02/2019   Lab Results  Component Value Date   INR 1.2 04/29/2019   BMET Lab Results  Component Value Date   NA 140 05/02/2019   K 2.9 (L) 05/02/2019   CL 116 (H) 05/02/2019   CO2 16 (L) 05/02/2019   GLUCOSE 157 (H) 05/02/2019   BUN 11 05/02/2019   CREATININE 0.92 05/02/2019   CALCIUM 8.0 (L) 05/02/2019    Studies/Results: No results found.  Assessment/Plan: Doing well, Dr. Kathyrn Sheriff planning for cta today.    LOS: 4 days    Ocie Cornfield [facility] 05/04/2019, 8:45 AM

## 2019-05-04 NOTE — Progress Notes (Signed)
Speech Language Pathology Treatment: Dysphagia  Patient Details Name: Shannon Herrera MRN: [id number] DOB: 1956/08/29 Today's Date: 05/04/2019 Time: 7829-5621 SLP Time Calculation (min) (ACUTE ONLY): 15 min  Assessment / Plan / Recommendation Clinical Impression  Patient seen to address dysphagia goals. Per RN and patient, she has been tolerating Dysphagia 3, thin liquids without difficulty. Patient's voice is clear and strong without any hoarseness. She tolerated regular solids with thin liquids via straw sips without any overt s/s of aspiration or penetration. She is alert, oriented and capable of feeding herself without concern of impulsivity with PO intake. At this time, SLP is recommending upgrade to regular solids, continue with thin liquids and will s/o SLP services at this time.    HPI HPI: Patient is a 63 y.o. female with PMH : DM, HTN, hypothyroid, who presented to the ED after being found unresponsive by her family. Seizure-like activity was noted by family at home, she has a h/o chronic ETOH abuse. She was intubated and sedated in ED at [facility] and transferred to [facility] for further care. She was extubated on 7/20.      SLP Plan  Discharge SLP treatment due to (comment)(goals met, tolerating regular solids, thin liquids without need for supervision)       Recommendations  Diet recommendations: Regular;Thin liquid Liquids provided via: Cup;Straw Medication Administration: Whole meds with liquid Supervision: Patient able to self feed Compensations: Slow rate;Small sips/bites Postural Changes and/or Swallow Maneuvers: Seated upright 90 degrees                Oral Care Recommendations: Oral care BID Follow up Recommendations: None SLP Visit Diagnosis: Dysphagia, unspecified (R13.10) Plan: Discharge SLP treatment due to (comment)(goals met, tolerating regular solids, thin liquids without need for supervision)       GO                Pablo Lawrence 05/04/2019, 9:55 AM  Angela Nevin, MA, CCC-SLP Speech Therapy [facility] Acute Rehab

## 2019-05-05 ENCOUNTER — Encounter (HOSPITAL_COMMUNITY): Payer: Self-pay

## 2019-05-05 LAB — CULTURE, BLOOD (ROUTINE X 2)
Culture: NO GROWTH
Culture: NO GROWTH
Culture: NO GROWTH
Culture: NO GROWTH
Special Requests: ADEQUATE
Special Requests: ADEQUATE
Special Requests: ADEQUATE

## 2019-05-05 LAB — GLUCOSE, CAPILLARY
Glucose-Capillary: 121 mg/dL — ABNORMAL HIGH (ref 70–99)
Glucose-Capillary: 123 mg/dL — ABNORMAL HIGH (ref 70–99)
Glucose-Capillary: 136 mg/dL — ABNORMAL HIGH (ref 70–99)
Glucose-Capillary: 186 mg/dL — ABNORMAL HIGH (ref 70–99)
Glucose-Capillary: 203 mg/dL — ABNORMAL HIGH (ref 70–99)
Glucose-Capillary: 295 mg/dL — ABNORMAL HIGH (ref 70–99)

## 2019-05-05 NOTE — Progress Notes (Signed)
Physical Therapy Treatment Patient Details Name: Shannon Herrera MRN: [id number] DOB: 1956-05-08 Today's Date: 05/05/2019    History of Present Illness 63 yo female admitted due to seizure activity with ETOH abuse. Pt intubated at [facility] and transfered to [facility] with extubation 05/01/19.  Pt has L temporal and parietal scalp hematoma  and BIL SAH. PMH: hx of seizure, encephalopathy 01/2019, HTN, NIDDM, Hypothyroidism, multiple falls    PT Comments    Patient progressing well towards PT goals. Reports no pain today and able to tolerate gait training with higher level balance challenges without LOB or difficulty. Able to perform cognitive tasks during ambulation as well - serial counting and naming months backwards with only mild difficulty. Performed stairs with supervision for safety. Eager to return home. HR ranged from 100-129 bpm. Will continue to follow for higher level balance.   Follow Up Recommendations  Outpatient PT;Supervision - Intermittent     Equipment Recommendations  None recommended by PT    Recommendations for Other Services       Precautions / Restrictions Precautions Precautions: None Precaution Comments: seizures; SBP <160 Restrictions Weight Bearing Restrictions: No    Mobility  Bed Mobility Overal bed mobility: Modified Independent                Transfers Overall transfer level: Needs assistance Equipment used: None Transfers: Sit to/from Stand Sit to Stand: Modified independent (Device/Increase time)         General transfer comment: Stood from EOB without difficulty, no dizziness.  Ambulation/Gait Ambulation/Gait assistance: Supervision;Modified independent (Device/Increase time) Gait Distance (Feet): 600 Feet Assistive device: None Gait Pattern/deviations: WFL(Within Functional Limits) Gait velocity: good speed Gait velocity interpretation: >2.62 ft/sec, indicative of community ambulatory General Gait Details: steady, age appropriate speed.  Able to dual tasking with cognitive tasks without difficulty. No LOB. HR ranged from 100-129 bpm.   Stairs Stairs: Yes Stairs assistance: Supervision Stair Management: One rail Left;Alternating pattern;Forwards Number of Stairs: 12 General stair comments: Cues for safety and to slow down.   Wheelchair Mobility    Modified Rankin (Stroke Patients Only) Modified Rankin (Stroke Patients Only) Pre-Morbid Rankin Score: No significant disability Modified Rankin: Slight disability     Balance Overall balance assessment: Needs assistance Sitting-balance support: Feet supported;No upper extremity supported Sitting balance-Leahy Scale: Normal     Standing balance support: During functional activity Standing balance-Leahy Scale: Good                 High Level Balance Comments: Able to step over objects and perform head turns.changes in direction without deviations in gait.            Cognition Arousal/Alertness: Awake/alert Behavior During Therapy: WFL for tasks assessed/performed Overall Cognitive Status: Within Functional Limits for tasks assessed                                 General Comments: Difficulty counting back from 100s by 7s, but no evidenc eo imbalance. Able to state months backwards without difficulty.      Exercises      General Comments        Pertinent Vitals/Pain Pain Assessment: No/denies pain    Home Living                      Prior Function            PT Goals (current goals can now be found in  the care plan section) Progress towards PT goals: Progressing toward goals    Frequency    Min 4X/week      PT Plan Current plan remains appropriate    Co-evaluation              AM-PAC PT "6 Clicks" Mobility   Outcome Measure  Help needed turning from your back to your side while in a flat bed without using bedrails?: None Help needed moving from lying on your back to sitting on the side of a flat  bed without using bedrails?: None Help needed moving to and from a bed to a chair (including a wheelchair)?: None Help needed standing up from a chair using your arms (e.g., wheelchair or bedside chair)?: None Help needed to walk in hospital room?: None Help needed climbing 3-5 steps with a railing? : A Little 6 Click Score: 23    End of Session Equipment Utilized During Treatment: Gait belt Activity Tolerance: Patient tolerated treatment well Patient left: in bed;with call bell/phone within reach Nurse Communication: Mobility status PT Visit Diagnosis: Unsteadiness on feet (R26.81);Difficulty in walking, not elsewhere classified (R26.2)     Time: 1610-9604 PT Time Calculation (min) (ACUTE ONLY): 16 min  Charges:  $Gait Training: 8-22 mins                     Mylo Red, PT, DPT Acute Rehabilitation Services Pager [phone] Office [phone]       Shannon Herrera 05/05/2019, 10:46 AM

## 2019-05-05 NOTE — Progress Notes (Signed)
Subjective: Patient reports Patient doing well no headache no nausea  Objective: Vital signs in last 24 hours: Temp:  [97.4 F (36.3 C)-98.7 F (37.1 C)] 97.4 F (36.3 C) (07/24 1600) Pulse Rate:  [87-107] 93 (07/24 1600) Resp:  [10-25] 17 (07/24 1600) BP: (98-171)/(60-107) 136/71 (07/24 1600) SpO2:  [88 %-100 %] 100 % (07/24 1200) Weight:  [61 kg] 61 kg (07/24 0500)  Intake/Output from previous day: 07/23 0701 - 07/24 0700 In: 718.9 [P.O.:480; I.V.:28.2; IV Piggyback:210.7] Out: -  Intake/Output this shift: Total I/O In: 5.8 [I.V.:5.8] Out: -   Awake alert oriented no pronator drift moves all extremities well with equal strength  Lab Results: No results for input(s): WBC, HGB, HCT, PLT in the last 72 hours. BMET No results for input(s): NA, K, CL, CO2, GLUCOSE, BUN, CREATININE, CALCIUM in the last 72 hours.  Studies/Results: Ir US Guide Vasc Access Right  Result Date: 05/05/2019 PROCEDURE: DIAGNOSTIC CEREBRAL ANGIOGRAM  HISTORY: The patient is a 63 year old woman initially presenting to the hospital after a seizure and fall. Workup included CT scan which did demonstrate somewhat asymmetric right posterior fossa subarachnoid hemorrhage and left-sided sylvian and convexity subarachnoid hemorrhage. Diagnostic cerebral angiogram was therefore requested. Her initial CT angiogram was negative.  ACCESS: The technical aspects of the procedure as well as its potential risks and benefits were reviewed with the patient. These risks included but were not limited bleeding, infection, allergic reaction, damage to organs or vital structures, stroke, non-diagnostic procedure, and the catastrophic outcomes of heart attack, coma, and death. With an understanding of these risks, informed consent was obtained and witnessed. The patient was placed in the supine position on the angiography table and the skin of right groin prepped in the usual sterile fashion.  The procedure was performed under local  anesthesia (1%-solution of bicarbonate-buffered Lidocaine) and conscious sedation with 53micrograms fentanyl monitored by myself and the in-suite nurse using continuous pulse-oximetry, heart rate, and non-invasive blood-pressure.  A 5- French sheath was introduced in the right radial artery using ultrasound guidance and Seldinger technique. A fluoro-phase sequence was used to document the sheath position.  MEDICATIONS: Heparin: 3000 Units total.  Verapamil: 2.5mg   Nitroglycerin: 119micrograms  CONTRAST:  47mL OMNIPAQUE IOHEXOL 300 MG/ML  SOLNcc, Omnipaque 300  FLUOROSCOPY TIME:  FLUOROSCOPY TIME: See IR records  TECHNIQUE: CATHETERS AND WIRES 5-French Simmons-2 glide catheter  0.035" glidewire  VESSELS CATHETERIZED Right internal carotid  Left common carotid  Right vertebral  Right radial  VESSELS STUDIED Right internal carotid, head  Left common carotid, head  Right vertebral  Right radial  PROCEDURAL NARRATIVE A 5-Fr Simmons catheter was advanced over a 0.035 glidewire into the aortic arch. The above vessels were then sequentially catheterized and cervical / cerebral angiograms taken. After review of images, the catheter was removed without incident.  FINDINGS: Right internal carotid: head:  Injection reveals the presence of a widely patent ICA, M1, and A1 segments and their branches. There is a fetal type right posterior cerebral artery. No intracranial aneurysms, AVMs, or fistulas are seen. There is no vasospasm of the right carotid circulation. The parenchymal and venous phases are normal. The venous sinuses are widely patent.  Left common carotid: Head:  Injection reveals the presence of a widely patent ICA, A1, and M1 segments and their branches. No aneurysms, AVMs, or high-flow fistulas are seen. There is no vasospasm of the left carotid circulation. The parenchymal and venous phases are normal. The venous sinuses are widely patent.  Right vertebral:  Injection  reveals the presence of  a widely patent vertebral artery. This leads to a widely patent basilar artery that terminates in left P1, while the right P1 is hypoplastic. The basilar apex is normal. No aneurysms, AVM, or fistula is seen. There is no intracranial stenosis or vasospasm. The parenchymal and venous phases are normal. The venous sinuses are widely patent.  Right radial:  Normal vessel. No significant atherosclerotic disease. Arterial sheath in adequate position.  DISPOSITION: Upon completion of the study, the radial sheath was removed and patent hemostasis was obtained with application of the Terumo TR band. The procedure was well tolerated and no early complications were observed. The patient was transferred to the holding area for further care and removal of the TR band.  IMPRESSION: 1. Normal cerebral angiogram, without angiographic evidence of intracranial aneurysm, arteriovenous malformation, or high-flow fistula. There is no vasospasm.  The preliminary results of this procedure were shared with the patient and the patient's family.   Electronically Signed   By: Lisbeth Renshaw   On: 05/04/2019 15:04  Ir Angio Intra Extracran Sel Com Carotid Innominate Uni L Mod Sed  Result Date: 05/04/2019 PROCEDURE: DIAGNOSTIC CEREBRAL ANGIOGRAM HISTORY: The patient is a 63 year old woman initially presenting to the hospital after a seizure and fall. Workup included CT scan which did demonstrate somewhat asymmetric right posterior fossa subarachnoid hemorrhage and left-sided sylvian and convexity subarachnoid hemorrhage. Diagnostic cerebral angiogram was therefore requested. Her initial CT angiogram was negative. ACCESS: The technical aspects of the procedure as well as its potential risks and benefits were reviewed with the patient. These risks included but were not limited bleeding, infection, allergic reaction, damage to organs or vital structures, stroke, non-diagnostic procedure, and the catastrophic outcomes of heart  attack, coma, and death. With an understanding of these risks, informed consent was obtained and witnessed. The patient was placed in the supine position on the angiography table and the skin of right groin prepped in the usual sterile fashion. The procedure was performed under local anesthesia (1%-solution of bicarbonate-buffered Lidocaine) and conscious sedation with fentanyl monitored by myself and the in-suite nurse using continuous pulse-oximetry, heart rate, and non-invasive blood-pressure. A 5- French sheath was introduced in the right radial artery using ultrasound guidance and Seldinger technique. A fluoro-phase sequence was used to document the sheath position. MEDICATIONS: Heparin: 3000 Units total. Verapamil: 2.5mg  Nitroglycerin: CONTRAST:  50mL OMNIPAQUE IOHEXOL 300 MG/ML  SOLNcc, Omnipaque 300 FLUOROSCOPY TIME:  FLUOROSCOPY TIME: See IR records TECHNIQUE: CATHETERS AND WIRES 5-French Simmons-2 glide catheter 0.035" glidewire VESSELS CATHETERIZED Right internal carotid Left common carotid Right vertebral Right radial VESSELS STUDIED Right internal carotid, head Left common carotid, head Right vertebral Right radial PROCEDURAL NARRATIVE A 5-Fr Simmons catheter was advanced over a 0.035 glidewire into the aortic arch. The above vessels were then sequentially catheterized and cervical / cerebral angiograms taken. After review of images, the catheter was removed without incident. FINDINGS: Right internal carotid: head: Injection reveals the presence of a widely patent ICA, M1, and A1 segments and their branches. There is a fetal type right posterior cerebral artery. No intracranial aneurysms, AVMs, or fistulas are seen. There is no vasospasm of the right carotid circulation. The parenchymal and venous phases are normal. The venous sinuses are widely patent. Left common carotid: Head: Injection reveals the presence of a widely patent ICA, A1, and M1 segments and their branches. No  aneurysms, AVMs, or high-flow fistulas are seen. There is no vasospasm of the left carotid circulation. The parenchymal  and venous phases are normal. The venous sinuses are widely patent. Right vertebral: Injection reveals the presence of a widely patent vertebral artery. This leads to a widely patent basilar artery that terminates in left P1, while the right P1 is hypoplastic. The basilar apex is normal. No aneurysms, AVM, or fistula is seen. There is no intracranial stenosis or vasospasm. The parenchymal and venous phases are normal. The venous sinuses are widely patent. Right radial: Normal vessel. No significant atherosclerotic disease. Arterial sheath in adequate position. DISPOSITION: Upon completion of the study, the radial sheath was removed and patent hemostasis was obtained with application of the Terumo TR band. The procedure was well tolerated and no early complications were observed. The patient was transferred to the holding area for further care and removal of the TR band. IMPRESSION: 1. Normal cerebral angiogram, without angiographic evidence of intracranial aneurysm, arteriovenous malformation, or high-flow fistula. There is no vasospasm. The preliminary results of this procedure were shared with the patient and the patient's family. Electronically Signed   By: Lisbeth RenshawNeelesh  Nundkumar   On: 05/04/2019 15:04   Ir Angio Intra Extracran Sel Internal Carotid Uni R Mod Sed  Result Date: 05/05/2019 PROCEDURE: DIAGNOSTIC CEREBRAL ANGIOGRAM  HISTORY: The patient is a 63 year old woman initially presenting to the hospital after a seizure and fall. Workup included CT scan which did demonstrate somewhat asymmetric right posterior fossa subarachnoid hemorrhage and left-sided sylvian and convexity subarachnoid hemorrhage. Diagnostic cerebral angiogram was therefore requested. Her initial CT angiogram was negative.  ACCESS: The technical aspects of the procedure as well as its potential risks and benefits were  reviewed with the patient. These risks included but were not limited bleeding, infection, allergic reaction, damage to organs or vital structures, stroke, non-diagnostic procedure, and the catastrophic outcomes of heart attack, coma, and death. With an understanding of these risks, informed consent was obtained and witnessed. The patient was placed in the supine position on the angiography table and the skin of right groin prepped in the usual sterile fashion.  The procedure was performed under local anesthesia (1%-solution of bicarbonate-buffered Lidocaine) and conscious sedation with 25micrograms fentanyl monitored by myself and the in-suite nurse using continuous pulse-oximetry, heart rate, and non-invasive blood-pressure.  A 5- French sheath was introduced in the right radial artery using ultrasound guidance and Seldinger technique. A fluoro-phase sequence was used to document the sheath position.  MEDICATIONS: Heparin: 3000 Units total.  Verapamil: 2.5mg   Nitroglycerin: 100micrograms  CONTRAST:  50mL OMNIPAQUE IOHEXOL 300 MG/ML  SOLNcc, Omnipaque 300  FLUOROSCOPY TIME:  FLUOROSCOPY TIME: See IR records  TECHNIQUE: CATHETERS AND WIRES 5-French Simmons-2 glide catheter  0.035" glidewire  VESSELS CATHETERIZED Right internal carotid  Left common carotid  Right vertebral  Right radial  VESSELS STUDIED Right internal carotid, head  Left common carotid, head  Right vertebral  Right radial  PROCEDURAL NARRATIVE A 5-Fr Simmons catheter was advanced over a 0.035 glidewire into the aortic arch. The above vessels were then sequentially catheterized and cervical / cerebral angiograms taken. After review of images, the catheter was removed without incident.  FINDINGS: Right internal carotid: head:  Injection reveals the presence of a widely patent ICA, M1, and A1 segments and their branches. There is a fetal type right posterior cerebral artery. No intracranial aneurysms, AVMs, or fistulas are seen. There  is no vasospasm of the right carotid circulation. The parenchymal and venous phases are normal. The venous sinuses are widely patent.  Left common carotid: Head:  Injection reveals the presence of  a widely patent ICA, A1, and M1 segments and their branches. No aneurysms, AVMs, or high-flow fistulas are seen. There is no vasospasm of the left carotid circulation. The parenchymal and venous phases are normal. The venous sinuses are widely patent.  Right vertebral:  Injection reveals the presence of a widely patent vertebral artery. This leads to a widely patent basilar artery that terminates in left P1, while the right P1 is hypoplastic. The basilar apex is normal. No aneurysms, AVM, or fistula is seen. There is no intracranial stenosis or vasospasm. The parenchymal and venous phases are normal. The venous sinuses are widely patent.  Right radial:  Normal vessel. No significant atherosclerotic disease. Arterial sheath in adequate position.  DISPOSITION: Upon completion of the study, the radial sheath was removed and patent hemostasis was obtained with application of the Terumo TR band. The procedure was well tolerated and no early complications were observed. The patient was transferred to the holding area for further care and removal of the TR band.  IMPRESSION: 1. Normal cerebral angiogram, without angiographic evidence of intracranial aneurysm, arteriovenous malformation, or high-flow fistula. There is no vasospasm.  The preliminary results of this procedure were shared with the patient and the patient's family.   Electronically Signed   By: Lisbeth RenshawNeelesh  Nundkumar   On: 05/04/2019 15:04  Ir Angio Vertebral Sel Vertebral Uni R Mod Sed  Result Date: 05/04/2019 PROCEDURE: DIAGNOSTIC CEREBRAL ANGIOGRAM HISTORY: The patient is a 63 year old woman initially presenting to the hospital after a seizure and fall. Workup included CT scan which did demonstrate somewhat asymmetric right posterior fossa subarachnoid  hemorrhage and left-sided sylvian and convexity subarachnoid hemorrhage. Diagnostic cerebral angiogram was therefore requested. Her initial CT angiogram was negative. ACCESS: The technical aspects of the procedure as well as its potential risks and benefits were reviewed with the patient. These risks included but were not limited bleeding, infection, allergic reaction, damage to organs or vital structures, stroke, non-diagnostic procedure, and the catastrophic outcomes of heart attack, coma, and death. With an understanding of these risks, informed consent was obtained and witnessed. The patient was placed in the supine position on the angiography table and the skin of right groin prepped in the usual sterile fashion. The procedure was performed under local anesthesia (1%-solution of bicarbonate-buffered Lidocaine) and conscious sedation with 25micrograms fentanyl monitored by myself and the in-suite nurse using continuous pulse-oximetry, heart rate, and non-invasive blood-pressure. A 5- French sheath was introduced in the right radial artery using ultrasound guidance and Seldinger technique. A fluoro-phase sequence was used to document the sheath position. MEDICATIONS: Heparin: 3000 Units total. Verapamil: 2.5mg  Nitroglycerin: 100micrograms CONTRAST:  50mL OMNIPAQUE IOHEXOL 300 MG/ML  SOLNcc, Omnipaque 300 FLUOROSCOPY TIME:  FLUOROSCOPY TIME: See IR records TECHNIQUE: CATHETERS AND WIRES 5-French Simmons-2 glide catheter 0.035" glidewire VESSELS CATHETERIZED Right internal carotid Left common carotid Right vertebral Right radial VESSELS STUDIED Right internal carotid, head Left common carotid, head Right vertebral Right radial PROCEDURAL NARRATIVE A 5-Fr Simmons catheter was advanced over a 0.035 glidewire into the aortic arch. The above vessels were then sequentially catheterized and cervical / cerebral angiograms taken. After review of images, the catheter was removed without incident. FINDINGS: Right internal  carotid: head: Injection reveals the presence of a widely patent ICA, M1, and A1 segments and their branches. There is a fetal type right posterior cerebral artery. No intracranial aneurysms, AVMs, or fistulas are seen. There is no vasospasm of the right carotid circulation. The parenchymal and venous phases are normal. The venous sinuses  are widely patent. Left common carotid: Head: Injection reveals the presence of a widely patent ICA, A1, and M1 segments and their branches. No aneurysms, AVMs, or high-flow fistulas are seen. There is no vasospasm of the left carotid circulation. The parenchymal and venous phases are normal. The venous sinuses are widely patent. Right vertebral: Injection reveals the presence of a widely patent vertebral artery. This leads to a widely patent basilar artery that terminates in left P1, while the right P1 is hypoplastic. The basilar apex is normal. No aneurysms, AVM, or fistula is seen. There is no intracranial stenosis or vasospasm. The parenchymal and venous phases are normal. The venous sinuses are widely patent. Right radial: Normal vessel. No significant atherosclerotic disease. Arterial sheath in adequate position. DISPOSITION: Upon completion of the study, the radial sheath was removed and patent hemostasis was obtained with application of the Terumo TR band. The procedure was well tolerated and no early complications were observed. The patient was transferred to the holding area for further care and removal of the TR band. IMPRESSION: 1. Normal cerebral angiogram, without angiographic evidence of intracranial aneurysm, arteriovenous malformation, or high-flow fistula. There is no vasospasm. The preliminary results of this procedure were shared with the patient and the patient's family. Electronically Signed   By: Lisbeth RenshawNeelesh  Nundkumar   On: 05/04/2019 15:04    Assessment/Plan: Post bleed day 6 hospital day 5 angios negative perimesencephalic subarachnoid hemorrhage doing  well will transfer to the stepdown area and well mobilize with physical Occupational Therapy possible discharge home in a couple days  LOS: 5 days     Lael Wetherbee P 05/05/2019, 4:38 PM

## 2019-05-06 LAB — GLUCOSE, CAPILLARY
Glucose-Capillary: 143 mg/dL — ABNORMAL HIGH (ref 70–99)
Glucose-Capillary: 137 mg/dL — ABNORMAL HIGH (ref 70–99)
Glucose-Capillary: 255 mg/dL — ABNORMAL HIGH (ref 70–99)

## 2019-05-06 MED ORDER — TRAMADOL HCL 50 MG PO TABS: 50.0000 mg | ORAL_TABLET | Freq: Four times a day (QID) | ORAL | 0 refills | Status: DC | PRN

## 2019-05-06 NOTE — Discharge Summary (Signed)
Physician Discharge Summary  Patient ID: Shannon Herrera MRN: [id number] DOB/AGE: [birth year] 63 y.o.  Admit date: 04/29/2019 Discharge date: 05/06/2019  Admission Diagnoses:  Discharge Diagnoses:  Active Problems:   SAH (subarachnoid hemorrhage) ([facility])   Discharged Condition: good  Hospital Course: Patient admitted for evaluation of perimesencephalic spontaneous subarachnoid hemorrhage.  Work-up including NGO negative for vascular malformation or aneurysm.  Patient with mild headache.  Neurologic exam intact.  Mobilizing without issues.  Ready for discharge home.  Consults:   Significant Diagnostic Studies:   Treatments:   Discharge Exam: Blood pressure 132/70, pulse 87, temperature 98.7 F (37.1 C), temperature source Oral, resp. rate 17, height _0  (1.6 m), weight 61 kg, SpO2 100 %. Awake and alert.  Oriented and appropriate.  Cranial nerve function intact.  Speech fluent.  Judgment insight intact.  Motor 5/5 bilaterally.  No pronator drift.  Chest and abdomen benign.  Disposition: Discharge disposition: 01-Home or Self Care        Allergies as of 05/06/2019   No Known Allergies     Medication List    TAKE these medications   aspirin EC 325 MG tablet Take 325 mg by mouth daily.   folic acid 1 MG tablet Commonly known as: FOLVITE Take 1 tablet (1 mg total) by mouth daily.   glucose monitoring kit monitoring kit 1 each by Does not apply route 4 (four) times daily - after meals and at bedtime. 1 month Diabetic Testing Supplies for QAC-QHS accuchecks.   ibuprofen 200 MG tablet Commonly known as: ADVIL Take 200-400 mg by mouth every 6 (six) hours as needed for headache or mild pain.   Insulin Pen Needle 31G X 8 MM Misc Commonly known as: TechLite Pen Needles 1 each by Does not apply route 2 (two) times a day.   levETIRAcetam 750 MG tablet Commonly known as: Keppra Take 1 tablet (750 mg total) by mouth 2 (two) times daily.   levothyroxine 100 MCG  tablet Commonly known as: SYNTHROID Take 1 tablet (100 mcg total) by mouth daily before breakfast.   lisinopril-hydrochlorothiazide 20-25 MG tablet Commonly known as: ZESTORETIC Take 1 tablet by mouth daily.   lovastatin 20 MG tablet Commonly known as: MEVACOR Take 40 mg by mouth at bedtime.   metFORMIN 1000 MG tablet Commonly known as: GLUCOPHAGE Take 1,000 mg by mouth 2 (two) times daily with a meal.   metoprolol tartrate 50 MG tablet Commonly known as: LOPRESSOR Take 1 tablet (50 mg total) by mouth 2 (two) times daily.   NovoLIN 70/30 (70-30) 100 UNIT/ML injection Generic drug: insulin NPH-regular Human Inject 100 Units into the skin 2 (two) times daily with a meal.   thiamine 100 MG tablet Commonly known as: VITAMIN B-1 Take 1 tablet (100 mg total) by mouth daily.   traMADol 50 MG tablet Commonly known as: Ultram Take 1 tablet (50 mg total) by mouth every 6 (six) hours as needed for moderate pain.        Signed: Cooper Render Jaylea Plourde 05/06/2019, 11:10 AM

## 2019-05-06 NOTE — Care Management (Signed)
Referrals sent for outpatient PT/OT to Outpatient therapy at [facility].

## 2019-05-06 NOTE — Discharge Instructions (Signed)
No lifting greater than 5 pounds, light activity level at home.  No driving.  Follow-up with Dr. Saintclair Halsted in 2 weeks.  Call [phone] for appointment.

## 2019-05-06 NOTE — Progress Notes (Signed)
Pt with d/c orders. IV removed. Discharge paperwork reviewed with pt and all questions answered. Verified with pharmacy that no medications were stored in pharmacy. No new prescriptions. Pt escorted out to vehicle via wheelchair with belongings.

## 2019-05-11 ENCOUNTER — Telehealth (HOSPITAL_COMMUNITY): Payer: Self-pay

## 2019-05-11 NOTE — Telephone Encounter (Signed)
L/m reqested return phone call for more Insurance information before her eval on 05/16/2019.  NF 05/11/2019

## 2019-05-16 ENCOUNTER — Telehealth (HOSPITAL_COMMUNITY): Payer: Self-pay

## 2019-05-16 ENCOUNTER — Ambulatory Visit (HOSPITAL_COMMUNITY): Payer: Medicaid Other

## 2019-05-16 NOTE — Telephone Encounter (Signed)
pt's insurance does not cover this type of therapy pt only has family planning medicaid

## 2019-05-19 ENCOUNTER — Ambulatory Visit (HOSPITAL_COMMUNITY): Payer: Medicaid Other | Admitting: Physical Therapy

## 2020-04-22 ENCOUNTER — Emergency Department (HOSPITAL_COMMUNITY): Payer: Medicare Other

## 2020-04-22 ENCOUNTER — Inpatient Hospital Stay (HOSPITAL_COMMUNITY)
Admission: EM | Admit: 2020-04-22 | Discharge: 2020-04-26 | DRG: 637 | Disposition: A | Discharge status: Home / Self Care | Payer: Medicare Other | Attending: Internal Medicine | Admitting: Internal Medicine

## 2020-04-22 ENCOUNTER — Encounter (HOSPITAL_COMMUNITY): Payer: Self-pay | Admitting: Emergency Medicine

## 2020-04-22 ENCOUNTER — Other Ambulatory Visit: Payer: Self-pay

## 2020-04-22 DIAGNOSIS — I10 Essential (primary) hypertension: Secondary | ICD-10-CM | POA: Diagnosis present

## 2020-04-22 DIAGNOSIS — Z79890 Hormone replacement therapy (postmenopausal): Secondary | ICD-10-CM

## 2020-04-22 DIAGNOSIS — Z7982 Long term (current) use of aspirin: Secondary | ICD-10-CM

## 2020-04-22 DIAGNOSIS — E1100 Type 2 diabetes mellitus with hyperosmolarity without nonketotic hyperglycemic-hyperosmolar coma (NKHHC): Principal | ICD-10-CM | POA: Diagnosis present

## 2020-04-22 DIAGNOSIS — G40909 Epilepsy, unspecified, not intractable, without status epilepticus: Secondary | ICD-10-CM | POA: Diagnosis present

## 2020-04-22 DIAGNOSIS — Z20822 Contact with and (suspected) exposure to covid-19: Secondary | ICD-10-CM | POA: Diagnosis present

## 2020-04-22 DIAGNOSIS — E729 Disorder of amino-acid metabolism, unspecified: Secondary | ICD-10-CM | POA: Diagnosis present

## 2020-04-22 DIAGNOSIS — G9341 Metabolic encephalopathy: Secondary | ICD-10-CM | POA: Diagnosis present

## 2020-04-22 DIAGNOSIS — D696 Thrombocytopenia, unspecified: Secondary | ICD-10-CM | POA: Diagnosis present

## 2020-04-22 DIAGNOSIS — R739 Hyperglycemia, unspecified: Secondary | ICD-10-CM

## 2020-04-22 DIAGNOSIS — F0390 Unspecified dementia without behavioral disturbance: Secondary | ICD-10-CM | POA: Diagnosis present

## 2020-04-22 DIAGNOSIS — Z794 Long term (current) use of insulin: Secondary | ICD-10-CM

## 2020-04-22 DIAGNOSIS — B9620 Unspecified Escherichia coli [E. coli] as the cause of diseases classified elsewhere: Secondary | ICD-10-CM | POA: Diagnosis present

## 2020-04-22 DIAGNOSIS — Z79899 Other long term (current) drug therapy: Secondary | ICD-10-CM

## 2020-04-22 DIAGNOSIS — N3000 Acute cystitis without hematuria: Secondary | ICD-10-CM | POA: Diagnosis present

## 2020-04-22 DIAGNOSIS — E039 Hypothyroidism, unspecified: Secondary | ICD-10-CM | POA: Diagnosis present

## 2020-04-22 LAB — URINALYSIS, ROUTINE W REFLEX MICROSCOPIC
Bilirubin Urine: NEGATIVE
Glucose, UA: >=500 mg/dL — AB
Ketones, ur: NEGATIVE mg/dL
Nitrite: NEGATIVE
Protein, ur: NEGATIVE mg/dL
Specific Gravity, Urine: 1.021 (ref 1.005–1.030)
pH: 7 (ref 5.0–8.0)

## 2020-04-22 LAB — BASIC METABOLIC PANEL
Anion gap: 12 (ref 5–15)
BUN: 11 mg/dL (ref 8–23)
CO2: 24 mmol/L (ref 22–32)
Calcium: 9.7 mg/dL (ref 8.9–10.3)
Chloride: 96 mmol/L — ABNORMAL LOW (ref 98–111)
Creatinine, Ser: 0.94 mg/dL (ref 0.44–1.00)
GFR calc Af Amer: >60 mL/min (ref >60)
GFR calc non Af Amer: >60 mL/min (ref >60)
Glucose, Bld: 604 mg/dL (ref 70–99)
Potassium: 3.2 mmol/L — ABNORMAL LOW (ref 3.5–5.1)
Sodium: 132 mmol/L — ABNORMAL LOW (ref 135–145)

## 2020-04-22 LAB — CBC
HCT: 38.4 % (ref 36.0–46.0)
Hemoglobin: 13.2 g/dL (ref 12.0–15.0)
MCH: 31.4 pg (ref 26.0–34.0)
MCHC: 34.4 g/dL (ref 30.0–36.0)
MCV: 91.2 fL (ref 80.0–100.0)
Platelets: 84 10*3/uL — ABNORMAL LOW (ref 150–400)
RBC: 4.21 MIL/uL (ref 3.87–5.11)
RDW: 11.6 % (ref 11.5–15.5)
WBC: 3.7 10*3/uL — ABNORMAL LOW (ref 4.0–10.5)
nRBC: 0 % (ref 0.0–0.2)

## 2020-04-22 LAB — TSH: TSH: 2.076 u[IU]/mL (ref 0.350–4.500)

## 2020-04-22 LAB — CBG MONITORING, ED
Glucose-Capillary: 366 mg/dL — ABNORMAL HIGH (ref 70–99)
Glucose-Capillary: >600 mg/dL (ref 70–99)

## 2020-04-22 MED ORDER — INSULIN ASPART 100 UNIT/ML ~~LOC~~ SOLN
10.0000 [IU] | Freq: Once | SUBCUTANEOUS | Status: AC
  Administered 2020-04-22: 10 [IU] via INTRAVENOUS
  Filled 2020-04-22: qty 1

## 2020-04-22 MED ORDER — POTASSIUM CHLORIDE CRYS ER 20 MEQ PO TBCR
40.0000 meq | EXTENDED_RELEASE_TABLET | Freq: Once | ORAL | Status: AC
  Administered 2020-04-23: 40 meq via ORAL
  Filled 2020-04-22: qty 2

## 2020-04-22 MED ORDER — SODIUM CHLORIDE 0.9 % IV BOLUS
500.0000 mL | Freq: Once | INTRAVENOUS | Status: AC
  Administered 2020-04-22: 500 mL via INTRAVENOUS

## 2020-04-22 MED ORDER — POTASSIUM CHLORIDE 10 MEQ/100ML IV SOLN: 10.0000 meq | Freq: Once | INTRAVENOUS | Status: DC

## 2020-04-22 MED ORDER — INSULIN REGULAR(HUMAN) IN NACL 100-0.9 UT/100ML-% IV SOLN: INTRAVENOUS | Status: DC

## 2020-04-22 MED ORDER — INSULIN REGULAR BOLUS VIA INFUSION
10.0000 [IU] | Freq: Once | INTRAVENOUS | Status: DC
  Filled 2020-04-22: qty 10

## 2020-04-22 MED ORDER — SODIUM CHLORIDE 0.9 % IV SOLN
1.0000 g | Freq: Once | INTRAVENOUS | Status: AC
  Administered 2020-04-22: 1 g via INTRAVENOUS
  Filled 2020-04-22: qty 10

## 2020-04-22 MED ORDER — SODIUM CHLORIDE 0.9 % IV SOLN: INTRAVENOUS | Status: DC

## 2020-04-22 NOTE — ED Notes (Signed)
Date and time results received: 04/22/20 10:47 pm (use smartphrase ".now" to insert current time)  Test: glucose Critical Value: 604  Name of Provider Notified: Dr. Ruthy Dick  Orders Received? Or Actions Taken?: Giving the pt. Insulin and fluids.

## 2020-04-22 NOTE — ED Provider Notes (Signed)
[facility] EMERGENCY DEPARTMENT Provider Note   CSN: [id number] Arrival date & time: 04/22/20  2047     History Chief Complaint  Patient presents with  . Altered Mental Status  . Hyperglycemia    Shannon Herrera is a 64 y.o. female.  Patient brought in by EMS from home.  EMS states that patient is only oriented to name.  But however upon arrival here patient is fairly oriented and will follow commands.  Patient's family stated that when she is like this usually her blood sugars are high.  EMS got a blood sugar of 599.  Upon arrival here was greater than 600.  Patient states she has not been taking her diabetic medicines.  Patient is on NPH insulin and Metformin.  Past medical history significant for diabetes without complications hypertension and hypothyroidism.  Patient denies any nausea or vomiting.  Vital signs on presentation no fever.  Heart rate 96.  Blood pressure 172/99.  Oxygen saturation 99%.        Past Medical History:  Diagnosis Date  . Diabetes mellitus without complication ([place])   . Hypertension   . Hypothyroid     Patient Active Problem List   Diagnosis Date Noted  . SAH (subarachnoid hemorrhage) ([place]) 04/30/2019  . DKA, type 2, not at goal [facility]) 02/08/2019  . Status epilepticus ([place]) 02/08/2019  . Alcohol abuse 02/08/2019  . Acute metabolic encephalopathy 10/20/3233  . Diabetic acidosis without coma ([place])   . H/O ETOH abuse   . C. difficile colitis 07/05/2018  . Syncope and collapse 07/03/2018  . Hyperglycemia 01/22/2018  . Essential hypertension 01/22/2018  . Alcohol withdrawal syndrome with complication, with unspecified complication ([place]) 57/32/2025  . Elevated LFTs 01/22/2018  . Lactic acidosis 01/22/2018  . Hypothyroidism 01/22/2018  . Thrombocytopenia ([place]) 01/22/2018  . Hypomagnesemia 01/22/2018  . Acute renal injury ([place]) 01/22/2018    Past Surgical History:  Procedure Laterality Date  . denies    . IR ANGIO INTRA EXTRACRAN SEL COM  CAROTID INNOMINATE UNI L MOD SED  05/04/2019  . IR ANGIO INTRA EXTRACRAN SEL INTERNAL CAROTID UNI R MOD SED  05/04/2019  . IR ANGIO VERTEBRAL SEL VERTEBRAL UNI R MOD SED  05/04/2019  . IR US GUIDE VASC ACCESS RIGHT  05/04/2019     OB History   No obstetric history on file.     Family History  Problem Relation Age of Onset  . Hypertension Mother     Social History   Tobacco Use  . Smoking status: Never Smoker  . Smokeless tobacco: Never Used  Vaping Use  . Vaping Use: Never used  Substance Use Topics  . Alcohol use: Yes    Comment: intoxicated at this time  . Drug use: Never    Home Medications Prior to Admission medications   Medication Sig Start Date End Date Taking? Authorizing Provider  aspirin EC 325 MG tablet Take 325 mg by mouth daily.    [provider]  folic acid (FOLVITE) 1 MG tablet Take 1 tablet (1 mg total) by mouth daily. 02/08/19   Debbe Odea, MD  glucose monitoring kit (FREESTYLE) monitoring kit 1 each by Does not apply route 4 (four) times daily - after meals and at bedtime. 1 month Diabetic Testing Supplies for QAC-QHS accuchecks. 02/08/19   Debbe Odea, MD  ibuprofen (ADVIL) 200 MG tablet Take 200-400 mg by mouth every 6 (six) hours as needed for headache or mild pain.    [provider]  insulin  NPH-regular Human (NOVOLIN 70/30) (70-30) 100 UNIT/ML injection Inject 100 Units into the skin 2 (two) times daily with a meal.    [provider]  Insulin Pen Needle (TECHLITE PEN NEEDLES) 31G X 8 MM MISC 1 each by Does not apply route 2 (two) times a day. 02/08/19   Debbe Odea, MD  levETIRAcetam (KEPPRA) 750 MG tablet Take 1 tablet (750 mg total) by mouth 2 (two) times daily. 02/08/19   Debbe Odea, MD  levothyroxine (SYNTHROID, LEVOTHROID) 100 MCG tablet Take 1 tablet (100 mcg total) by mouth daily before breakfast. 07/06/18 04/30/19  Gladstone Lighter, MD  lisinopril-hydrochlorothiazide (ZESTORETIC) 20-25 MG tablet Take 1 tablet by  mouth daily.    [provider]  lovastatin (MEVACOR) 20 MG tablet Take 40 mg by mouth at bedtime.    [provider]  metFORMIN (GLUCOPHAGE) 1000 MG tablet Take 1,000 mg by mouth 2 (two) times daily with a meal.    [provider]  metoprolol tartrate (LOPRESSOR) 50 MG tablet Take 1 tablet (50 mg total) by mouth 2 (two) times daily. 02/08/19   Debbe Odea, MD  thiamine (VITAMIN B-1) 100 MG tablet Take 1 tablet (100 mg total) by mouth daily. 02/08/19   Debbe Odea, MD  traMADol (ULTRAM) 50 MG tablet Take 1 tablet (50 mg total) by mouth every 6 (six) hours as needed for moderate pain. 05/06/19   Earnie Larsson, MD    Allergies    Patient has no known allergies.  Review of Systems   Review of Systems  Constitutional: Negative for chills and fever.  HENT: Negative for rhinorrhea and sore throat.   Eyes: Negative for visual disturbance.  Respiratory: Negative for cough and shortness of breath.   Cardiovascular: Negative for chest pain and leg swelling.  Gastrointestinal: Negative for abdominal pain, diarrhea, nausea and vomiting.  Genitourinary: Negative for dysuria.  Musculoskeletal: Negative for back pain and neck pain.  Skin: Negative for rash.  Neurological: Negative for dizziness, light-headedness and headaches.  Hematological: Does not bruise/bleed easily.  Psychiatric/Behavioral: Positive for confusion.    Physical Exam Updated Vital Signs BP (!) 172/99   Pulse 96   Temp 98.2 F (36.8 C)   Resp 18   Ht 1.6 m (_0 )   Wt 61 kg   SpO2 99%   BMI 23.82 kg/m   Physical Exam Vitals and nursing note reviewed.  Constitutional:      General: She is not in acute distress.    Appearance: Normal appearance. She is well-developed.  HENT:     Head: Normocephalic and atraumatic.     Mouth/Throat:     Mouth: Mucous membranes are dry.  Eyes:     Extraocular Movements: Extraocular movements intact.     Conjunctiva/sclera: Conjunctivae normal.     Pupils:  Pupils are equal, round, and reactive to light.  Cardiovascular:     Rate and Rhythm: Normal rate and regular rhythm.     Heart sounds: No murmur heard.   Pulmonary:     Effort: Pulmonary effort is normal. No respiratory distress.     Breath sounds: Normal breath sounds.  Abdominal:     Palpations: Abdomen is soft.     Tenderness: There is no abdominal tenderness.  Musculoskeletal:        General: No swelling. Normal range of motion.     Cervical back: Normal range of motion and neck supple.  Skin:    General: Skin is warm and dry.  Neurological:  General: No focal deficit present.     Mental Status: She is alert.     Cranial Nerves: No cranial nerve deficit.     Sensory: No sensory deficit.     Motor: No weakness.     ED Results / Procedures / Treatments   Labs (all labs ordered are listed, but only abnormal results are displayed) Labs Reviewed  BASIC METABOLIC PANEL - Abnormal; Notable for the following components:      Result Value   Sodium 132 (*)    Potassium 3.2 (*)    Chloride 96 (*)    Glucose, Bld 604 (*)    All other components within normal limits  CBC - Abnormal; Notable for the following components:   WBC 3.7 (*)    Platelets 84 (*)    All other components within normal limits  URINALYSIS, ROUTINE W REFLEX MICROSCOPIC - Abnormal; Notable for the following components:   Color, Urine STRAW (*)    APPearance HAZY (*)    Glucose, UA >=500 (*)    Hgb urine dipstick SMALL (*)    Leukocytes,Ua MODERATE (*)    Bacteria, UA RARE (*)    All other components within normal limits  CBG MONITORING, ED - Abnormal; Notable for the following components:   Glucose-Capillary >600 (*)    All other components within normal limits  URINE CULTURE  SARS CORONAVIRUS 2 BY RT PCR (HOSPITAL ORDER, [facility])  TSH  CBG MONITORING, ED    EKG None  Radiology CT Head Wo Contrast  Result Date: 04/22/2020 CLINICAL DATA:  Altered level of  consciousness EXAM: CT HEAD WITHOUT CONTRAST TECHNIQUE: Contiguous axial images were obtained from the base of the skull through the vertex without intravenous contrast. COMPARISON:  04/30/2019 FINDINGS: Brain: No acute infarct or hemorrhage. Lateral ventricles and midline structures are unremarkable. No acute extra-axial fluid collections. No mass effect. Vascular: No hyperdense vessel or unexpected calcification. Skull: Normal. Negative for fracture or focal lesion. Sinuses/Orbits: No acute finding. Other: None. IMPRESSION: 1. No acute intracranial process. Electronically Signed   By: Randa Ngo M.D.   On: 04/22/2020 22:57   DG Chest Port 1 View  Result Date: 04/22/2020 CLINICAL DATA:  64 year female with altered mental status. EXAM: PORTABLE CHEST 1 VIEW COMPARISON:  Chest radiograph dated 05/01/2019. FINDINGS: The lungs are clear. There is no pleural effusion or pneumothorax. The cardiac silhouette is within limits. Atherosclerotic calcification of the aorta. No acute osseous pathology. IMPRESSION: No active disease. Electronically Signed   By: Anner Crete M.D.   On: 04/22/2020 22:44    Procedures Procedures (including critical care time)  CRITICAL CARE Performed by: Fredia Sorrow Total critical care time: 35 minutes Critical care time was exclusive of separately billable procedures and treating other patients. Critical care was necessary to treat or prevent imminent or life-threatening deterioration. Critical care was time spent personally by me on the following activities: development of treatment plan with patient and/or surrogate as well as nursing, discussions with consultants, evaluation of patient's response to treatment, examination of patient, obtaining history from patient or surrogate, ordering and performing treatments and interventions, ordering and review of laboratory studies, ordering and review of radiographic studies, pulse oximetry and re-evaluation of  patient's condition.   Medications Ordered in ED Medications  0.9 %  sodium chloride infusion ( Intravenous New Bag/Given 04/22/20 2304)  cefTRIAXone (ROCEPHIN) 1 g in sodium chloride 0.9 % 100 mL IVPB (has no administration in time range)  insulin regular, human (MYXREDLIN) 100 units/ 100 mL infusion (has no administration in time range)  sodium chloride 0.9 % bolus 500 mL (500 mLs Intravenous New Bag/Given 04/22/20 2304)  insulin aspart (novoLOG) injection 10 Units (10 Units Intravenous Given 04/22/20 2305)    ED Course  I have reviewed the triage vital signs and the nursing notes.  Pertinent labs & imaging results that were available during my care of the patient were reviewed by me and considered in my medical decision making (see chart for details).    MDM Rules/Calculators/A&P                         Patient's blood sugar over 600.  Patient given 10 units of insulin before we knew her exact glucose number.  But it came back over 600.  Also potassium was little low at 3.2.  Stated patient received IV insulin 10 units.  And then once we had formal labs back she received a 40 mg of potassium IV 40 mEq potassium p.o. IV fluids and started on insulin drip.  Labs without evidence of acidosis.  Was evidence of urinary tract infection.  Patient given a dose of Rocephin and urine sent for culture.  Sugars were high enough patient started on insulin drip.  This with hospitalist they will admit.  Final Clinical Impression(s) / ED Diagnoses Final diagnoses:  Hyperglycemia  Acute cystitis without hematuria    Rx / DC Orders ED Discharge Orders    None       Fredia Sorrow, MD 04/23/20 [phone]

## 2020-04-22 NOTE — ED Triage Notes (Signed)
[facility] - pt comes in from home, EMS states pt is altered, only oriented to name. EMS states that the last time she was like this was when she was hyperglycemic. CBG 599 with EMS, in triage CBG monitor says HI

## 2020-04-22 NOTE — H&P (Signed)
History and Physical    OCTAYVIA Shannon Herrera:[id number] DOB: 01/06/1956 DOA: 04/22/2020  PCP: Patient, No Pcp Per   Patient coming from: Home.  I have personally briefly reviewed patient's old medical records in [facility]  Chief Complaint: AMS and hyperglycemia.  HPI: Shannon Herrera is a 64 y.o. female with medical history significant of type 2 diabetes, hypertension, hypothyroidism who is brought to the emergency department via EMS due to confusion.  Patient's family members called EMS that the patient usually gets confused when she becomes very hyperglycemic.  Initial CBG done by EMS was finally 99 mg/dL.  She is still only oriented only to name and unable to provide further history.  ED Course: 98.2 F, pulse 96, respiration 18, blood pressure 172/99 mmHg and O2 sat 99% on room air.  In the ER she received 1 L of normal saline bolus and 10 units of insulin SQ. she was also given 1 g of Rocephin IVPB for abnormal urinalysis.  Urine culture was sent.  Review of Systems: As per HPI otherwise all other systems reviewed and are negative.  Past Medical History:  Diagnosis Date  . Diabetes mellitus without complication (HCC)   . Hypertension   . Hypothyroid     Past Surgical History:  Procedure Laterality Date  . denies    . IR ANGIO INTRA EXTRACRAN SEL COM CAROTID INNOMINATE UNI L MOD SED  05/04/2019  . IR ANGIO INTRA EXTRACRAN SEL INTERNAL CAROTID UNI R MOD SED  05/04/2019  . IR ANGIO VERTEBRAL SEL VERTEBRAL UNI R MOD SED  05/04/2019  . IR US GUIDE VASC ACCESS RIGHT  05/04/2019    Social History  reports that she has never smoked. She has never used smokeless tobacco. She reports current alcohol use. She reports that she does not use drugs.  No Known Allergies  Family History  Problem Relation Age of Onset  . Hypertension Mother    Prior to Admission medications   Medication Sig Start Date End Date Taking? Authorizing Provider  aspirin EC 325 MG tablet Take 325 mg by  mouth daily.    [provider]  folic acid (FOLVITE) 1 MG tablet Take 1 tablet (1 mg total) by mouth daily. 02/08/19   Calvert Cantor, MD  glucose monitoring kit (FREESTYLE) monitoring kit 1 each by Does not apply route 4 (four) times daily - after meals and at bedtime. 1 month Diabetic Testing Supplies for QAC-QHS accuchecks. 02/08/19   Calvert Cantor, MD  ibuprofen (ADVIL) 200 MG tablet Take 200-400 mg by mouth every 6 (six) hours as needed for headache or mild pain.    [provider]  insulin NPH-regular Human (NOVOLIN 70/30) (70-30) 100 UNIT/ML injection Inject 100 Units into the skin 2 (two) times daily with a meal.    [provider]  Insulin Pen Needle (TECHLITE PEN NEEDLES) 31G X 8 MM MISC 1 each by Does not apply route 2 (two) times a day. 02/08/19   Calvert Cantor, MD  levETIRAcetam (KEPPRA) 750 MG tablet Take 1 tablet (750 mg total) by mouth 2 (two) times daily. 02/08/19   Calvert Cantor, MD  levothyroxine (SYNTHROID, LEVOTHROID) 100 MCG tablet Take 1 tablet (100 mcg total) by mouth daily before breakfast. 07/06/18 04/30/19  Enid Baas, MD  lisinopril-hydrochlorothiazide (ZESTORETIC) 20-25 MG tablet Take 1 tablet by mouth daily.    [provider]  lovastatin (MEVACOR) 20 MG tablet Take 40 mg by mouth at bedtime.    [provider]  metFORMIN (GLUCOPHAGE) 1000 MG tablet Take 1,000 mg by mouth 2 (two) times daily with a meal.    [provider]  metoprolol tartrate (LOPRESSOR) 50 MG tablet Take 1 tablet (50 mg total) by mouth 2 (two) times daily. 02/08/19   Calvert Cantor, MD  thiamine (VITAMIN B-1) 100 MG tablet Take 1 tablet (100 mg total) by mouth daily. 02/08/19   Calvert Cantor, MD  traMADol (ULTRAM) 50 MG tablet Take 1 tablet (50 mg total) by mouth every 6 (six) hours as needed for moderate pain. 05/06/19   Julio Sicks, MD   Physical Exam: Vitals:   04/22/20 2057  BP: (!) 172/99  Pulse: 96  Resp: 18  Temp: 98.2 F (36.8 C)  SpO2:  99%  Weight: 61 kg  Height: 5\' 3"  (1.6 m)   Constitutional: NAD, calm, comfortable Eyes: PERRL, lids and conjunctivae normal ENMT: Mucous membranes are dry. Posterior pharynx clear of any exudate or lesions. Neck: normal, supple, no masses, no thyromegaly Respiratory: clear to auscultation bilaterally, no wheezing, no crackles. Normal respiratory effort. No accessory muscle use.  Cardiovascular: Regular rate and rhythm, no murmurs / rubs / gallops. No extremity edema. 2+ pedal pulses. No carotid bruits.  Abdomen: Nondistended.  Soft, no tenderness, no masses palpated. No hepatosplenomegaly. Bowel sounds positive.  Musculoskeletal: Generalized weakness.  No clubbing / cyanosis. Good ROM, no contractures. Skin: no rashes, lesions, ulcers on very limited otological semination. Neurologic: CN 2-12 grossly intact. Sensation intact, DTR normal. Strength 5/5 in all 4.  Psychiatric: Normal judgment and insight. Alert and oriented x 3. Normal mood.   Labs on Admission: I have personally reviewed following labs and imaging studies  CBC: Recent Labs  Lab 04/22/20 2128  WBC 3.7*  HGB 13.2  HCT 38.4  MCV 91.2  PLT 84*    Basic Metabolic Panel: Recent Labs  Lab 04/22/20 2128  NA 132*  K 3.2*  CL 96*  CO2 24  GLUCOSE 604*  BUN 11  CREATININE 0.94  CALCIUM 9.7    GFR: Estimated Creatinine Clearance: 49.4 mL/min (by C-G formula based on SCr of 0.94 mg/dL).  Liver Function Tests: No results for input(s): AST, ALT, ALKPHOS, BILITOT, PROT, ALBUMIN in the last 168 hours.  Urine analysis:    Component Value Date/Time   COLORURINE STRAW (A) 04/22/2020 2114   APPEARANCEUR HAZY (A) 04/22/2020 2114   LABSPEC 1.021 04/22/2020 2114   PHURINE 7.0 04/22/2020 2114   GLUCOSEU >=500 (A) 04/22/2020 2114   HGBUR SMALL (A) 04/22/2020 2114   BILIRUBINUR NEGATIVE 04/22/2020 2114   KETONESUR NEGATIVE 04/22/2020 2114   PROTEINUR NEGATIVE 04/22/2020 2114   NITRITE NEGATIVE 04/22/2020 2114    LEUKOCYTESUR MODERATE (A) 04/22/2020 2114    Radiological Exams on Admission: CT Head Wo Contrast  Result Date: 04/22/2020 CLINICAL DATA:  Altered level of consciousness EXAM: CT HEAD WITHOUT CONTRAST TECHNIQUE: Contiguous axial images were obtained from the base of the skull through the vertex without intravenous contrast. COMPARISON:  04/30/2019 FINDINGS: Brain: No acute infarct or hemorrhage. Lateral ventricles and midline structures are unremarkable. No acute extra-axial fluid collections. No mass effect. Vascular: No hyperdense vessel or unexpected calcification. Skull: Normal. Negative for fracture or focal lesion. Sinuses/Orbits: No acute finding. Other: None. IMPRESSION: 1. No acute intracranial process. Electronically Signed   By: Sharlet Salina M.D.   On: 04/22/2020 22:57   DG Chest Port 1 View  Result Date: 04/22/2020 CLINICAL DATA:  64 year female with altered mental status. EXAM: PORTABLE CHEST 1 VIEW  COMPARISON:  Chest radiograph dated 05/01/2019. FINDINGS: The lungs are clear. There is no pleural effusion or pneumothorax. The cardiac silhouette is within limits. Atherosclerotic calcification of the aorta. No acute osseous pathology. IMPRESSION: No active disease. Electronically Signed   By: Elgie Collard M.D.   On: 04/22/2020 22:44    EKG: Independently reviewed.   Assessment/Plan Principal Problem:   Nonketotic hyperglycinemia, type II (HCC) Observation/telemetry. Keep n.p.o. Continue IV fluids. CBG monitoring with RI coverage. Resume long-acting insulin a.m.  Active Problems:   Acute metabolic encephalopathy Continue hyperglycemia treatment. Neurochecks every 4 hours.    Essential hypertension Resume metoprolol and lisinopril/HCT once med rec confirmed    Hypothyroidism Continue levothyroxine med rec performed.    Abnormal urinalysis Hold ceftriaxone for now. Follow urine culture result. Resume ceftriaxone if culture becomes positive     DVT  prophylaxis: SCDs. Code Status:   Full code. Family Communication: Disposition Plan:   Patient is from:  Home.  Anticipated DC to:  Home.  Anticipated DC date:  04/23/2020  Anticipated DC barriers: Clinical improvement.  Consults called: Admission status:  Telemetry/observation.   Severity of Illness: High.  Bobette Mo MD [facility]  How to contact the [facility] Attending or Consulting provider 7A - 7P or covering provider during after hours 7P -7A, for this patient?   1. Check the care team in [facility] and look for a) attending/consulting [facility] provider listed and b) the [facility] team listed 2. Log into [email or web address] and use 's universal password to access. If you do not have the password, please contact the hospital operator. 3. Locate the [facility] provider you are looking for under [facility] and page to a number that you can be directly reached. 4. If you still have difficulty reaching the provider, please page the [facility] (Director on Call) for the Hospitalists listed on amion for assistance.  04/22/2020, 11:58 PM   This document was prepared using Dragon voice recognition software and may contain some unintended transcription errors.

## 2020-04-23 ENCOUNTER — Encounter (HOSPITAL_COMMUNITY): Payer: Self-pay | Admitting: Internal Medicine

## 2020-04-23 ENCOUNTER — Other Ambulatory Visit: Payer: Self-pay

## 2020-04-23 DIAGNOSIS — Z794 Long term (current) use of insulin: Secondary | ICD-10-CM | POA: Diagnosis not present

## 2020-04-23 DIAGNOSIS — F0390 Unspecified dementia without behavioral disturbance: Secondary | ICD-10-CM | POA: Diagnosis present

## 2020-04-23 DIAGNOSIS — E039 Hypothyroidism, unspecified: Secondary | ICD-10-CM | POA: Diagnosis present

## 2020-04-23 DIAGNOSIS — G9341 Metabolic encephalopathy: Secondary | ICD-10-CM | POA: Diagnosis present

## 2020-04-23 DIAGNOSIS — G40909 Epilepsy, unspecified, not intractable, without status epilepticus: Secondary | ICD-10-CM | POA: Diagnosis present

## 2020-04-23 DIAGNOSIS — N3000 Acute cystitis without hematuria: Secondary | ICD-10-CM | POA: Diagnosis present

## 2020-04-23 DIAGNOSIS — E729 Disorder of amino-acid metabolism, unspecified: Secondary | ICD-10-CM | POA: Diagnosis not present

## 2020-04-23 DIAGNOSIS — D696 Thrombocytopenia, unspecified: Secondary | ICD-10-CM | POA: Diagnosis present

## 2020-04-23 DIAGNOSIS — Z79890 Hormone replacement therapy (postmenopausal): Secondary | ICD-10-CM | POA: Diagnosis not present

## 2020-04-23 DIAGNOSIS — E1100 Type 2 diabetes mellitus with hyperosmolarity without nonketotic hyperglycemic-hyperosmolar coma (NKHHC): Secondary | ICD-10-CM | POA: Diagnosis present

## 2020-04-23 DIAGNOSIS — B9620 Unspecified Escherichia coli [E. coli] as the cause of diseases classified elsewhere: Secondary | ICD-10-CM | POA: Diagnosis present

## 2020-04-23 DIAGNOSIS — Z79899 Other long term (current) drug therapy: Secondary | ICD-10-CM | POA: Diagnosis not present

## 2020-04-23 DIAGNOSIS — I10 Essential (primary) hypertension: Secondary | ICD-10-CM | POA: Diagnosis present

## 2020-04-23 DIAGNOSIS — Z7982 Long term (current) use of aspirin: Secondary | ICD-10-CM | POA: Diagnosis not present

## 2020-04-23 DIAGNOSIS — Z20822 Contact with and (suspected) exposure to covid-19: Secondary | ICD-10-CM | POA: Diagnosis present

## 2020-04-23 LAB — GLUCOSE, CAPILLARY
Glucose-Capillary: 318 mg/dL — ABNORMAL HIGH (ref 70–99)
Glucose-Capillary: 260 mg/dL — ABNORMAL HIGH (ref 70–99)
Glucose-Capillary: 291 mg/dL — ABNORMAL HIGH (ref 70–99)
Glucose-Capillary: 167 mg/dL — ABNORMAL HIGH (ref 70–99)
Glucose-Capillary: 133 mg/dL — ABNORMAL HIGH (ref 70–99)
Glucose-Capillary: 162 mg/dL — ABNORMAL HIGH (ref 70–99)

## 2020-04-23 LAB — CBC
HCT: 36.1 % (ref 36.0–46.0)
Hemoglobin: 12.5 g/dL (ref 12.0–15.0)
MCH: 31.6 pg (ref 26.0–34.0)
MCHC: 34.6 g/dL (ref 30.0–36.0)
MCV: 91.4 fL (ref 80.0–100.0)
Platelets: 90 10*3/uL — ABNORMAL LOW (ref 150–400)
RBC: 3.95 MIL/uL (ref 3.87–5.11)
RDW: 11.8 % (ref 11.5–15.5)
WBC: 6.2 10*3/uL (ref 4.0–10.5)
nRBC: 0 % (ref 0.0–0.2)

## 2020-04-23 LAB — HEPATIC FUNCTION PANEL
ALT: 22 U/L (ref 0–44)
AST: 22 U/L (ref 15–41)
Albumin: 4.3 g/dL (ref 3.5–5.0)
Alkaline Phosphatase: 85 U/L (ref 38–126)
Bilirubin, Direct: 0.1 mg/dL (ref 0.0–0.2)
Indirect Bilirubin: 0.5 mg/dL (ref 0.3–0.9)
Total Bilirubin: 0.6 mg/dL (ref 0.3–1.2)
Total Protein: 7.3 g/dL (ref 6.5–8.1)

## 2020-04-23 LAB — BASIC METABOLIC PANEL
Anion gap: 10 (ref 5–15)
BUN: 8 mg/dL (ref 8–23)
CO2: 21 mmol/L — ABNORMAL LOW (ref 22–32)
Calcium: 9.1 mg/dL (ref 8.9–10.3)
Chloride: 104 mmol/L (ref 98–111)
Creatinine, Ser: 0.72 mg/dL (ref 0.44–1.00)
GFR calc Af Amer: >60 mL/min (ref >60)
GFR calc non Af Amer: >60 mL/min (ref >60)
Glucose, Bld: 334 mg/dL — ABNORMAL HIGH (ref 70–99)
Potassium: 3.7 mmol/L (ref 3.5–5.1)
Sodium: 135 mmol/L (ref 135–145)

## 2020-04-23 LAB — SARS CORONAVIRUS 2 BY RT PCR (HOSPITAL ORDER, PERFORMED IN ~~LOC~~ HOSPITAL LAB): SARS Coronavirus 2: NEGATIVE

## 2020-04-23 LAB — HEMOGLOBIN A1C
Hgb A1c MFr Bld: 12.1 % — ABNORMAL HIGH (ref 4.8–5.6)
Mean Plasma Glucose: 300.57 mg/dL

## 2020-04-23 LAB — PHOSPHORUS: Phosphorus: 3.4 mg/dL (ref 2.5–4.6)

## 2020-04-23 LAB — MAGNESIUM: Magnesium: 1.9 mg/dL (ref 1.7–2.4)

## 2020-04-23 MED ORDER — INSULIN ASPART 100 UNIT/ML ~~LOC~~ SOLN
3.0000 [IU] | Freq: Three times a day (TID) | SUBCUTANEOUS | Status: DC
  Administered 2020-04-25 – 2020-04-26 (×5): 3 [IU] via SUBCUTANEOUS

## 2020-04-23 MED ORDER — LISINOPRIL 10 MG PO TABS
20.0000 mg | ORAL_TABLET | Freq: Every day | ORAL | Status: DC
  Administered 2020-04-23 – 2020-04-26 (×3): 20 mg via ORAL
  Filled 2020-04-23 (×4): qty 2

## 2020-04-23 MED ORDER — INSULIN GLARGINE 100 UNIT/ML ~~LOC~~ SOLN
12.0000 [IU] | Freq: Every day | SUBCUTANEOUS | Status: DC
  Administered 2020-04-24 – 2020-04-26 (×3): 12 [IU] via SUBCUTANEOUS
  Filled 2020-04-23 (×7): qty 0.12

## 2020-04-23 MED ORDER — METOPROLOL TARTRATE 50 MG PO TABS
50.0000 mg | ORAL_TABLET | Freq: Two times a day (BID) | ORAL | Status: DC
  Administered 2020-04-23 – 2020-04-26 (×6): 50 mg via ORAL
  Filled 2020-04-23 (×7): qty 1

## 2020-04-23 MED ORDER — AMLODIPINE BESYLATE 5 MG PO TABS
10.0000 mg | ORAL_TABLET | Freq: Every day | ORAL | Status: DC
  Administered 2020-04-23 – 2020-04-26 (×4): 10 mg via ORAL
  Filled 2020-04-23 (×5): qty 2

## 2020-04-23 MED ORDER — LISINOPRIL-HYDROCHLOROTHIAZIDE 20-25 MG PO TABS: 1.0000 | ORAL_TABLET | Freq: Every day | ORAL | Status: DC

## 2020-04-23 MED ORDER — HYDROCHLOROTHIAZIDE 25 MG PO TABS
25.0000 mg | ORAL_TABLET | Freq: Every day | ORAL | Status: DC
  Administered 2020-04-23 – 2020-04-24 (×2): 25 mg via ORAL
  Filled 2020-04-23 (×2): qty 1

## 2020-04-23 MED ORDER — LEVOTHYROXINE SODIUM 88 MCG PO TABS
88.0000 ug | ORAL_TABLET | Freq: Every day | ORAL | Status: DC
  Administered 2020-04-24 – 2020-04-26 (×3): 88 ug via ORAL
  Filled 2020-04-23 (×3): qty 1

## 2020-04-23 MED ORDER — POTASSIUM CHLORIDE CRYS ER 20 MEQ PO TBCR
20.0000 meq | EXTENDED_RELEASE_TABLET | Freq: Once | ORAL | Status: AC
  Administered 2020-04-23: 20 meq via ORAL
  Filled 2020-04-23: qty 1

## 2020-04-23 MED ORDER — POTASSIUM CHLORIDE IN NACL 20-0.9 MEQ/L-% IV SOLN: INTRAVENOUS | Status: DC

## 2020-04-23 MED ORDER — ACETAMINOPHEN 325 MG PO TABS
650.0000 mg | ORAL_TABLET | Freq: Four times a day (QID) | ORAL | Status: DC | PRN
  Administered 2020-04-23: 650 mg via ORAL
  Filled 2020-04-23: qty 2

## 2020-04-23 MED ORDER — INSULIN ASPART 100 UNIT/ML ~~LOC~~ SOLN: 4.0000 [IU] | Freq: Three times a day (TID) | SUBCUTANEOUS | Status: DC

## 2020-04-23 MED ORDER — INSULIN ASPART 100 UNIT/ML ~~LOC~~ SOLN
0.0000 [IU] | Freq: Three times a day (TID) | SUBCUTANEOUS | Status: DC
  Administered 2020-04-23: 8 [IU] via SUBCUTANEOUS
  Administered 2020-04-23: 3 [IU] via SUBCUTANEOUS
  Administered 2020-04-23: 2 [IU] via SUBCUTANEOUS
  Administered 2020-04-24: 8 [IU] via SUBCUTANEOUS
  Administered 2020-04-24: 3 [IU] via SUBCUTANEOUS
  Administered 2020-04-24: 2 [IU] via SUBCUTANEOUS
  Administered 2020-04-25 (×2): 3 [IU] via SUBCUTANEOUS
  Administered 2020-04-25: 8 [IU] via SUBCUTANEOUS
  Administered 2020-04-26 (×2): 3 [IU] via SUBCUTANEOUS

## 2020-04-23 MED ORDER — FENTANYL CITRATE (PF) 100 MCG/2ML IJ SOLN
25.0000 ug | Freq: Once | INTRAMUSCULAR | Status: AC
  Administered 2020-04-23: 25 ug via INTRAVENOUS
  Filled 2020-04-23: qty 2

## 2020-04-23 MED ORDER — INSULIN ASPART 100 UNIT/ML ~~LOC~~ SOLN
6.0000 [IU] | Freq: Once | SUBCUTANEOUS | Status: AC
  Administered 2020-04-23: 6 [IU] via SUBCUTANEOUS

## 2020-04-23 MED ORDER — INSULIN ASPART 100 UNIT/ML ~~LOC~~ SOLN: 0.0000 [IU] | Freq: Every day | SUBCUTANEOUS | Status: DC

## 2020-04-23 MED ORDER — INSULIN GLARGINE 100 UNIT/ML ~~LOC~~ SOLN
15.0000 [IU] | Freq: Every day | SUBCUTANEOUS | Status: DC
  Filled 2020-04-23 (×2): qty 0.15

## 2020-04-23 MED ORDER — PROCHLORPERAZINE EDISYLATE 10 MG/2ML IJ SOLN: 5.0000 mg | INTRAMUSCULAR | Status: DC | PRN

## 2020-04-23 MED ORDER — TRAMADOL HCL 50 MG PO TABS
50.0000 mg | ORAL_TABLET | Freq: Four times a day (QID) | ORAL | Status: DC | PRN
  Administered 2020-04-23: 50 mg via ORAL
  Filled 2020-04-23: qty 1

## 2020-04-23 MED ORDER — LEVETIRACETAM 500 MG PO TABS
750.0000 mg | ORAL_TABLET | Freq: Two times a day (BID) | ORAL | Status: DC
  Administered 2020-04-23 – 2020-04-26 (×8): 750 mg via ORAL
  Filled 2020-04-23 (×8): qty 1

## 2020-04-23 NOTE — Care Management Important Message (Deleted)
Important Message  Patient Details  Name: Shannon Herrera MRN: [id number] Date of Birth: 1956/05/08   Medicare Important Message Given:  Yes     [name] 04/23/2020, 4:42 PM

## 2020-04-23 NOTE — Plan of Care (Signed)
°  Problem: Acute Rehab PT Goals(only PT should resolve) Goal: Pt Will Perform Standing Balance Or Pre-Gait Flowsheets (Taken 04/23/2020 1514) Pt will perform standing balance or pre-gait:  with Modified Independent  with no UE support Goal: Pt Will Ambulate Flowsheets (Taken 04/23/2020 1513) Pt will Ambulate:  > 125 feet  Independently Goal: Pt Will Go Up/Down Stairs Flowsheets (Taken 04/23/2020 1515) Pt will Go Up / Down Stairs:  3-5 stairs  with modified independence

## 2020-04-23 NOTE — Evaluation (Signed)
Physical Therapy Evaluation Patient Details Name: Shannon Herrera MRN: [id number] DOB: 07-06-1956 Today's Date: 04/23/2020   History of Present Illness  Shannon Herrera is a 64 y.o. female with medical history significant of type 2 diabetes, hypertension, hypothyroidism who is brought to the emergency department via EMS due to confusion.  Patient's family members called EMS that the patient usually gets confused when she becomes very hyperglycemic.  Initial CBG done by EMS was finally 99 mg/dL.  She is still only oriented only to name and unable to provide further history.  Clinical Impression  PT steady with rolling walker but normally ambulates without assistive device.  Pt able to ambulate 50 ft without assistive device without losing her balance but gait was unsteady.  Recommend ambulation with a cane for safety for now.    Follow Up Recommendations  Pt should progress enough to not need any therapy.  If needed recommend OP    Equipment Recommendations  Cane    Recommendations for Other Services   none    Precautions / Restrictions Precautions Precautions: None Restrictions Weight Bearing Restrictions: No      Mobility  Bed Mobility Overal bed mobility: Modified Independent                Transfers Overall transfer level: Modified independent                  Ambulation/Gait Ambulation/Gait assistance: Modified independent (Device/Increase time) Gait Distance (Feet): 100 Feet (50 ft with RW; 50 ft with no assistive device) Assistive device: Rolling walker (2 wheeled) Gait Pattern/deviations: WFL(Within Functional Limits)   Gait velocity interpretation: 1.31 - 2.62 ft/sec, indicative of limited community ambulator             Pertinent Vitals/Pain Pain Assessment: Faces Faces Pain Scale: Hurts even more Pain Location: headache pt has already been medicated Pain Descriptors / Indicators: Aching Pain Intervention(s): Limited activity within  patient's tolerance    Home Living Family/patient expects to be discharged to:: Private residence Living Arrangements: Spouse/significant other Available Help at Discharge: Family Type of Home: Apartment Home Access: Stairs to enter   Secretary/administrator of Steps: 3 Home Layout: One level Home Equipment: None      Prior Function Level of Independence: Independent               Hand Dominance   Dominant Hand: Right    Extremity/Trunk Assessment   Upper Extremity Assessment Upper Extremity Assessment: Overall WFL for tasks assessed    Lower Extremity Assessment Lower Extremity Assessment: Overall WFL for tasks assessed       Communication   Communication: No difficulties  Cognition Arousal/Alertness: Lethargic Behavior During Therapy: WFL for tasks assessed/performed Overall Cognitive Status: Impaired/Different from baseline Area of Impairment: Problem solving                             Problem Solving: Slow processing;Decreased initiation;Requires verbal cues        General Comments      Exercises     Assessment/Plan    PT Assessment Patient needs continued PT services  PT Problem List Decreased balance;Decreased activity tolerance;Pain       PT Treatment Interventions Gait training;Therapeutic exercise;Balance training    PT Goals (Current goals can be found in the Care Plan section)  Acute Rehab PT Goals Patient Stated Goal: To go home PT Goal Formulation: With patient Time For Goal Achievement: 04/25/20 Potential to  Achieve Goals: Good    Frequency Min 3X/week   Barriers to discharge        Co-evaluation               AM-PAC PT "6 Clicks" Mobility  Outcome Measure Help needed turning from your back to your side while in a flat bed without using bedrails?: None Help needed moving from lying on your back to sitting on the side of a flat bed without using bedrails?: None Help needed moving to and from a bed to a  chair (including a wheelchair)?: None Help needed standing up from a chair using your arms (e.g., wheelchair or bedside chair)?: None Help needed to walk in hospital room?: None Help needed climbing 3-5 steps with a railing? : A Little 6 Click Score: 23    End of Session Equipment Utilized During Treatment: Gait belt Activity Tolerance: Patient limited by fatigue Patient left: in bed;with call bell/phone within reach;with nursing/sitter in room Nurse Communication: Mobility status PT Visit Diagnosis: Unsteadiness on feet (R26.81)    Time: 1610-9604 PT Time Calculation (min) (ACUTE ONLY): 33 min   Charges:   PT Evaluation $PT Eval Low Complexity: 1 Low          Virgina Organ, PT CLT [phone] 04/23/2020, 3:16 PM

## 2020-04-23 NOTE — Progress Notes (Addendum)
PROGRESS NOTE   Shannon Herrera  OZH:[id number] DOB: Mar 01, 1956 DOA: 04/22/2020 PCP: Patient, No Pcp Per   Chief Complaint  Patient presents with  . Altered Mental Status  . Hyperglycemia    Brief Admission History:  64 y.o. female with medical history significant of type 2 diabetes, hypertension, hypothyroidism who is brought to the emergency department via EMS due to confusion.  Patient's family members called EMS that the patient usually gets confused when she becomes very hyperglycemic.  Initial CBG done by EMS was finally 599 mg/dL.  She is still only oriented only to name and unable to provide further history.  She does admit that she is not taking her medications but not able to express the reason.   Assessment & Plan:   Principal Problem:   Nonketotic hyperglycinemia, type II (HCC) Active Problems:   Essential hypertension   Hypothyroidism   Acute metabolic encephalopathy   Seizure disorder (HCC)  1. Nonketotic hyperglycemia - Pt has been treated with IV fluids and insulin. Her BS is slowly coming down.  She is not stable enough to go home today.  I have ordered a sliding scale.  I ordered a dose of lantus.  CBG testing 5 times per day.   2. Uncontrolled type 2 DM - as evidenced by A1c 12%.  I have asked for diabetes coordinator to assist with discovering patient's  barriers to proper diabetes care.  3. Metabolic encephalopathy - continue supportive care as patient is not back to baseline mentation.  4. Essential hypertension - suboptimally controlled. Resume home meds.  Still waiting for home meds to be reconciled. I sent a second request for this to be done ASAP.  Pt not able to assist at all in reconciling her home meds.  5. Epilepsy - She has been on keppra in the past, not sure if she is still taking. Awaiting for home meds to be reconciled.  6. Hypothyroidism - unsure of proper current dose, would restart after meds have been clarified and reconciled.   7. Abnormal UA -  awaiting urine culture did not empirically treat with antibiotics.  8. Cognitive deficit - Pt seems confused and appears to have some dementia.  May benefit from outpatient neurology evaluation and screening for dementia and cognitive deficit.   DVT prophylaxis:  SCDs Code Status:  Full  Family Communication: tried to reach husband but unable to reach  Disposition:   Status is: INPATIENT   The patient will require care spanning > 2 midnights and should be moved to inpatient because: Altered mental status, IV treatments appropriate due to intensity of illness or inability to take PO and Inpatient level of care appropriate due to severity of illness  Dispo: The patient is from: Home              Anticipated d/c is to: Home              Anticipated d/c date is: 1 day              Patient currently is not medically stable to d/c.   Consultants:     Procedures:     Antimicrobials:     Subjective: Pt confused but says she hasn't been taking her meds but does not know why.  No specific complaints.   Objective: Vitals:   04/23/20 0050 04/23/20 0429 04/23/20 0900 04/23/20 1302  BP:  (!) 168/85 (!) 141/83 (!) 150/74  Pulse:  96 88 84  Resp: 20  16 16  Temp: 98.1 F (36.7 C) 98.9 F (37.2 C) 98.1 F (36.7 C) 98.2 F (36.8 C)  TempSrc: Oral Oral Oral Oral  SpO2:  99% 99% 99%  Weight:      Height:        Intake/Output Summary (Last 24 hours) at 04/23/2020 1556 Last data filed at 04/23/2020 4401 Gross per 24 hour  Intake 641.58 ml  Output --  Net 641.58 ml   Filed Weights   04/22/20 2057 04/23/20 0045  Weight: 61 kg 61 kg    Examination:  General exam: Appears calm and comfortable, frail elderly, appears older than stated age, MM dry.  Respiratory system: Clear to auscultation. Respiratory effort normal. Cardiovascular system: S1 & S2 heard, RRR. No JVD, murmurs, rubs, gallops or clicks. No pedal edema. Gastrointestinal system: Abdomen is nondistended, soft and  nontender. No organomegaly or masses felt. Normal bowel sounds heard. Central nervous system: Alert and oriented. No focal neurological deficits. Extremities: Symmetric 5 x 5 power. Skin: No rashes, lesions or ulcers Psychiatry: Judgement and insight appear poor. Mood & affect flat. Appears to have cognitive deficit.    Data Reviewed: I have personally reviewed following labs and imaging studies  CBC: Recent Labs  Lab 04/22/20 2128 04/23/20 0554  WBC 3.7* 6.2  HGB 13.2 12.5  HCT 38.4 36.1  MCV 91.2 91.4  PLT 84* 90*    Basic Metabolic Panel: Recent Labs  Lab 04/22/20 2128 04/23/20 0554  NA 132* 135  K 3.2* 3.7  CL 96* 104  CO2 24 21*  GLUCOSE 604* 334*  BUN 11 8  CREATININE 0.94 0.72  CALCIUM 9.7 9.1  MG 1.9  --   PHOS 3.4  --     GFR: Estimated Creatinine Clearance: 58 mL/min (by C-G formula based on SCr of 0.72 mg/dL).  Liver Function Tests: Recent Labs  Lab 04/22/20 2128  AST 22  ALT 22  ALKPHOS 85  BILITOT 0.6  PROT 7.3  ALBUMIN 4.3    CBG: Recent Labs  Lab 04/22/20 2347 04/23/20 0048 04/23/20 0430 04/23/20 0735 04/23/20 1114  GLUCAP 366* 291* 318* 260* 167*    Recent Results (from the past 240 hour(s))  SARS Coronavirus 2 by RT PCR (hospital order, performed in [facility] hospital lab) Nasopharyngeal Nasopharyngeal Swab     Status: None   Collection Time: 04/22/20 11:55 PM   Specimen: Nasopharyngeal Swab  Result Value Ref Range Status   SARS Coronavirus 2 NEGATIVE NEGATIVE Final    Comment: (NOTE) SARS-CoV-2 target nucleic acids are NOT DETECTED.  The SARS-CoV-2 RNA is generally detectable in upper and lower respiratory specimens during the acute phase of infection. The lowest concentration of SARS-CoV-2 viral copies this assay can detect is 250 copies / mL. A negative result does not preclude SARS-CoV-2 infection and should not be used as the sole basis for treatment or other patient management decisions.  A negative result may occur  with improper specimen collection / handling, submission of specimen other than nasopharyngeal swab, presence of viral mutation(s) within the areas targeted by this assay, and inadequate number of viral copies (<250 copies / mL). A negative result must be combined with clinical observations, patient history, and epidemiological information.  Fact Sheet for Patients:   BoilerBrush.com.cy  Fact Sheet for Healthcare Providers: https://pope.com/  This test is not yet approved or  cleared by the [place] FDA and has been authorized for detection and/or diagnosis of SARS-CoV-2 by FDA under an Emergency Use Authorization (EUA).  This EUA  will remain in effect (meaning this test can be used) for the duration of the COVID-19 declaration under Section 564(b)(1) of the Act, 21 U.S.C. section 360bbb-3(b)(1), unless the authorization is terminated or revoked sooner.  Performed at [facility], [address], [place], [place] [address]      Radiology Studies: CT Head Wo Contrast  Result Date: 04/22/2020 CLINICAL DATA:  Altered level of consciousness EXAM: CT HEAD WITHOUT CONTRAST TECHNIQUE: Contiguous axial images were obtained from the base of the skull through the vertex without intravenous contrast. COMPARISON:  04/30/2019 FINDINGS: Brain: No acute infarct or hemorrhage. Lateral ventricles and midline structures are unremarkable. No acute extra-axial fluid collections. No mass effect. Vascular: No hyperdense vessel or unexpected calcification. Skull: Normal. Negative for fracture or focal lesion. Sinuses/Orbits: No acute finding. Other: None. IMPRESSION: 1. No acute intracranial process. Electronically Signed   By: Sharlet Salina M.D.   On: 04/22/2020 22:57   DG Chest Port 1 View  Result Date: 04/22/2020 CLINICAL DATA:  64 year female with altered mental status. EXAM: PORTABLE CHEST 1 VIEW COMPARISON:  Chest radiograph dated 05/01/2019.  FINDINGS: The lungs are clear. There is no pleural effusion or pneumothorax. The cardiac silhouette is within limits. Atherosclerotic calcification of the aorta. No acute osseous pathology. IMPRESSION: No active disease. Electronically Signed   By: Elgie Collard M.D.   On: 04/22/2020 22:44   Scheduled Meds: . insulin aspart  0-15 Units Subcutaneous TID WC  . insulin aspart  0-5 Units Subcutaneous QHS  . insulin aspart  3 Units Subcutaneous TID WC  . [START ON 04/24/2020] insulin glargine  12 Units Subcutaneous Daily  . levETIRAcetam  750 mg Oral BID   Continuous Infusions: . 0.9 % NaCl with KCl 20 mEq / L 100 mL/hr at 04/23/20 1509     LOS: 0 days   Time spent: 28 mins   Christiann Hagerty Laural Benes, MD How to contact the [facility] Attending or Consulting provider 7A - 7P or covering provider during after hours 7P -7A, for this patient?  1. Check the care team in [facility] and look for a) attending/consulting [facility] provider listed and b) the [facility] team listed 2. Log into [email or web address] and use [facility]'s universal password to access. If you do not have the password, please contact the hospital operator. 3. Locate the [facility] provider you are looking for under [facility] and page to a number that you can be directly reached. 4. If you still have difficulty reaching the provider, please page the [facility] (Director on Call) for the Hospitalists listed on amion for assistance.  04/23/2020, 3:56 PM

## 2020-04-23 NOTE — Care Management Obs Status (Signed)
MEDICARE OBSERVATION STATUS NOTIFICATION   Patient Details  Name: Shannon Herrera MRN: [id number] Date of Birth: [birth year]   Medicare Observation Status Notification Given:  Yes    [name] 04/23/2020, 4:43 PM

## 2020-04-24 DIAGNOSIS — N3000 Acute cystitis without hematuria: Secondary | ICD-10-CM

## 2020-04-24 DIAGNOSIS — E1100 Type 2 diabetes mellitus with hyperosmolarity without nonketotic hyperglycemic-hyperosmolar coma (NKHHC): Principal | ICD-10-CM

## 2020-04-24 LAB — AMMONIA: Ammonia: 11 umol/L (ref 9–35)

## 2020-04-24 LAB — FOLATE: Folate: 14.8 ng/mL (ref >5.9)

## 2020-04-24 LAB — BASIC METABOLIC PANEL
Anion gap: 12 (ref 5–15)
BUN: 7 mg/dL — ABNORMAL LOW (ref 8–23)
CO2: 19 mmol/L — ABNORMAL LOW (ref 22–32)
Calcium: 9.2 mg/dL (ref 8.9–10.3)
Chloride: 102 mmol/L (ref 98–111)
Creatinine, Ser: 0.73 mg/dL (ref 0.44–1.00)
GFR calc Af Amer: >60 mL/min (ref >60)
GFR calc non Af Amer: >60 mL/min (ref >60)
Glucose, Bld: 286 mg/dL — ABNORMAL HIGH (ref 70–99)
Potassium: 4.3 mmol/L (ref 3.5–5.1)
Sodium: 133 mmol/L — ABNORMAL LOW (ref 135–145)

## 2020-04-24 LAB — GLUCOSE, CAPILLARY
Glucose-Capillary: 254 mg/dL — ABNORMAL HIGH (ref 70–99)
Glucose-Capillary: 266 mg/dL — ABNORMAL HIGH (ref 70–99)
Glucose-Capillary: 171 mg/dL — ABNORMAL HIGH (ref 70–99)
Glucose-Capillary: 135 mg/dL — ABNORMAL HIGH (ref 70–99)
Glucose-Capillary: 159 mg/dL — ABNORMAL HIGH (ref 70–99)

## 2020-04-24 LAB — VITAMIN B12: Vitamin B-12: 330 pg/mL (ref 180–914)

## 2020-04-24 LAB — MAGNESIUM: Magnesium: 1.3 mg/dL — ABNORMAL LOW (ref 1.7–2.4)

## 2020-04-24 MED ORDER — SODIUM CHLORIDE 0.9 % IV SOLN
1.0000 g | INTRAVENOUS | Status: DC
  Administered 2020-04-24 – 2020-04-25 (×2): 1 g via INTRAVENOUS
  Filled 2020-04-24 (×2): qty 10

## 2020-04-24 NOTE — Progress Notes (Signed)
PROGRESS NOTE  CHANDANI ROYS NGE:[id number] DOB: 02/27/1956 DOA: 04/22/2020 PCP: Patient, No Pcp Per  Brief History:  64 y.o.femalewith medical history significant oftype 2 diabetes, hypertension, hypothyroidism who is brought to the emergency department via EMS due to confusion. Patient's family members called EMS that the patient usually gets confused when she becomes very hyperglycemic. Initial CBG done by EMS was finally 599 mg/dL. She is still only oriented only to name and unable to provide further history.  She does admit that she is not taking her medications but not able to express the reason  Assessment/Plan: Hyperosmolar Nonketotic state -patient started on IV insulin with q 1 hour CBG check and q 4 hour BMPs -pt started on aggressive fluid resuscitation -Electrolytes were monitored and repleted -transitioned to [facility] insulin once anion gap closed -diet was advanced once anion gap closed -HbA1C--12.1  Uncontrolled Diabetes Mellitus type 2 with hyperglycemia -04/23/20--A1c 12.1 -Continue Lantus -continue novolog sliding scale -holding metformin  Acute Metabolic Encephalopathy -baseline patient is A&O x 3 and able to perform all ADLs -she is more alert, but clearly remains confused at present -due to [facility] and UTI -check B12 -TSH 2.076 -check folate -d/c tramadol  UTI -UA  21-50 WBC -start ceftriaxone pending culture data  Essential HTN -restart amlodipine, metoprolol  Hypothyroidism -continue synthroid -TSH 2.076  Seizure Disorder -continue Keppra     Status is: Inpatient  Remains inpatient appropriate because:Ongoing active pain requiring inpatient pain management and Altered mental status; remains confused   Dispo: The patient is from: Home              Anticipated d/c is to: Home              Anticipated d/c date is: 1 day              Patient currently is not medically stable to d/c.        Family Communication:   Updated  daughter 7/14  Consultants:  none  Code Status:  FULL   DVT Prophylaxis: [place] Lovenox   Procedures: As Listed in Progress Note Above  Antibiotics: Ceftriaxone 7/14>>>     Subjective: Pt cannot tell me where she is.  Oriented only to name.  Denies cp, sob, n/v/d  Objective: Vitals:   04/23/20 0900 04/23/20 1302 04/23/20 2039 04/24/20 0503  BP: (!) 141/83 (!) 150/74 (!) 175/95 (!) 152/90  Pulse: 88 84 69 84  Resp: 16 16 20 20   Temp: 98.1 F (36.7 C) 98.2 F (36.8 C) 98.2 F (36.8 C) 99.3 F (37.4 C)  TempSrc: Oral Oral Oral Oral  SpO2: 99% 99% 100% 97%  Weight:      Height:        Intake/Output Summary (Last 24 hours) at 04/24/2020 1219 Last data filed at 04/24/2020 1100 Gross per 24 hour  Intake 0 ml  Output 1200 ml  Net -1200 ml   Weight change:  Exam:   General:  Pt is alert, follows commands appropriately, not in acute distress  HEENT: No icterus, No thrush, No neck mass, [place]/AT  Cardiovascular: RRR, S1/S2, no rubs, no gallops  Respiratory: CTA bilaterally, no wheezing, no crackles, no rhonchi  Abdomen: Soft/+BS, non tender, non distended, no guarding  Extremities: No edema, No lymphangitis, No petechiae, No rashes, no synovitis   Data Reviewed: I have personally reviewed following labs and imaging studies Basic Metabolic Panel: Recent Labs  Lab 04/22/20 2128 04/23/20 0554 04/24/20  0600  NA 132* 135 133*  K 3.2* 3.7 4.3  CL 96* 104 102  CO2 24 21* 19*  GLUCOSE 604* 334* 286*  BUN 11 8 7*  CREATININE 0.94 0.72 0.73  CALCIUM 9.7 9.1 9.2  MG 1.9  --  1.3*  PHOS 3.4  --   --    Liver Function Tests: Recent Labs  Lab 04/22/20 2128  AST 22  ALT 22  ALKPHOS 85  BILITOT 0.6  PROT 7.3  ALBUMIN 4.3   No results for input(s): LIPASE, AMYLASE in the last 168 hours. No results for input(s): AMMONIA in the last 168 hours. Coagulation Profile: No results for input(s): INR, PROTIME in the last 168 hours. CBC: Recent Labs  Lab  04/22/20 2128 04/23/20 0554  WBC 3.7* 6.2  HGB 13.2 12.5  HCT 38.4 36.1  MCV 91.2 91.4  PLT 84* 90*   Cardiac Enzymes: No results for input(s): CKTOTAL, CKMB, CKMBINDEX, TROPONINI in the last 168 hours. BNP: Invalid input(s): POCBNP CBG: Recent Labs  Lab 04/23/20 1621 04/23/20 2035 04/24/20 0253 04/24/20 0747 04/24/20 1149  GLUCAP 133* 162* 254* 266* 171*   HbA1C: Recent Labs    04/23/20 0554  HGBA1C 12.1*   Urine analysis:    Component Value Date/Time   COLORURINE STRAW (A) 04/22/2020 2114   APPEARANCEUR HAZY (A) 04/22/2020 2114   LABSPEC 1.021 04/22/2020 2114   PHURINE 7.0 04/22/2020 2114   GLUCOSEU >=500 (A) 04/22/2020 2114   HGBUR SMALL (A) 04/22/2020 2114   BILIRUBINUR NEGATIVE 04/22/2020 2114   KETONESUR NEGATIVE 04/22/2020 2114   PROTEINUR NEGATIVE 04/22/2020 2114   NITRITE NEGATIVE 04/22/2020 2114   LEUKOCYTESUR MODERATE (A) 04/22/2020 2114   Sepsis Labs: @LABRCNTIP (procalcitonin:4,lacticidven:4) ) Recent Results (from the past 240 hour(s))  Urine Culture     Status: Abnormal (Preliminary result)   Collection Time: 04/22/20  9:14 PM   Specimen: Urine, Random  Result Value Ref Range Status   Specimen Description   Final    URINE, RANDOM Performed at [facility], [address] [place], [place] [address]    Special Requests   Final    NONE Performed at [facility], [address], [place], [place] [address]    Culture (A)  Final    >=100,000 COLONIES/mL GRAM NEGATIVE RODS SUSCEPTIBILITIES TO FOLLOW CULTURE REINCUBATED FOR BETTER GROWTH Performed at [facility], [address] [place], [place] [address]    Report Status PENDING  Incomplete  SARS Coronavirus 2 by RT PCR (hospital order, performed in [facility] hospital lab) Nasopharyngeal Nasopharyngeal Swab     Status: None   Collection Time: 04/22/20 11:55 PM   Specimen: Nasopharyngeal Swab  Result Value Ref Range Status   SARS Coronavirus 2 NEGATIVE NEGATIVE Final    Comment:  (NOTE) SARS-CoV-2 target nucleic acids are NOT DETECTED.  The SARS-CoV-2 RNA is generally detectable in upper and lower respiratory specimens during the acute phase of infection. The lowest concentration of SARS-CoV-2 viral copies this assay can detect is 250 copies / mL. A negative result does not preclude SARS-CoV-2 infection and should not be used as the sole basis for treatment or other patient management decisions.  A negative result may occur with improper specimen collection / handling, submission of specimen other than nasopharyngeal swab, presence of viral mutation(s) within the areas targeted by this assay, and inadequate number of viral copies (<250 copies / mL). A negative result must be combined with clinical observations, patient history, and epidemiological information.  Fact  Sheet for Patients:   BoilerBrush.com.cy  Fact Sheet for Healthcare Providers: https://pope.com/  This test is not yet approved or  cleared by the [place] FDA and has been authorized for detection and/or diagnosis of SARS-CoV-2 by FDA under an Emergency Use Authorization (EUA).  This EUA will remain in effect (meaning this test can be used) for the duration of the COVID-19 declaration under Section 564(b)(1) of the Act, 21 U.S.C. section 360bbb-3(b)(1), unless the authorization is terminated or revoked sooner.  Performed at [facility], [address], [place], [place] [address]      Scheduled Meds: . amLODipine  10 mg Oral Daily  . insulin aspart  0-15 Units Subcutaneous TID WC  . insulin aspart  0-5 Units Subcutaneous QHS  . insulin aspart  3 Units Subcutaneous TID WC  . insulin glargine  12 Units Subcutaneous Daily  . levETIRAcetam  750 mg Oral BID  . levothyroxine  88 mcg Oral QAC breakfast  . lisinopril  20 mg Oral Daily  . metoprolol tartrate  50 mg Oral BID   Continuous Infusions: . 0.9 % NaCl with KCl 20 mEq / L 75 mL/hr at  04/24/20 0520  . cefTRIAXone (ROCEPHIN)  IV      Procedures/Studies: CT Head Wo Contrast  Result Date: 04/22/2020 CLINICAL DATA:  Altered level of consciousness EXAM: CT HEAD WITHOUT CONTRAST TECHNIQUE: Contiguous axial images were obtained from the base of the skull through the vertex without intravenous contrast. COMPARISON:  04/30/2019 FINDINGS: Brain: No acute infarct or hemorrhage. Lateral ventricles and midline structures are unremarkable. No acute extra-axial fluid collections. No mass effect. Vascular: No hyperdense vessel or unexpected calcification. Skull: Normal. Negative for fracture or focal lesion. Sinuses/Orbits: No acute finding. Other: None. IMPRESSION: 1. No acute intracranial process. Electronically Signed   By: Sharlet Salina M.D.   On: 04/22/2020 22:57   DG Chest Port 1 View  Result Date: 04/22/2020 CLINICAL DATA:  64 year female with altered mental status. EXAM: PORTABLE CHEST 1 VIEW COMPARISON:  Chest radiograph dated 05/01/2019. FINDINGS: The lungs are clear. There is no pleural effusion or pneumothorax. The cardiac silhouette is within limits. Atherosclerotic calcification of the aorta. No acute osseous pathology. IMPRESSION: No active disease. Electronically Signed   By: Elgie Collard M.D.   On: 04/22/2020 22:44    Catarina Hartshorn, DO  [facility]  If 7PM-7AM, please contact night-coverage [email or web address] Password [email or web address] 04/24/2020, 12:19 PM   LOS: 1 day

## 2020-04-24 NOTE — Progress Notes (Addendum)
Inpatient Diabetes Program Recommendations  AACE/ADA: New Consensus Statement on Inpatient Glycemic Control  Target Ranges:  Prepandial:   less than 140 mg/dL      Peak postprandial:   less than 180 mg/dL (1-2 hours)      Critically ill patients:  140 - 180 mg/dL  Results for Shannon Herrera, Shannon Herrera (MRN [id number]) as of 04/24/2020 07:16  Ref. Range 04/24/2020 06:00  Glucose Latest Ref Range: 70 - 99 mg/dL 778 (H)   Results for Shannon Herrera, Shannon Herrera (MRN [id number]) as of 04/24/2020 07:16  Ref. Range 04/23/2020 07:35 04/23/2020 11:14 04/23/2020 16:21 04/23/2020 20:35 04/24/2020 02:53  Glucose-Capillary Latest Ref Range: 70 - 99 mg/dL 431 (H) 540 (H) 086 (H) 162 (H) 254 (H)   Results for Shannon Herrera, Shannon Herrera (MRN [id number]) as of 04/24/2020 07:16  Ref. Range 04/23/2020 05:54  Hemoglobin A1C Latest Ref Range: 4.8 - 5.6 % 12.1 (H)   Review of Glycemic Control  Diabetes history: DM2 Outpatient Diabetes medications: 70/30 100 units BID, Metformin 1000 mg BID Current orders for Inpatient glycemic control: Lantus 12 units daily (to start today), Novolog 0-15 units TID with meals, Novolog 0-5 units QHS, Novolog 3 units TID with meals  Inpatient Diabetes Program Recommendations:    Insulin-Noted Lantus 12 units daily ordered 04/23/20 to start today.  HbgA1C: A1C 12.1% on 04/23/20 indicating an average glucose of 301 mg/dl over the past 2-3 months.  NOTE: Noted consult for diabetes coordinator. Went by to speak with patient on 04/23/20 regarding DM control. Patient was initially asleep but awoke easily to name. Patient would answer questions with "yes" regardless of the question and at times would say "I am sorry. I don't know."  Not able to get a clear understanding of what DM medications patient is taking at home or if patient is checking glucose at home. Will try to talk with patient again today to see if more information can be obtained.  Addendum 04/24/20@11 :33-Per RN, patient with AMS and not oriented to self today.  Therefore, not able to get more information from patient regarding potential barriers to DM management at this time.  Thanks, Orlando Penner, RN, MSN, CDE Diabetes Coordinator Inpatient Diabetes Program [phone] (Team Pager from 8am to 5pm)

## 2020-04-24 NOTE — Progress Notes (Signed)
This morning could not state name or any other information.  Ate small container of pears on tray by just dumping them in her mouth.  At lunch knew name and ate a few green beans.with her hands.   Walks to bathroom with no difficulty but does does not use call light for assistance.  Bed alarm set.

## 2020-04-24 NOTE — Progress Notes (Signed)
Physical Therapy Treatment Patient Details Name: Shannon Herrera MRN: [id number] DOB: 1956/09/08 Today's Date: 04/24/2020    History of Present Illness Shannon Herrera is a 64 y.o. female with medical history significant of type 2 diabetes, hypertension, hypothyroidism who is brought to the emergency department via EMS due to confusion.  Patient's family members called EMS that the patient usually gets confused when she becomes very hyperglycemic.  Initial CBG done by EMS was finally 99 mg/dL.  She is still only oriented only to name and unable to provide further history.    PT Comments    Patient demonstrates good return for sitting up at bedside, transfers and ambulation in hallway without loss of balance without AD.  Patient requires repeated verbal cues for completing exercises possibly due to [facility] and/or slow processing with fair carryover.  Plan:  Patient discharged from physical therapy to care of nursing for ambulation daily as tolerated for length of stay - nursing staff notified.    Follow Up Recommendations  No PT follow up     Equipment Recommendations  None recommended by PT    Recommendations for Other Services       Precautions / Restrictions Precautions Precautions: None Restrictions Weight Bearing Restrictions: No    Mobility  Bed Mobility Overal bed mobility: Modified Independent                Transfers Overall transfer level: Modified independent                  Ambulation/Gait Ambulation/Gait assistance: Modified independent (Device/Increase time) Gait Distance (Feet): 200 Feet Assistive device: None Gait Pattern/deviations: WFL(Within Functional Limits) Gait velocity: slightly decreased   General Gait Details: increased endurance/distance for ambulation without loss of balance without use of an AD   Stairs             Wheelchair Mobility    Modified Rankin (Stroke Patients Only)       Balance Overall balance  assessment: Modified Independent                                          Cognition Arousal/Alertness: Awake/alert Behavior During Therapy: WFL for tasks assessed/performed Overall Cognitive Status: No family/caregiver present to determine baseline cognitive functioning                                        Exercises General Exercises - Lower Extremity Ankle Circles/Pumps: Seated;AROM;Strengthening;Both;15 reps Long Arc Quad: Seated;AROM;Strengthening;Both;20 reps    General Comments        Pertinent Vitals/Pain Pain Assessment: No/denies pain    Home Living                      Prior Function            PT Goals (current goals can now be found in the care plan section) Acute Rehab PT Goals Patient Stated Goal: To go home PT Goal Formulation: With patient Time For Goal Achievement: 04/24/20 Potential to Achieve Goals: Good Progress towards PT goals: Progressing toward goals    Frequency           PT Plan Other (comment) (discharging patient to care of nursing for ambulation)    Co-evaluation  AM-PAC PT "6 Clicks" Mobility   Outcome Measure  Help needed turning from your back to your side while in a flat bed without using bedrails?: None Help needed moving from lying on your back to sitting on the side of a flat bed without using bedrails?: None Help needed moving to and from a bed to a chair (including a wheelchair)?: None Help needed standing up from a chair using your arms (e.g., wheelchair or bedside chair)?: None Help needed to walk in hospital room?: None Help needed climbing 3-5 steps with a railing? : A Little 6 Click Score: 23    End of Session   Activity Tolerance: Patient tolerated treatment well Patient left: in chair;with call bell/phone within reach Nurse Communication: Mobility status PT Visit Diagnosis: Other abnormalities of gait and mobility (R26.89);Muscle weakness  (generalized) (M62.81);Unsteadiness on feet (R26.81)     Time: 6606-3016 PT Time Calculation (min) (ACUTE ONLY): 16 min  Charges:  $Therapeutic Activity: 8-22 mins                     2:18 PM, 04/24/20 Ocie Bob, MPT Physical Therapist with [facility] 336 [phone] office [phone] mobile phone

## 2020-04-25 LAB — CBC
HCT: 38.5 % (ref 36.0–46.0)
Hemoglobin: 12.6 g/dL (ref 12.0–15.0)
MCH: 31.2 pg (ref 26.0–34.0)
MCHC: 32.7 g/dL (ref 30.0–36.0)
MCV: 95.3 fL (ref 80.0–100.0)
Platelets: 104 10*3/uL — ABNORMAL LOW (ref 150–400)
RBC: 4.04 MIL/uL (ref 3.87–5.11)
RDW: 11.9 % (ref 11.5–15.5)
WBC: 4.7 10*3/uL (ref 4.0–10.5)
nRBC: 0 % (ref 0.0–0.2)

## 2020-04-25 LAB — BASIC METABOLIC PANEL
Anion gap: 8 (ref 5–15)
BUN: 7 mg/dL — ABNORMAL LOW (ref 8–23)
CO2: 21 mmol/L — ABNORMAL LOW (ref 22–32)
Calcium: 9.1 mg/dL (ref 8.9–10.3)
Chloride: 108 mmol/L (ref 98–111)
Creatinine, Ser: 0.68 mg/dL (ref 0.44–1.00)
GFR calc Af Amer: >60 mL/min (ref >60)
GFR calc non Af Amer: >60 mL/min (ref >60)
Glucose, Bld: 182 mg/dL — ABNORMAL HIGH (ref 70–99)
Potassium: 4.2 mmol/L (ref 3.5–5.1)
Sodium: 137 mmol/L (ref 135–145)

## 2020-04-25 LAB — GLUCOSE, CAPILLARY
Glucose-Capillary: 148 mg/dL — ABNORMAL HIGH (ref 70–99)
Glucose-Capillary: 162 mg/dL — ABNORMAL HIGH (ref 70–99)
Glucose-Capillary: 164 mg/dL — ABNORMAL HIGH (ref 70–99)
Glucose-Capillary: 171 mg/dL — ABNORMAL HIGH (ref 70–99)
Glucose-Capillary: 181 mg/dL — ABNORMAL HIGH (ref 70–99)
Glucose-Capillary: 197 mg/dL — ABNORMAL HIGH (ref 70–99)
Glucose-Capillary: 257 mg/dL — ABNORMAL HIGH (ref 70–99)

## 2020-04-25 LAB — MAGNESIUM: Magnesium: 1.4 mg/dL — ABNORMAL LOW (ref 1.7–2.4)

## 2020-04-25 MED ORDER — MAGNESIUM SULFATE 2 GM/50ML IV SOLN
2.0000 g | Freq: Once | INTRAVENOUS | Status: AC
  Administered 2020-04-25: 2 g via INTRAVENOUS
  Filled 2020-04-25: qty 50

## 2020-04-25 NOTE — Progress Notes (Signed)
More alert today and stated name and that she was in the hospital.  Knew birthday was in [birth year] but could not recall day or year and did not know current year.

## 2020-04-25 NOTE — Progress Notes (Addendum)
PROGRESS NOTE  Shannon Herrera ZOX:[id number] DOB: 26-Jul-1956 DOA: 04/22/2020 PCP: Patient, No Pcp Per  Brief History:  64 y.o.femalewith medical history significant oftype 2 diabetes, hypertension, hypothyroidism who is brought to the emergency department via EMS due to confusion. Patient's family members called EMS that the patient usually gets confused when she becomes very hyperglycemic. Initial CBG done by EMS was finally599 mg/dL. She is still only oriented only to name and unable to provide further history.She does admit that she is not taking her medications but not able to express the reason  Assessment/Plan: Hyperosmolar Nonketotic state -patient started on IV insulin with q 1 hour CBG check and q 4 hour BMPs -pt started on aggressive fluid resuscitation -Electrolytes were monitored and repleted -transitioned to [facility] insulin once anion gap closed -diet was advanced once anion gap closed -HbA1C--12.1  Uncontrolled Diabetes Mellitus type 2 with hyperglycemia -04/23/20--A1c 12.1 -Continue Lantus -continue novolog sliding scale -holding metformin  Acute Metabolic Encephalopathy -baseline patient is A&O x 3 and able to perform all ADLs -she is more alert but remains confused, not at baseline -due to [facility] and UTI -check B12--330 -TSH 2.076 -check folate--14.8 -d/c tramadol  UTI -UA  21-50 WBC -continue ceftriaxone pending culture data  Essential HTN -restart amlodipine, metoprolol  Hypothyroidism -continue synthroid -TSH 2.076  Seizure Disorder -continue Keppra  Hypomagnesemia -replete  Thrombocytopenia -chronic -monitor for signs of bleeding -acute worsening due to infection and dilution     Status is: Inpatient  Remains inpatient appropriate because:Ongoing active pain requiring inpatient pain management and Altered mental status; remains confused   Dispo: The patient is from: Home  Anticipated d/c is to:  Home  Anticipated d/c date is: 04/26/20  Patient currently is not medically stable to d/c.        Family Communication:   Updated daughter 7/15  Consultants:  none  Code Status:  FULL   DVT Prophylaxis: [facility] Lovenox   Procedures: As Listed in Progress Note Above  Antibiotics: Ceftriaxone 7/14>>>      Subjective: Patient denies fevers, chills, headache, chest pain, dyspnea, nausea, vomiting, diarrhea, abdominal pain, dysuria, hematuria, hematochezia, and melena.   Objective: Vitals:   04/24/20 0503 04/24/20 1543 04/24/20 2143 04/25/20 0530  BP: (!) 152/90 118/65 (!) 119/59 128/64  Pulse: 84 80 72 71  Resp: 20 16 15 16   Temp: 99.3 F (37.4 C) 98.3 F (36.8 C) 98.2 F (36.8 C) 97.8 F (36.6 C)  TempSrc: Oral Oral Oral Oral  SpO2: 97% 100% 100% 100%  Weight:      Height:        Intake/Output Summary (Last 24 hours) at 04/25/2020 1055 Last data filed at 04/25/2020 0300 Gross per 24 hour  Intake 3585.9 ml  Output 900 ml  Net 2685.9 ml   Weight change:  Exam:   General:  Pt is alert, follows commands appropriately, not in acute distress  HEENT: No icterus, No thrush, No neck mass, [place]/AT  Cardiovascular: RRR, S1/S2, no rubs, no gallops  Respiratory: CTA bilaterally, no wheezing, no crackles, no rhonchi  Abdomen: Soft/+BS, non tender, non distended, no guarding  Extremities: No edema, No lymphangitis, No petechiae, No rashes, no synovitis  Neuro:  CN II-XII intact, strength 4/5 in RUE, RLE, strength 4/5 LUE, LLE; sensation intact bilateral; no dysmetria; babinski equivocal     Data Reviewed: I have personally reviewed following labs and imaging studies Basic Metabolic Panel: Recent Labs  Lab 04/22/20 2128  04/23/20 0554 04/24/20 0600 04/25/20 0609  NA 132* 135 133* 137  K 3.2* 3.7 4.3 4.2  CL 96* 104 102 108  CO2 24 21* 19* 21*  GLUCOSE 604* 334* 286* 182*  BUN 11 8 7* 7*  CREATININE 0.94 0.72 0.73  0.68  CALCIUM 9.7 9.1 9.2 9.1  MG 1.9  --  1.3* 1.4*  PHOS 3.4  --   --   --    Liver Function Tests: Recent Labs  Lab 04/22/20 2128  AST 22  ALT 22  ALKPHOS 85  BILITOT 0.6  PROT 7.3  ALBUMIN 4.3   No results for input(s): LIPASE, AMYLASE in the last 168 hours. Recent Labs  Lab 04/24/20 1317  AMMONIA 11   Coagulation Profile: No results for input(s): INR, PROTIME in the last 168 hours. CBC: Recent Labs  Lab 04/22/20 2128 04/23/20 0554 04/25/20 0609  WBC 3.7* 6.2 4.7  HGB 13.2 12.5 12.6  HCT 38.4 36.1 38.5  MCV 91.2 91.4 95.3  PLT 84* 90* 104*   Cardiac Enzymes: No results for input(s): CKTOTAL, CKMB, CKMBINDEX, TROPONINI in the last 168 hours. BNP: Invalid input(s): POCBNP CBG: Recent Labs  Lab 04/24/20 2135 04/25/20 0027 04/25/20 0324 04/25/20 0735 04/25/20 1048  GLUCAP 159* 181* 162* 171* 257*   HbA1C: Recent Labs    04/23/20 0554  HGBA1C 12.1*   Urine analysis:    Component Value Date/Time   COLORURINE STRAW (A) 04/22/2020 2114   APPEARANCEUR HAZY (A) 04/22/2020 2114   LABSPEC 1.021 04/22/2020 2114   PHURINE 7.0 04/22/2020 2114   GLUCOSEU >=500 (A) 04/22/2020 2114   HGBUR SMALL (A) 04/22/2020 2114   BILIRUBINUR NEGATIVE 04/22/2020 2114   KETONESUR NEGATIVE 04/22/2020 2114   PROTEINUR NEGATIVE 04/22/2020 2114   NITRITE NEGATIVE 04/22/2020 2114   LEUKOCYTESUR MODERATE (A) 04/22/2020 2114   Sepsis Labs: @LABRCNTIP (procalcitonin:4,lacticidven:4) ) Recent Results (from the past 240 hour(s))  Urine Culture     Status: Abnormal (Preliminary result)   Collection Time: 04/22/20  9:14 PM   Specimen: Urine, Random  Result Value Ref Range Status   Specimen Description   Final    URINE, RANDOM Performed at [facility], [address], [place], [place] [address]    Special Requests   Final    NONE Performed at [facility], [address] [place], [place] [address]    Culture (A)  Final    >=100,000 COLONIES/mL ESCHERICHIA  COLI SUSCEPTIBILITIES TO FOLLOW CULTURE REINCUBATED FOR BETTER GROWTH Performed at [facility], [address], [place], [place] [address]    Report Status PENDING  Incomplete  SARS Coronavirus 2 by RT PCR (hospital order, performed in [facility] hospital lab) Nasopharyngeal Nasopharyngeal Swab     Status: None   Collection Time: 04/22/20 11:55 PM   Specimen: Nasopharyngeal Swab  Result Value Ref Range Status   SARS Coronavirus 2 NEGATIVE NEGATIVE Final    Comment: (NOTE) SARS-CoV-2 target nucleic acids are NOT DETECTED.  The SARS-CoV-2 RNA is generally detectable in upper and lower respiratory specimens during the acute phase of infection. The lowest concentration of SARS-CoV-2 viral copies this assay can detect is 250 copies / mL. A negative result does not preclude SARS-CoV-2 infection and should not be used as the sole basis for treatment or other patient management decisions.  A negative result may occur with improper specimen collection / handling, submission of specimen other than nasopharyngeal swab, presence of viral mutation(s) within the areas targeted by this assay, and inadequate  number of viral copies (<250 copies / mL). A negative result must be combined with clinical observations, patient history, and epidemiological information.  Fact Sheet for Patients:   BoilerBrush.com.cy  Fact Sheet for Healthcare Providers: https://pope.com/  This test is not yet approved or  cleared by the [place] FDA and has been authorized for detection and/or diagnosis of SARS-CoV-2 by FDA under an Emergency Use Authorization (EUA).  This EUA will remain in effect (meaning this test can be used) for the duration of the COVID-19 declaration under Section 564(b)(1) of the Act, 21 U.S.C. section 360bbb-3(b)(1), unless the authorization is terminated or revoked sooner.  Performed at [facility], [address] [place],  [place] [address]      Scheduled Meds: . amLODipine  10 mg Oral Daily  . insulin aspart  0-15 Units Subcutaneous TID WC  . insulin aspart  0-5 Units Subcutaneous QHS  . insulin aspart  3 Units Subcutaneous TID WC  . insulin glargine  12 Units Subcutaneous Daily  . levETIRAcetam  750 mg Oral BID  . levothyroxine  88 mcg Oral QAC breakfast  . lisinopril  20 mg Oral Daily  . metoprolol tartrate  50 mg Oral BID   Continuous Infusions: . 0.9 % NaCl with KCl 20 mEq / L 75 mL/hr at 04/25/20 0349  . cefTRIAXone (ROCEPHIN)  IV 1 g (04/24/20 1309)  . magnesium sulfate bolus IVPB 2 g (04/25/20 1029)    Procedures/Studies: CT Head Wo Contrast  Result Date: 04/22/2020 CLINICAL DATA:  Altered level of consciousness EXAM: CT HEAD WITHOUT CONTRAST TECHNIQUE: Contiguous axial images were obtained from the base of the skull through the vertex without intravenous contrast. COMPARISON:  04/30/2019 FINDINGS: Brain: No acute infarct or hemorrhage. Lateral ventricles and midline structures are unremarkable. No acute extra-axial fluid collections. No mass effect. Vascular: No hyperdense vessel or unexpected calcification. Skull: Normal. Negative for fracture or focal lesion. Sinuses/Orbits: No acute finding. Other: None. IMPRESSION: 1. No acute intracranial process. Electronically Signed   By: Sharlet Salina M.D.   On: 04/22/2020 22:57   DG Chest Port 1 View  Result Date: 04/22/2020 CLINICAL DATA:  64 year female with altered mental status. EXAM: PORTABLE CHEST 1 VIEW COMPARISON:  Chest radiograph dated 05/01/2019. FINDINGS: The lungs are clear. There is no pleural effusion or pneumothorax. The cardiac silhouette is within limits. Atherosclerotic calcification of the aorta. No acute osseous pathology. IMPRESSION: No active disease. Electronically Signed   By: Elgie Collard M.D.   On: 04/22/2020 22:44    Catarina Hartshorn, DO  [facility]  If 7PM-7AM, please contact night-coverage [email or web address] Password  [facility] 04/25/2020, 10:55 AM   LOS: 2 days

## 2020-04-26 LAB — URINE CULTURE: Culture: >=100000 — AB

## 2020-04-26 LAB — BASIC METABOLIC PANEL
Anion gap: 8 (ref 5–15)
BUN: 7 mg/dL — ABNORMAL LOW (ref 8–23)
CO2: 21 mmol/L — ABNORMAL LOW (ref 22–32)
Calcium: 8.9 mg/dL (ref 8.9–10.3)
Chloride: 109 mmol/L (ref 98–111)
Creatinine, Ser: 0.67 mg/dL (ref 0.44–1.00)
GFR calc Af Amer: >60 mL/min (ref >60)
GFR calc non Af Amer: >60 mL/min (ref >60)
Glucose, Bld: 151 mg/dL — ABNORMAL HIGH (ref 70–99)
Potassium: 4.1 mmol/L (ref 3.5–5.1)
Sodium: 138 mmol/L (ref 135–145)

## 2020-04-26 LAB — GLUCOSE, CAPILLARY
Glucose-Capillary: 129 mg/dL — ABNORMAL HIGH (ref 70–99)
Glucose-Capillary: 193 mg/dL — ABNORMAL HIGH (ref 70–99)
Glucose-Capillary: 199 mg/dL — ABNORMAL HIGH (ref 70–99)

## 2020-04-26 LAB — MAGNESIUM: Magnesium: 1.5 mg/dL — ABNORMAL LOW (ref 1.7–2.4)

## 2020-04-26 MED ORDER — NOVOLIN 70/30 (70-30) 100 UNIT/ML ~~LOC~~ SUSP: 18.0000 [IU] | Freq: Two times a day (BID) | SUBCUTANEOUS | 11 refills | Status: AC

## 2020-04-26 MED ORDER — MAGNESIUM OXIDE 400 (241.3 MG) MG PO TABS: 400.0000 mg | ORAL_TABLET | Freq: Every day | ORAL | 1 refills | Status: AC

## 2020-04-26 MED ORDER — INSULIN GLARGINE 100 UNIT/ML ~~LOC~~ SOLN: 12.0000 [IU] | Freq: Every day | SUBCUTANEOUS | 1 refills | Status: DC

## 2020-04-26 MED ORDER — AMOXICILLIN 500 MG PO CAPS: 500.0000 mg | ORAL_CAPSULE | Freq: Three times a day (TID) | ORAL | 0 refills | Status: DC

## 2020-04-26 MED ORDER — LISINOPRIL 20 MG PO TABS: 20.0000 mg | ORAL_TABLET | Freq: Every day | ORAL | 1 refills | Status: DC

## 2020-04-26 MED ORDER — AMOXICILLIN 250 MG PO CAPS: 500.0000 mg | ORAL_CAPSULE | Freq: Three times a day (TID) | ORAL | Status: DC

## 2020-04-26 MED ORDER — "INSULIN SYRINGE 30G X 1/2"" 1 ML MISC": 1 refills | Status: AC

## 2020-04-26 MED ORDER — MAGNESIUM OXIDE 400 (241.3 MG) MG PO TABS: 400.0000 mg | ORAL_TABLET | Freq: Every day | ORAL | Status: DC

## 2020-04-26 NOTE — Discharge Summary (Addendum)
Physician Discharge Summary  Shannon Herrera DOB: [birth year] DOA: 04/22/2020  PCP: Patient, No Pcp Per  Admit date: 04/22/2020 Discharge date: 04/26/2020  Admitted From: Home Disposition:  Home   Recommendations for Outpatient Follow-up:  1. Follow up with PCP in 1-2 weeks 2. Please obtain BMP/CBC in one week     Discharge Condition: Stable CODE STATUS: FULL Diet recommendation: Heart Healthy / Carb Modified   Brief/Interim Summary: 64 y.o.femalewith medical history significant oftype 2 diabetes, hypertension, hypothyroidism who is brought to the emergency department via EMS due to confusion. Patient's family members called EMS that the patient usually gets confused when she becomes very hyperglycemic. Initial CBG done by EMS was finally599 mg/dL. She is still only oriented only to name and unable to provide further history.She does admit that she is not taking her medications but not able to express the reason. She was fluid resuscitated and started on IV insulin which was transitioned to [place] insulin.  She was noted to have UTI for which she was started on IV ceftriaxone empirically.  Her mental status gradually improved and returned to baseline at the time of d/c.  Discharge Diagnoses:   Hyperosmolar Nonketotic state -patient started on IV insulin with q 1 hour CBG check and q 4 hour BMPs -pt started on aggressive fluid resuscitation -Electrolytes were monitored and repleted -transitioned to [place] insulin once anion gap closed -diet was advanced once anion gap closed -HbA1C--12.1  Uncontrolled Diabetes Mellitus type 2 with hyperglycemia -04/23/20--A1c 12.1 -Continue Lantus -continue novolog sliding scale -holding metformin-->restart after d/c -pt states she takes Lantus at home, not 70/30 -Initially rx  home with Lantus but patient states she cannot afford-->send home with 70/30 18 units bid  Acute Metabolic Encephalopathy -baseline patient is A&O x  3 and able to perform all ADLs -she is more alert but remains confused, not at baseline -due to [facility] and UTI -check B12--330 -TSH 2.076 -check folate--14.8 -d/c tramadol -improved, at baseline at time of d/c  UTI--Ecoli -UA 21-50 WBC -continue ceftriaxone pending culture data -d/c home with amoxil x 3 more days  Essential HTN -restart amlodipine, metoprolol -lisinopril added  Hypothyroidism -continue synthroid -TSH 2.076  Seizure Disorder -continue Keppra  Hypomagnesemia -repleted -d/c home with mag ox 400 mg daily  Thrombocytopenia -chronic -monitor for signs of bleeding -acute worsening due to infection and dilution   Discharge Instructions   Allergies as of 04/26/2020   No Known Allergies     Medication List    TAKE these medications   amLODipine 10 MG tablet Commonly known as: NORVASC Take 10 mg by mouth daily.   amoxicillin 500 MG capsule Commonly known as: AMOXIL Take 1 capsule (500 mg total) by mouth every 8 (eight) hours.   INSULIN SYRINGE 1CC/30GX1/2" 30G X 1/2" 1 ML Misc Use to dispense insulin as directed   levETIRAcetam 750 MG tablet Commonly known as: Keppra Take 1 tablet (750 mg total) by mouth 2 (two) times daily.   levothyroxine 88 MCG tablet Commonly known as: SYNTHROID Take 88 mcg by mouth daily before breakfast.   lisinopril 20 MG tablet Commonly known as: ZESTRIL Take 1 tablet (20 mg total) by mouth daily. Start taking on: April 27, 2020   magnesium oxide 400 (241.3 Mg) MG tablet Commonly known as: MAG-OX Take 1 tablet (400 mg total) by mouth daily.   metFORMIN 1000 MG tablet Commonly known as: GLUCOPHAGE Take 1,000 mg by mouth 2 (two) times daily with a meal.   metoprolol tartrate  50 MG tablet Commonly known as: LOPRESSOR Take 1 tablet (50 mg total) by mouth 2 (two) times daily.   NovoLIN 70/30 (70-30) 100 UNIT/ML injection Generic drug: insulin NPH-regular Human Inject 18 Units into the skin 2 (two) times  daily with a meal. What changed: how much to take       No Known Allergies  Consultations:  none   Procedures/Studies: CT Head Wo Contrast  Result Date: 04/22/2020 CLINICAL DATA:  Altered level of consciousness EXAM: CT HEAD WITHOUT CONTRAST TECHNIQUE: Contiguous axial images were obtained from the base of the skull through the vertex without intravenous contrast. COMPARISON:  04/30/2019 FINDINGS: Brain: No acute infarct or hemorrhage. Lateral ventricles and midline structures are unremarkable. No acute extra-axial fluid collections. No mass effect. Vascular: No hyperdense vessel or unexpected calcification. Skull: Normal. Negative for fracture or focal lesion. Sinuses/Orbits: No acute finding. Other: None. IMPRESSION: 1. No acute intracranial process. Electronically Signed   By: Sharlet Salina M.D.   On: 04/22/2020 22:57   DG Chest Port 1 View  Result Date: 04/22/2020 CLINICAL DATA:  64 year female with altered mental status. EXAM: PORTABLE CHEST 1 VIEW COMPARISON:  Chest radiograph dated 05/01/2019. FINDINGS: The lungs are clear. There is no pleural effusion or pneumothorax. The cardiac silhouette is within limits. Atherosclerotic calcification of the aorta. No acute osseous pathology. IMPRESSION: No active disease. Electronically Signed   By: Elgie Collard M.D.   On: 04/22/2020 22:44        Discharge Exam: Vitals:   04/25/20 2021 04/26/20 0418  BP: 140/76 132/77  Pulse: 64 63  Resp: 16 16  Temp: 98.7 F (37.1 C) 98.5 F (36.9 C)  SpO2: 100% 100%   Vitals:   04/25/20 0530 04/25/20 1415 04/25/20 2021 04/26/20 0418  BP: 128/64 105/61 140/76 132/77  Pulse: 71 76 64 63  Resp: 16 19 16 16   Temp: 97.8 F (36.6 C) 98.2 F (36.8 C) 98.7 F (37.1 C) 98.5 F (36.9 C)  TempSrc: Oral  Oral Oral  SpO2: 100% 100% 100% 100%  Weight:      Height:        General: Pt is alert, awake, not in acute distress Cardiovascular: RRR, S1/S2 +, no rubs, no  gallops Respiratory: CTA bilaterally, no wheezing, no rhonchi Abdominal: Soft, NT, ND, bowel sounds + Extremities: no edema, no cyanosis   The results of significant diagnostics from this hospitalization (including imaging, microbiology, ancillary and laboratory) are listed below for reference.    Significant Diagnostic Studies: CT Head Wo Contrast  Result Date: 04/22/2020 CLINICAL DATA:  Altered level of consciousness EXAM: CT HEAD WITHOUT CONTRAST TECHNIQUE: Contiguous axial images were obtained from the base of the skull through the vertex without intravenous contrast. COMPARISON:  04/30/2019 FINDINGS: Brain: No acute infarct or hemorrhage. Lateral ventricles and midline structures are unremarkable. No acute extra-axial fluid collections. No mass effect. Vascular: No hyperdense vessel or unexpected calcification. Skull: Normal. Negative for fracture or focal lesion. Sinuses/Orbits: No acute finding. Other: None. IMPRESSION: 1. No acute intracranial process. Electronically Signed   By: Sharlet Salina M.D.   On: 04/22/2020 22:57   DG Chest Port 1 View  Result Date: 04/22/2020 CLINICAL DATA:  64 year female with altered mental status. EXAM: PORTABLE CHEST 1 VIEW COMPARISON:  Chest radiograph dated 05/01/2019. FINDINGS: The lungs are clear. There is no pleural effusion or pneumothorax. The cardiac silhouette is within limits. Atherosclerotic calcification of the aorta. No acute osseous pathology. IMPRESSION: No active disease. Electronically Signed  By: Elgie Collard M.D.   On: 04/22/2020 22:44     Microbiology: Recent Results (from the past 240 hour(s))  Urine Culture     Status: Abnormal   Collection Time: 04/22/20  9:14 PM   Specimen: Urine, Random  Result Value Ref Range Status   Specimen Description   Final    URINE, RANDOM Performed at [facility], [address] [place], [place] [address]    Special Requests   Final    NONE Performed at [facility], [address] [place], [place] [address]    Culture >=100,000 COLONIES/mL ESCHERICHIA COLI (A)  Final   Report Status 04/26/2020 FINAL  Final   Organism ID, Bacteria ESCHERICHIA COLI (A)  Final      Susceptibility   Escherichia coli - MIC*    AMPICILLIN 4 SENSITIVE Sensitive     CEFAZOLIN <=4 SENSITIVE Sensitive     CEFTRIAXONE <=0.25 SENSITIVE Sensitive     CIPROFLOXACIN <=0.25 SENSITIVE Sensitive     GENTAMICIN <=1 SENSITIVE Sensitive     IMIPENEM <=0.25 SENSITIVE Sensitive     NITROFURANTOIN <=16 SENSITIVE Sensitive     TRIMETH/SULFA <=20 SENSITIVE Sensitive     AMPICILLIN/SULBACTAM <=2 SENSITIVE Sensitive     PIP/TAZO <=4 SENSITIVE Sensitive     * >=100,000 COLONIES/mL ESCHERICHIA COLI  SARS Coronavirus 2 by RT PCR (hospital order, performed in [facility] hospital lab) Nasopharyngeal Nasopharyngeal Swab     Status: None   Collection Time: 04/22/20 11:55 PM   Specimen: Nasopharyngeal Swab  Result Value Ref Range Status   SARS Coronavirus 2 NEGATIVE NEGATIVE Final    Comment: (NOTE) SARS-CoV-2 target nucleic acids are NOT DETECTED.  The SARS-CoV-2 RNA is generally detectable in upper and lower respiratory specimens during the acute phase of infection. The lowest concentration of SARS-CoV-2 viral copies this assay can detect is 250 copies / mL. A negative result does not preclude SARS-CoV-2 infection and should not be used as the sole basis for treatment or other patient management decisions.  A negative result may occur with improper specimen collection / handling, submission of specimen other than nasopharyngeal swab, presence of viral mutation(s) within the areas targeted by this assay, and inadequate number of viral copies (<250 copies / mL). A negative result must be combined with clinical observations, patient history, and epidemiological information.  Fact Sheet for Patients:   BoilerBrush.com.cy  Fact Sheet for Healthcare  Providers: https://pope.com/  This test is not yet approved or  cleared by the [place] FDA and has been authorized for detection and/or diagnosis of SARS-CoV-2 by FDA under an Emergency Use Authorization (EUA).  This EUA will remain in effect (meaning this test can be used) for the duration of the COVID-19 declaration under Section 564(b)(1) of the Act, 21 U.S.C. section 360bbb-3(b)(1), unless the authorization is terminated or revoked sooner.  Performed at [facility], [address] [place], [place] [address]      Labs: Basic Metabolic Panel: Recent Labs  Lab 04/22/20 2128 04/22/20 2128 04/23/20 0554 04/23/20 0554 04/24/20 0600 04/24/20 0600 04/25/20 0609 04/26/20 0615  NA 132*  --  135  --  133*  --  137 138  K 3.2*   < > 3.7   < > 4.3   < > 4.2 4.1  CL 96*  --  104  --  102  --  108 109  CO2 24  --  21*  --  19*  --  21* 21*  GLUCOSE 604*  --  334*  --  286*  --  182* 151*  BUN 11  --  8  --  7*  --  7* 7*  CREATININE 0.94  --  0.72  --  0.73  --  0.68 0.67  CALCIUM 9.7  --  9.1  --  9.2  --  9.1 8.9  MG 1.9  --   --   --  1.3*  --  1.4* 1.5*  PHOS 3.4  --   --   --   --   --   --   --    < > = values in this interval not displayed.   Liver Function Tests: Recent Labs  Lab 04/22/20 2128  AST 22  ALT 22  ALKPHOS 85  BILITOT 0.6  PROT 7.3  ALBUMIN 4.3   No results for input(s): LIPASE, AMYLASE in the last 168 hours. Recent Labs  Lab 04/24/20 1317  AMMONIA 11   CBC: Recent Labs  Lab 04/22/20 2128 04/23/20 0554 04/25/20 0609  WBC 3.7* 6.2 4.7  HGB 13.2 12.5 12.6  HCT 38.4 36.1 38.5  MCV 91.2 91.4 95.3  PLT 84* 90* 104*   Cardiac Enzymes: No results for input(s): CKTOTAL, CKMB, CKMBINDEX, TROPONINI in the last 168 hours. BNP: Invalid input(s): POCBNP CBG: Recent Labs  Lab 04/25/20 1700 04/25/20 2112 04/26/20 0416 04/26/20 0836 04/26/20 1142  GLUCAP 164* 148* 129* 193* 199*    Time coordinating discharge:   36 minutes  Signed:  Catarina Hartshorn, DO [facility] Pager: [phone] 04/26/2020, 12:49 PM

## 2020-04-26 NOTE — TOC Progression Note (Signed)
Contacted [facility] to check on cost of Lantus for pt at MD request. Informed by [facility] team that pt does not have prescription drug coverage and the Lantus is $304. Spoke with pt who confirms that she does not have Rx insurance and she states that she cannot afford the Lantus. Pt appeared to have some confusion about what insulin she was taking prior to admission. At first she stated that she was already taking Lantus but then seemed unsure and stated it might have been something else. Informed pt that the 70/30 would be $25 a vial and she stated that she can afford this. Encouraged pt to think about enrolling in Medicare Part D plan for drug coverage during the next open enrollment period.  Updated MD who stated he would order the 70/30. Updated RN.  No other TOC needs at this time.

## 2020-04-26 NOTE — Plan of Care (Signed)

## 2020-09-11 ENCOUNTER — Ambulatory Visit: Payer: Medicare Other | Admitting: Dietician

## 2020-09-12 ENCOUNTER — Other Ambulatory Visit: Payer: Self-pay | Admitting: Family Medicine

## 2020-09-12 DIAGNOSIS — Z1231 Encounter for screening mammogram for malignant neoplasm of breast: Secondary | ICD-10-CM

## 2020-10-31 ENCOUNTER — Other Ambulatory Visit: Payer: Self-pay

## 2020-10-31 ENCOUNTER — Ambulatory Visit: Payer: Medicare HMO | Attending: Internal Medicine

## 2020-10-31 DIAGNOSIS — Z23 Encounter for immunization: Secondary | ICD-10-CM

## 2020-10-31 NOTE — Progress Notes (Signed)
Covid-19 Vaccination Clinic  Name:  [name]    MRN: [id number] DOB: 1956-05-01  10/31/2020  Ms. Grenier was observed post Covid-19 immunization for 15 minutes without incident. She was provided with Vaccine Information Sheet and instruction to access the V-Safe system.   Ms. Clopton was instructed to call 911 with any severe reactions post vaccine: Marland Kitchen Difficulty breathing  . Swelling of face and throat  . A fast heartbeat  . A bad rash all over body  . Dizziness and weakness   Immunizations Administered    Name Date Dose VIS Date Route   Moderna Covid-19 Booster Vaccine 10/31/2020 11:50 PM 0.25 mL 07/31/2020 Intramuscular   Manufacturer: Gala Murdoch   Lot: [id number]   NDC: 14782-956-21

## 2020-11-22 ENCOUNTER — Emergency Department
Admission: EM | Admit: 2020-11-22 | Discharge: 2020-11-23 | Disposition: A | Discharge status: Home / Self Care | Payer: Medicare HMO | Attending: Emergency Medicine | Admitting: Emergency Medicine

## 2020-11-22 ENCOUNTER — Emergency Department: Payer: Medicare HMO

## 2020-11-22 DIAGNOSIS — G40901 Epilepsy, unspecified, not intractable, with status epilepticus: Secondary | ICD-10-CM

## 2020-11-22 DIAGNOSIS — J9690 Respiratory failure, unspecified, unspecified whether with hypoxia or hypercapnia: Secondary | ICD-10-CM | POA: Insufficient documentation

## 2020-11-22 DIAGNOSIS — Z20822 Contact with and (suspected) exposure to covid-19: Secondary | ICD-10-CM | POA: Diagnosis not present

## 2020-11-22 DIAGNOSIS — R569 Unspecified convulsions: Secondary | ICD-10-CM | POA: Diagnosis not present

## 2020-11-22 DIAGNOSIS — R404 Transient alteration of awareness: Secondary | ICD-10-CM | POA: Diagnosis not present

## 2020-11-22 DIAGNOSIS — E1165 Type 2 diabetes mellitus with hyperglycemia: Secondary | ICD-10-CM | POA: Diagnosis not present

## 2020-11-22 DIAGNOSIS — R0902 Hypoxemia: Secondary | ICD-10-CM | POA: Diagnosis not present

## 2020-11-22 DIAGNOSIS — R4020 Unspecified coma: Secondary | ICD-10-CM | POA: Diagnosis not present

## 2020-11-22 DIAGNOSIS — Z452 Encounter for adjustment and management of vascular access device: Secondary | ICD-10-CM | POA: Diagnosis not present

## 2020-11-22 DIAGNOSIS — Z4682 Encounter for fitting and adjustment of non-vascular catheter: Secondary | ICD-10-CM | POA: Diagnosis not present

## 2020-11-22 DIAGNOSIS — J984 Other disorders of lung: Secondary | ICD-10-CM | POA: Diagnosis not present

## 2020-11-22 DIAGNOSIS — R000 Tachycardia, unspecified: Secondary | ICD-10-CM | POA: Diagnosis not present

## 2020-11-22 DIAGNOSIS — R918 Other nonspecific abnormal finding of lung field: Secondary | ICD-10-CM | POA: Diagnosis not present

## 2020-11-22 HISTORY — DX: Essential (primary) hypertension: I10

## 2020-11-22 HISTORY — DX: Unspecified convulsions: R56.9

## 2020-11-22 HISTORY — DX: Type 2 diabetes mellitus without complications: E11.9

## 2020-11-22 HISTORY — DX: Alcohol dependence, uncomplicated: F10.20

## 2020-11-22 LAB — BLOOD GAS, ARTERIAL
Acid-base deficit: 0.7 mmol/L (ref 0.0–2.0)
Bicarbonate: 24.2 mmol/L (ref 20.0–28.0)
FIO2: 0.3
MECHVT: 450 mL
Mechanical Rate: 18
O2 Saturation: 98.9 %
Patient temperature: 37
pCO2 arterial: 40 mmHg (ref 32.0–48.0)
pH, Arterial: 7.39 (ref 7.350–7.450)
pO2, Arterial: 127 mmHg — ABNORMAL HIGH (ref 83.0–108.0)

## 2020-11-22 LAB — URINALYSIS, COMPLETE (UACMP) WITH MICROSCOPIC
Bacteria, UA: NONE SEEN
Bilirubin Urine: NEGATIVE
Glucose, UA: >=500 mg/dL — AB
Ketones, ur: NEGATIVE mg/dL
Leukocytes,Ua: NEGATIVE
Nitrite: POSITIVE — AB
Protein, ur: 100 mg/dL — AB
Specific Gravity, Urine: 1.018 (ref 1.005–1.030)
Squamous Epithelial / HPF: NONE SEEN (ref 0–5)
pH: 6 (ref 5.0–8.0)

## 2020-11-22 LAB — TROPONIN I (HIGH SENSITIVITY)
Troponin I (High Sensitivity): 7 ng/L (ref <18)
Troponin I (High Sensitivity): 21 ng/L — ABNORMAL HIGH (ref <18)

## 2020-11-22 LAB — COMPREHENSIVE METABOLIC PANEL
ALT: 29 U/L (ref 0–44)
AST: 35 U/L (ref 15–41)
Albumin: 4.7 g/dL (ref 3.5–5.0)
Alkaline Phosphatase: 84 U/L (ref 38–126)
Anion gap: 14 (ref 5–15)
BUN: 20 mg/dL (ref 8–23)
CO2: 21 mmol/L — ABNORMAL LOW (ref 22–32)
Calcium: 9.9 mg/dL (ref 8.9–10.3)
Chloride: 97 mmol/L — ABNORMAL LOW (ref 98–111)
Creatinine, Ser: 1 mg/dL (ref 0.44–1.00)
GFR, Estimated: 51 mL/min — ABNORMAL LOW (ref >60)
Glucose, Bld: 650 mg/dL (ref 70–99)
Potassium: 3.7 mmol/L (ref 3.5–5.1)
Sodium: 132 mmol/L — ABNORMAL LOW (ref 135–145)
Total Bilirubin: 0.8 mg/dL (ref 0.3–1.2)
Total Protein: 8.5 g/dL — ABNORMAL HIGH (ref 6.5–8.1)

## 2020-11-22 LAB — CBC WITH DIFFERENTIAL/PLATELET
Abs Immature Granulocytes: 0.01 10*3/uL (ref 0.00–0.07)
Basophils Absolute: 0 10*3/uL (ref 0.0–0.1)
Basophils Relative: 1 %
Eosinophils Absolute: 0.1 10*3/uL (ref 0.0–0.5)
Eosinophils Relative: 4 %
HCT: 40.9 % (ref 36.0–46.0)
Hemoglobin: 14.3 g/dL (ref 12.0–15.0)
Immature Granulocytes: 0 %
Lymphocytes Relative: 36 %
Lymphs Abs: 1.3 10*3/uL (ref 0.7–4.0)
MCH: 32.1 pg (ref 26.0–34.0)
MCHC: 35 g/dL (ref 30.0–36.0)
MCV: 91.9 fL (ref 80.0–100.0)
Monocytes Absolute: 0.2 10*3/uL (ref 0.1–1.0)
Monocytes Relative: 7 %
Neutro Abs: 1.9 10*3/uL (ref 1.7–7.7)
Neutrophils Relative %: 52 %
Platelets: 70 10*3/uL — ABNORMAL LOW (ref 150–400)
RBC: 4.45 MIL/uL (ref 3.87–5.11)
RDW: 13.5 % (ref 11.5–15.5)
WBC: 3.6 10*3/uL — ABNORMAL LOW (ref 4.0–10.5)
nRBC: 0 % (ref 0.0–0.2)

## 2020-11-22 LAB — ETHANOL: Alcohol, Ethyl (B): <10 mg/dL (ref <10)

## 2020-11-22 LAB — CBG MONITORING, ED
Glucose-Capillary: 246 mg/dL — ABNORMAL HIGH (ref 70–99)
Glucose-Capillary: 399 mg/dL — ABNORMAL HIGH (ref 70–99)
Glucose-Capillary: >600 mg/dL (ref 70–99)

## 2020-11-22 LAB — LIPASE, BLOOD: Lipase: 167 U/L — ABNORMAL HIGH (ref 11–51)

## 2020-11-22 LAB — URINE DRUG SCREEN, QUALITATIVE (ARMC ONLY)
Amphetamines, Ur Screen: NOT DETECTED
Barbiturates, Ur Screen: NOT DETECTED
Benzodiazepine, Ur Scrn: POSITIVE — AB
Cannabinoid 50 Ng, Ur ~~LOC~~: NOT DETECTED
Cocaine Metabolite,Ur ~~LOC~~: NOT DETECTED
MDMA (Ecstasy)Ur Screen: NOT DETECTED
Methadone Scn, Ur: NOT DETECTED
Opiate, Ur Screen: NOT DETECTED
Phencyclidine (PCP) Ur S: NOT DETECTED
Tricyclic, Ur Screen: NOT DETECTED

## 2020-11-22 LAB — OSMOLALITY: Osmolality: 308 mOsm/kg — ABNORMAL HIGH (ref 275–295)

## 2020-11-22 LAB — RESP PANEL BY RT-PCR (FLU A&B, COVID) ARPGX2
Influenza A by PCR: NEGATIVE
Influenza B by PCR: NEGATIVE
SARS Coronavirus 2 by RT PCR: NEGATIVE

## 2020-11-22 LAB — BETA-HYDROXYBUTYRIC ACID: Beta-Hydroxybutyric Acid: 0.14 mmol/L (ref 0.05–0.27)

## 2020-11-22 MED ORDER — LACTATED RINGERS IV SOLN: INTRAVENOUS | Status: DC

## 2020-11-22 MED ORDER — LACTATED RINGERS IV BOLUS
20.0000 mL/kg | Freq: Once | INTRAVENOUS | Status: AC
  Administered 2020-11-22: 1200 mL via INTRAVENOUS

## 2020-11-22 MED ORDER — NICARDIPINE HCL IN NACL 20-0.86 MG/200ML-% IV SOLN
3.0000 mg/h | INTRAVENOUS | Status: DC
  Administered 2020-11-22: 5 mg/h via INTRAVENOUS
  Filled 2020-11-22: qty 200

## 2020-11-22 MED ORDER — SODIUM CHLORIDE 0.9 % IV BOLUS
1000.0000 mL | Freq: Once | INTRAVENOUS | Status: AC
  Administered 2020-11-22: 1000 mL via INTRAVENOUS

## 2020-11-22 MED ORDER — LEVETIRACETAM IN NACL 1500 MG/100ML IV SOLN
1500.0000 mg | INTRAVENOUS | Status: AC
  Administered 2020-11-22: 1500 mg via INTRAVENOUS
  Filled 2020-11-22: qty 100

## 2020-11-22 MED ORDER — MIDAZOLAM 50MG/50ML (1MG/ML) PREMIX INFUSION
0.0000 mg/h | INTRAVENOUS | Status: DC
  Administered 2020-11-22: 2 mg/h via INTRAVENOUS
  Administered 2020-11-23: 7 mg/h via INTRAVENOUS
  Filled 2020-11-22: qty 50

## 2020-11-22 MED ORDER — FENTANYL CITRATE (PF) 100 MCG/2ML IJ SOLN
50.0000 ug | Freq: Once | INTRAMUSCULAR | Status: AC
  Administered 2020-11-22: 50 ug via INTRAVENOUS

## 2020-11-22 MED ORDER — DEXTROSE 50 % IV SOLN: 0.0000 mL | INTRAVENOUS | Status: DC | PRN

## 2020-11-22 MED ORDER — SODIUM CHLORIDE 0.9 % IV SOLN: 2500.0000 mg | Freq: Once | INTRAVENOUS | Status: DC

## 2020-11-22 MED ORDER — LEVETIRACETAM IN NACL 1000 MG/100ML IV SOLN
1000.0000 mg | Freq: Once | INTRAVENOUS | Status: AC
  Administered 2020-11-22: 1000 mg via INTRAVENOUS
  Filled 2020-11-22: qty 100

## 2020-11-22 MED ORDER — MIDAZOLAM HCL 5 MG/5ML IJ SOLN
15.0000 mg | Freq: Once | INTRAMUSCULAR | Status: AC
  Administered 2020-11-22: 15 mg via INTRAVENOUS

## 2020-11-22 MED ORDER — MIDAZOLAM BOLUS VIA INFUSION
1.0000 mg | INTRAVENOUS | Status: DC | PRN
  Filled 2020-11-22: qty 2

## 2020-11-22 MED ORDER — INSULIN REGULAR(HUMAN) IN NACL 100-0.9 UT/100ML-% IV SOLN
INTRAVENOUS | Status: DC
  Administered 2020-11-22: 10 [IU]/h via INTRAVENOUS
  Filled 2020-11-22: qty 100

## 2020-11-22 MED ORDER — PROPOFOL 1000 MG/100ML IV EMUL
0.0000 ug/kg/min | INTRAVENOUS | Status: DC
  Administered 2020-11-22: 5 ug/kg/min via INTRAVENOUS
  Filled 2020-11-22 (×2): qty 100

## 2020-11-22 MED ORDER — FENTANYL BOLUS VIA INFUSION
25.0000 ug | INTRAVENOUS | Status: DC | PRN
  Filled 2020-11-22: qty 25

## 2020-11-22 MED ORDER — FENTANYL 2500MCG IN NS 250ML (10MCG/ML) PREMIX INFUSION
25.0000 ug/h | INTRAVENOUS | Status: DC
  Administered 2020-11-22: 50 ug/h via INTRAVENOUS

## 2020-11-22 MED ORDER — ROCURONIUM BROMIDE 50 MG/5ML IV SOLN
INTRAVENOUS | Status: AC | PRN
  Administered 2020-11-22: 60 mg via INTRAVENOUS

## 2020-11-22 MED ORDER — SODIUM CHLORIDE 0.9 % IV SOLN
1.0000 g | Freq: Once | INTRAVENOUS | Status: AC
  Administered 2020-11-22: 1 g via INTRAVENOUS
  Filled 2020-11-22: qty 10

## 2020-11-22 MED ORDER — POTASSIUM CHLORIDE 10 MEQ/100ML IV SOLN
10.0000 meq | INTRAVENOUS | Status: AC
  Administered 2020-11-22 (×2): 10 meq via INTRAVENOUS
  Filled 2020-11-22 (×2): qty 100

## 2020-11-22 MED ORDER — DEXTROSE IN LACTATED RINGERS 5 % IV SOLN: INTRAVENOUS | Status: DC

## 2020-11-22 NOTE — ED Notes (Signed)
Pt placed on bed alarm upon return to ED, pt has yellow socks and falls risk armband on, bed in lowest and locked position.

## 2020-11-22 NOTE — ED Notes (Addendum)
Pt experiencing seizure-like behavior witnessed by Erie Noe RN, seizure-like activity done by the time this RN and Bradler MD arriving to bedside. Bradler MD advising need for intubation due to patient not returning to baseline mental status after initial seizure. Respiratory called, intubation supplies at bedside.

## 2020-11-22 NOTE — ED Notes (Signed)
Patient transported to CT accompanied by this RN. 

## 2020-11-22 NOTE — ED Notes (Addendum)
Date and time results received: 11/22/20 8:37 PM   Test: Glucose Critical Value: 650  Name of Provider Notified: Vicente Males MD

## 2020-11-22 NOTE — ED Notes (Signed)
Arnell to [employer] to [employer].

## 2020-11-22 NOTE — ED Provider Notes (Signed)
[facility] Emergency Department Provider Note   ____________________________________________   Event Date/Time   First MD Initiated Contact with Patient 11/22/20 1935     (approximate)  I have reviewed the triage vital signs and the nursing notes.   HISTORY  Chief Complaint Seizures and Hyperglycemia    HPI Shannon Herrera is a 65 y.o. female  Patient is a Shannon Herrera approximately middle-aged female who presents via EMS after at least 2 episodes of seizure-like activity.  EMS witnessed 1 episode of seizure-like activity with bladder incontinence.  Unknown history of seizures.  Patient's blood glucose was found to be 597.  Patient was given 2 mg of Versed prior to arrival with cessation of seizure activity.  Patient arrives minimally responsive in a stretcher with nasal cannula in place.            No past medical history on file.  There are no problems to display for this patient.     Prior to Admission medications   Not on File    Allergies Patient has no allergy information on record.  No family history on file.  Social History    Review of Systems Unable to assess ____________________________________________   PHYSICAL EXAM:  VITAL SIGNS: ED Triage Vitals [11/22/20 1934]  Enc Vitals Group     BP (!) 169/97     Pulse Rate (!) 124     Resp (!) 22     Temp (!) 97.1 F (36.2 C)     Temp Source Oral     SpO2 94 %     Weight      Height      Head Circumference      Peak Flow      Pain Score      Pain Loc      Pain Edu?      Excl. in GC?     Constitutional: Minimally responsive on stretcher with nasal cannula in place. Eyes: Conjunctivae are normal. PERRL.  Gaze deviated upward to the left Head: Atraumatic. Nose: No congestion/rhinnorhea. Mouth/Throat: Mucous membranes are moist. Neck: No stridor Cardiovascular: Grossly normal heart sounds.  Good peripheral circulation. Respiratory: Normal respiratory effort.  No  retractions. Gastrointestinal: Soft and nontender. No distention. Genitourinary: Normal external female genitalia without lesions or erythema Musculoskeletal: No obvious deformities Neurologic: GCS 9.  Moving all extremities spontaneously. Skin:  Skin is warm and dry. No rash noted.  ____________________________________________   LABS (all labs ordered are listed, but only abnormal results are displayed)  Labs Reviewed  COMPREHENSIVE METABOLIC PANEL - Abnormal; Notable for the following components:      Result Value   Sodium 132 (*)    Chloride 97 (*)    CO2 21 (*)    Glucose, Bld 650 (*)    Total Protein 8.5 (*)    GFR, Estimated 51 (*)    All other components within normal limits  LIPASE, BLOOD - Abnormal; Notable for the following components:   Lipase 167 (*)    All other components within normal limits  CBC WITH DIFFERENTIAL/PLATELET - Abnormal; Notable for the following components:   WBC 3.6 (*)    Platelets 70 (*)    All other components within normal limits  BLOOD GAS, VENOUS - Abnormal; Notable for the following components:   pO2, Ven 53.0 (*)    Acid-base deficit 4.4 (*)    All other components within normal limits  URINALYSIS, COMPLETE (UACMP) WITH MICROSCOPIC - Abnormal; Notable for  the following components:   Color, Urine STRAW (*)    APPearance CLEAR (*)    Glucose, UA >=500 (*)    Hgb urine dipstick SMALL (*)    Protein, ur 100 (*)    Nitrite POSITIVE (*)    All other components within normal limits  URINE DRUG SCREEN, QUALITATIVE ([facility] ONLY) - Abnormal; Notable for the following components:   Benzodiazepine, Ur Scrn POSITIVE (*)    All other components within normal limits  OSMOLALITY - Abnormal; Notable for the following components:   Osmolality 308 (*)    All other components within normal limits  BLOOD GAS, ARTERIAL - Abnormal; Notable for the following components:   pO2, Arterial 127 (*)    All other components within normal limits  CBG MONITORING,  ED - Abnormal; Notable for the following components:   Glucose-Capillary >600 (*)    All other components within normal limits  CBG MONITORING, ED - Abnormal; Notable for the following components:   Glucose-Capillary 399 (*)    All other components within normal limits  CBG MONITORING, ED - Abnormal; Notable for the following components:   Glucose-Capillary 246 (*)    All other components within normal limits  TROPONIN I (HIGH SENSITIVITY) - Abnormal; Notable for the following components:   Troponin I (High Sensitivity) 21 (*)    All other components within normal limits  RESP PANEL BY RT-PCR (FLU A&B, COVID) ARPGX2  CULTURE, BLOOD (SINGLE)  URINE CULTURE  ETHANOL  BETA-HYDROXYBUTYRIC ACID  URINALYSIS, ROUTINE W REFLEX MICROSCOPIC  TRIGLYCERIDES  TROPONIN I (HIGH SENSITIVITY)   ____________________________________________  EKG  ED ECG REPORT I, Merwyn Katos, the attending physician, personally viewed and interpreted this ECG.  Date: 11/22/2020 EKG Time: 1931 Rate: 121 Rhythm: Tachycardic sinus rhythm QRS Axis: normal Intervals: normal ST/T Wave abnormalities: normal Narrative Interpretation: no evidence of acute ischemia  ____________________________________________  RADIOLOGY  ED MD interpretation: CT of the head without contrast shows no evidence of acute abnormalities including no intracranial hemorrhage, significant edema, or obvious masses  Official radiology report(s): CT Head Wo Contrast  Result Date: 11/22/2020 CLINICAL DATA:  Found down with witnessed seizure. EXAM: CT HEAD WITHOUT CONTRAST TECHNIQUE: Contiguous axial images were obtained from the base of the skull through the vertex without intravenous contrast. COMPARISON:  None. FINDINGS: Brain: There is mild cerebral atrophy with widening of the extra-axial spaces and ventricular dilatation. There are areas of decreased attenuation within the white matter tracts of the supratentorial brain, consistent with  microvascular disease changes. Vascular: No hyperdense vessel or unexpected calcification. Skull: Normal. Negative for fracture or focal lesion. Sinuses/Orbits: No acute finding. Other: None. IMPRESSION: No acute intracranial pathology. Electronically Signed   By: Aram Candela M.D.   On: 11/22/2020 20:08    ____________________________________________   PROCEDURES  Procedure(s) performed (including Critical Care):  .Critical Care Performed by: Merwyn Katos, MD Authorized by: Merwyn Katos, MD   Critical care provider statement:    Critical care time (minutes):  73   Critical care time was exclusive of:  Separately billable procedures and treating other patients   Critical care was necessary to treat or prevent imminent or life-threatening deterioration of the following conditions:  CNS failure or compromise and respiratory failure   Critical care was time spent personally by me on the following activities:  Discussions with consultants, evaluation of patient's response to treatment, examination of patient, ordering and performing treatments and interventions, ordering and review of laboratory studies, ordering and review of  radiographic studies, pulse oximetry, re-evaluation of patient's condition, obtaining history from patient or surrogate and review of old charts   I assumed direction of critical care for this patient from another provider in my specialty: no     Care discussed with: admitting provider   .1-3 Lead EKG Interpretation Performed by: Merwyn Katos, MD Authorized by: Merwyn Katos, MD     Interpretation: abnormal     ECG rate:  111   ECG rate assessment: tachycardic     Rhythm: sinus tachycardia     Ectopy: none     Conduction: normal   .Central Line  Date/Time: 11/22/2020 11:58 PM Performed by: Merwyn Katos, MD Authorized by: Merwyn Katos, MD   Consent:    Consent obtained:  Emergent situation   Risks, benefits, and alternatives were  discussed: not applicable   Universal protocol:    Patient identity confirmed:  Arm band Pre-procedure details:    Indication(s): central venous access and insufficient peripheral access     Hand hygiene: Hand hygiene performed prior to insertion     Sterile barrier technique: All elements of maximal sterile technique followed     Skin preparation:  Chlorhexidine   Skin preparation agent: Skin preparation agent completely dried prior to procedure   Sedation:    Sedation type:  Deep Anesthesia:    Anesthesia method:  Local infiltration   Local anesthetic:  Lidocaine 1% w/o epi Procedure details:    Location:  R internal jugular   Patient position:  Supine   Procedural supplies:  Triple lumen   Catheter size:  8.5 Fr   Landmarks identified: yes     Ultrasound guidance: yes     Ultrasound guidance timing: real time     Sterile ultrasound techniques: Sterile gel and sterile probe covers were used     Number of attempts:  1   Successful placement: yes   Post-procedure details:    Post-procedure:  Dressing applied and line sutured   Assessment:  Blood return through all ports, placement verified by x-ray and no pneumothorax on x-ray   Procedure completion:  Tolerated well, no immediate complications Procedure Name: Intubation Date/Time: 11/22/2020 11:59 PM Performed by: Merwyn Katos, MD Pre-anesthesia Checklist: Patient identified, Emergency Drugs available, Suction available, Patient being monitored and Timeout performed Oxygen Delivery Method: Ambu bag Preoxygenation: Pre-oxygenation with 100% oxygen Induction Type: Rapid sequence and IV induction Ventilation: Mask ventilation without difficulty Laryngoscope Size: 3 and Glidescope Tube size: 7.5 mm Number of attempts: 1 Airway Equipment and Method: Video-laryngoscopy Placement Confirmation: ETT inserted through vocal cords under direct vision,  Positive ETCO2,  CO2 detector and Breath sounds checked- equal and  bilateral Secured at: 23 cm Tube secured with: ETT holder Dental Injury: Teeth and Oropharynx as per pre-operative assessment         ____________________________________________   INITIAL IMPRESSION / ASSESSMENT AND PLAN / ED COURSE  As part of my medical decision making, I reviewed the following data within the electronic MEDICAL RECORD NUMBER Nursing notes reviewed and incorporated, Labs reviewed, EKG interpreted, Old chart reviewed, Radiograph reviewed and Notes from prior ED visits reviewed and incorporated        Patient is a 65 year old female who presents via EMS for this seizure activity.  Patient arrives with GCS of 9.  Patient was given a total of 4 mg of Versed as well as a gram of Keppra prior to having another seizure.  Patient was then intubated for airway protection and  continue to infuse propofol, Versed, and Keppra at 60 mg/kg.  Patient also started on empiric insulin drip for likely HHS given patient's blood glucose greater than 650.  No evidence of DKA as patient's anion gap and pH normal.  Patient also shows signs of urinary tract infection with nitrites in her urine and will be covered empirically with Rocephin as well as urine culture.  Differential diagnosis: DKA, HHS, status epilepticus, intracranial mass, intracranial hemorrhage Consult: Spoke with on-call neuro intensivist at [facility] who agreed to accept this patient in transfer Dispo: Transfer to [facility]      ____________________________________________   FINAL CLINICAL IMPRESSION(S) / ED DIAGNOSES  Final diagnoses:  Status epilepticus [facility])     ED Discharge Orders    None       Note:  This document was prepared using Dragon voice recognition software and may include unintentional dictation errors.   Merwyn Katos, MD 11/23/20 0000

## 2020-11-22 NOTE — Code Documentation (Signed)
Respiratory at bedside.

## 2020-11-22 NOTE — ED Notes (Signed)
Bradler MD at bedside to place central line.

## 2020-11-22 NOTE — ED Triage Notes (Signed)
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 FSBS was 597 with EMS and pt was found to be tachycardic at 120s and hypertensive at 202/120. Pt had witnessed seizure en route with EMS, given 2mg  of versed and seizure stopped. Pt responsive to pain at this time.

## 2020-11-22 NOTE — ED Notes (Addendum)
This RN sent secure message to Bradler MD about possible central line. Pt has 4 PIVs and April RN has attempted IJ and other staff have attempted multiple times for more PIVs without success. No line open for Keppra ordered.

## 2020-11-23 DIAGNOSIS — F1010 Alcohol abuse, uncomplicated: Secondary | ICD-10-CM | POA: Diagnosis not present

## 2020-11-23 DIAGNOSIS — N390 Urinary tract infection, site not specified: Secondary | ICD-10-CM | POA: Diagnosis not present

## 2020-11-23 DIAGNOSIS — D696 Thrombocytopenia, unspecified: Secondary | ICD-10-CM | POA: Diagnosis not present

## 2020-11-23 DIAGNOSIS — E1100 Type 2 diabetes mellitus with hyperosmolarity without nonketotic hyperglycemic-hyperosmolar coma (NKHHC): Secondary | ICD-10-CM | POA: Diagnosis not present

## 2020-11-23 DIAGNOSIS — R918 Other nonspecific abnormal finding of lung field: Secondary | ICD-10-CM | POA: Diagnosis not present

## 2020-11-23 DIAGNOSIS — Z4682 Encounter for fitting and adjustment of non-vascular catheter: Secondary | ICD-10-CM | POA: Diagnosis not present

## 2020-11-23 DIAGNOSIS — F17210 Nicotine dependence, cigarettes, uncomplicated: Secondary | ICD-10-CM | POA: Diagnosis not present

## 2020-11-23 DIAGNOSIS — Z20822 Contact with and (suspected) exposure to covid-19: Secondary | ICD-10-CM | POA: Diagnosis not present

## 2020-11-23 DIAGNOSIS — E785 Hyperlipidemia, unspecified: Secondary | ICD-10-CM | POA: Diagnosis not present

## 2020-11-23 DIAGNOSIS — R569 Unspecified convulsions: Secondary | ICD-10-CM | POA: Diagnosis present

## 2020-11-23 DIAGNOSIS — J984 Other disorders of lung: Secondary | ICD-10-CM | POA: Diagnosis not present

## 2020-11-23 DIAGNOSIS — F10239 Alcohol dependence with withdrawal, unspecified: Secondary | ICD-10-CM | POA: Diagnosis not present

## 2020-11-23 DIAGNOSIS — E039 Hypothyroidism, unspecified: Secondary | ICD-10-CM | POA: Diagnosis not present

## 2020-11-23 DIAGNOSIS — Z452 Encounter for adjustment and management of vascular access device: Secondary | ICD-10-CM | POA: Diagnosis not present

## 2020-11-23 DIAGNOSIS — G40909 Epilepsy, unspecified, not intractable, without status epilepticus: Secondary | ICD-10-CM | POA: Diagnosis not present

## 2020-11-23 DIAGNOSIS — I10 Essential (primary) hypertension: Secondary | ICD-10-CM | POA: Diagnosis not present

## 2020-11-23 DIAGNOSIS — J9690 Respiratory failure, unspecified, unspecified whether with hypoxia or hypercapnia: Secondary | ICD-10-CM | POA: Diagnosis not present

## 2020-11-23 DIAGNOSIS — E1165 Type 2 diabetes mellitus with hyperglycemia: Secondary | ICD-10-CM | POA: Diagnosis not present

## 2020-11-23 DIAGNOSIS — J9600 Acute respiratory failure, unspecified whether with hypoxia or hypercapnia: Secondary | ICD-10-CM | POA: Diagnosis not present

## 2020-11-23 DIAGNOSIS — G40901 Epilepsy, unspecified, not intractable, with status epilepticus: Secondary | ICD-10-CM | POA: Diagnosis not present

## 2020-11-23 LAB — CBG MONITORING, ED: Glucose-Capillary: 215 mg/dL — ABNORMAL HIGH (ref 70–99)

## 2020-11-23 NOTE — ED Notes (Addendum)
This RN spoke with husband and made him aware of transfer to [facility], husband, Georgeanna Radziewicz, consents over phone to transfer.

## 2020-11-23 NOTE — ED Notes (Signed)
Report given to [facility] staff, advising ETA is 15 minutes.

## 2020-11-23 NOTE — ED Notes (Signed)
This RN attempted to call husband at provided phone number from registration, no answer.

## 2020-11-23 NOTE — ED Notes (Signed)
EMTALA CHECKED FOR COMPLETION

## 2020-11-23 NOTE — ED Notes (Signed)
NO CONSENT FOR TRANSFER OBTAINED, UNABLE TO REACH PT'S SPOUSE WADE ON TELEPHONE AFTER MULTIPLE ATTEMPTS TO GET CONSENT. PT IS CRITICALLY ILL AND ON A VENTILATOR SEDATED.

## 2020-11-23 NOTE — ED Notes (Signed)
This RN attempting to contact husband at [phone], no answer. Phone number given to receiving RN at [facility] to continue attempting to contact husband.

## 2020-11-25 ENCOUNTER — Encounter (HOSPITAL_COMMUNITY): Payer: Self-pay | Admitting: Internal Medicine

## 2020-11-25 LAB — BLOOD GAS, VENOUS
Acid-base deficit: 4.4 mmol/L — ABNORMAL HIGH (ref 0.0–2.0)
Bicarbonate: 23 mmol/L (ref 20.0–28.0)
O2 Saturation: 81.7 %
Patient temperature: 37
pCO2, Ven: 50 mmHg (ref 44.0–60.0)
pH, Ven: 7.27 (ref 7.250–7.430)
pO2, Ven: 53 mmHg — ABNORMAL HIGH (ref 32.0–45.0)

## 2020-11-27 LAB — CULTURE, BLOOD (SINGLE): Culture: NO GROWTH

## 2021-01-30 ENCOUNTER — Other Ambulatory Visit: Payer: Self-pay

## 2021-01-30 ENCOUNTER — Encounter (HOSPITAL_COMMUNITY): Payer: Self-pay

## 2021-01-30 ENCOUNTER — Emergency Department (HOSPITAL_COMMUNITY)
Admission: EM | Admit: 2021-01-30 | Discharge: 2021-01-30 | Disposition: A | Discharge status: Home / Self Care | Payer: Medicare HMO | Attending: Emergency Medicine | Admitting: Emergency Medicine

## 2021-01-30 DIAGNOSIS — K047 Periapical abscess without sinus: Secondary | ICD-10-CM | POA: Insufficient documentation

## 2021-01-30 DIAGNOSIS — I10 Essential (primary) hypertension: Secondary | ICD-10-CM | POA: Diagnosis not present

## 2021-01-30 DIAGNOSIS — K029 Dental caries, unspecified: Secondary | ICD-10-CM | POA: Insufficient documentation

## 2021-01-30 DIAGNOSIS — Z79899 Other long term (current) drug therapy: Secondary | ICD-10-CM | POA: Diagnosis not present

## 2021-01-30 DIAGNOSIS — R600 Localized edema: Secondary | ICD-10-CM | POA: Insufficient documentation

## 2021-01-30 DIAGNOSIS — E039 Hypothyroidism, unspecified: Secondary | ICD-10-CM | POA: Insufficient documentation

## 2021-01-30 DIAGNOSIS — K0889 Other specified disorders of teeth and supporting structures: Secondary | ICD-10-CM | POA: Diagnosis present

## 2021-01-30 DIAGNOSIS — Z7984 Long term (current) use of oral hypoglycemic drugs: Secondary | ICD-10-CM | POA: Diagnosis not present

## 2021-01-30 DIAGNOSIS — R220 Localized swelling, mass and lump, head: Secondary | ICD-10-CM | POA: Diagnosis not present

## 2021-01-30 DIAGNOSIS — R000 Tachycardia, unspecified: Secondary | ICD-10-CM | POA: Diagnosis not present

## 2021-01-30 DIAGNOSIS — E1110 Type 2 diabetes mellitus with ketoacidosis without coma: Secondary | ICD-10-CM | POA: Diagnosis not present

## 2021-01-30 DIAGNOSIS — Z794 Long term (current) use of insulin: Secondary | ICD-10-CM | POA: Diagnosis not present

## 2021-01-30 DIAGNOSIS — F17210 Nicotine dependence, cigarettes, uncomplicated: Secondary | ICD-10-CM | POA: Diagnosis not present

## 2021-01-30 MED ORDER — LIDOCAINE VISCOUS HCL 2 % MT SOLN: 15.0000 mL | OROMUCOSAL | 0 refills | Status: DC | PRN

## 2021-01-30 MED ORDER — CLINDAMYCIN HCL 150 MG PO CAPS: 300.0000 mg | ORAL_CAPSULE | Freq: Three times a day (TID) | ORAL | 0 refills | Status: AC

## 2021-01-30 NOTE — Discharge Instructions (Addendum)
Take antibiotics as prescribed.  Take entire course, even if symptoms improve. Continue taking ibuprofen 3 times a day with meals.  Do not take other anti-inflammatories at the same time (Advil, Motrin, naproxen, Aleve). You may supplement with Tylenol if you need further pain control. Use ice packs or heating pads if this helps control your pain. Use viscous lidocaine to help with pain control.  Follow up with your dentist for further management of your tooth.  Return to the ER if you develop fevers, severe worsening pain, inability to open your mouth, pain in your eye, swelling of the tongue or lower lips, difficulty breathing, or any new, worsening, or concerning symptoms.

## 2021-01-30 NOTE — ED Provider Notes (Signed)
[facility] EMERGENCY DEPARTMENT Provider Note   CSN: [id number] Arrival date & time: 01/30/21  0848     History Chief Complaint  Patient presents with  . Facial Swelling    Shannon Herrera is a 65 y.o. female presenting for evaluation of upper lip swelling and pain.   Pt states for the past 2 days she has had gradually worsening pain and swelling of her upper lip and upper tooth. She has a h/o cracked upper tooth, used to follow with the dental clinic at [facility], but has not been for the past 2 yrs due to covid. She states her tooth started hurting same time as she developed some swelling. Swelling and pain worsened today, prompting her ER visit. No fevers, chills, nasal congestion, ear pain, ST. No difficulty breathing. No new medicines. She is on metoprolol, and has not had it this AM. She is on lisinopril, this is not a new medication. She has taken ibuprofen without improvement of sxs. Has not tried anything else. Pain is constant, does not radiate.    HPI     Past Medical History:  Diagnosis Date  . Alcoholism (HCC)   . Diabetes mellitus without complication (HCC)   . Hypertension   . Hypothyroid   . Seizures (HCC)   . Type 2 diabetes mellitus [facility])     Patient Active Problem List   Diagnosis Date Noted  . Acute cystitis without hematuria   . Seizure disorder (HCC) 04/23/2020  . Hyperosmolar non-ketotic state due to type 2 diabetes mellitus (HCC) 04/23/2020  . Nonketotic hyperglycinemia, type II (HCC) 04/22/2020  . SAH (subarachnoid hemorrhage) (HCC) 04/30/2019  . DKA, type 2, not at goal [facility]) 02/08/2019  . Status epilepticus (HCC) 02/08/2019  . Alcohol abuse 02/08/2019  . Acute metabolic encephalopathy 02/05/2019  . Diabetic acidosis without coma (HCC)   . H/O ETOH abuse   . C. difficile colitis 07/05/2018  . Syncope and collapse 07/03/2018  . Hyperglycemia 01/22/2018  . Essential hypertension 01/22/2018  . Alcohol withdrawal syndrome with complication, with  unspecified complication (HCC) 01/22/2018  . Elevated LFTs 01/22/2018  . Lactic acidosis 01/22/2018  . Hypothyroidism 01/22/2018  . Thrombocytopenia (HCC) 01/22/2018  . Hypomagnesemia 01/22/2018  . Acute renal injury (HCC) 01/22/2018    Past Surgical History:  Procedure Laterality Date  . denies    . IR ANGIO INTRA EXTRACRAN SEL COM CAROTID INNOMINATE UNI L MOD SED  05/04/2019  . IR ANGIO INTRA EXTRACRAN SEL INTERNAL CAROTID UNI R MOD SED  05/04/2019  . IR ANGIO VERTEBRAL SEL VERTEBRAL UNI R MOD SED  05/04/2019  . IR US GUIDE VASC ACCESS RIGHT  05/04/2019     OB History   No obstetric history on file.     Family History  Problem Relation Age of Onset  . Hypertension Mother     Social History   Tobacco Use  . Smoking status: Current Every Day Smoker    Packs/day: 0.50    Types: Cigarettes  . Smokeless tobacco: Never Used  Vaping Use  . Vaping Use: Never used  Substance Use Topics  . Alcohol use: Not Currently    Comment: intoxicated at this time  . Drug use: Never    Home Medications Prior to Admission medications   Medication Sig Start Date End Date Taking? Authorizing Provider  clindamycin (CLEOCIN) 150 MG capsule Take 2 capsules (300 mg total) by mouth 3 (three) times daily for 7 days. 01/30/21 02/06/21 Yes Alphonse Asbridge, PA-C  lidocaine (XYLOCAINE)  2 % solution Use as directed 15 mLs in the mouth or throat as needed for mouth pain. 01/30/21  Yes Kalliopi Coupland, PA-C  amLODipine (NORVASC) 10 MG tablet Take 10 mg by mouth daily.    [provider]  amoxicillin (AMOXIL) 500 MG capsule Take 1 capsule (500 mg total) by mouth every 8 (eight) hours. 04/26/20   Catarina Hartshorn, MD  glipiZIDE (GLUCOTROL) 10 MG tablet Take 10 mg by mouth daily. 08/21/20   [provider]  insulin NPH-regular Human (NOVOLIN 70/30) (70-30) 100 UNIT/ML injection Inject 18 Units into the skin 2 (two) times daily with a meal. 04/26/20   Tat, Onalee Hua, MD  Insulin Syringe-Needle U-100  (INSULIN SYRINGE 1CC/30GX1/2") 30G X 1/2" 1 ML MISC Use to dispense insulin as directed 04/26/20   Catarina Hartshorn, MD  levETIRAcetam (KEPPRA) 750 MG tablet Take 1 tablet (750 mg total) by mouth 2 (two) times daily. 02/08/19   Calvert Cantor, MD  levothyroxine (SYNTHROID) 75 MCG tablet Take 75 mcg by mouth daily. 11/26/20   [provider]  lisinopril (ZESTRIL) 10 MG tablet Take 1 tablet by mouth daily. 09/03/20   [provider]  magnesium oxide (MAG-OX) 400 (241.3 Mg) MG tablet Take 1 tablet (400 mg total) by mouth daily. 04/26/20   Catarina Hartshorn, MD  metFORMIN (GLUCOPHAGE) 1000 MG tablet Take 1,000 mg by mouth 2 (two) times daily with a meal.    [provider]  metoprolol tartrate (LOPRESSOR) 50 MG tablet Take 1 tablet (50 mg total) by mouth 2 (two) times daily. 02/08/19   Calvert Cantor, MD    Allergies    Patient has no known allergies.  Review of Systems   Review of Systems  Constitutional: Negative for fever.  HENT: Positive for dental problem and facial swelling.     Physical Exam Updated Vital Signs BP 140/82 (BP Location: Right Arm)   Pulse (!) 115   Temp 98.5 F (36.9 C) (Oral)   Resp 18   Ht 5\' 2"  (1.575 m)   Wt 55.8 kg   SpO2 100%   BMI 22.50 kg/m   Physical Exam Vitals and nursing note reviewed.  Constitutional:      General: She is not in acute distress.    Appearance: She is well-developed.     Comments: Sitting in the bed in NAD  HENT:     Head: Normocephalic and atraumatic.     Mouth/Throat:     Mouth: Mucous membranes are moist.     Dentition: Abnormal dentition. Dental tenderness, gingival swelling and dental caries present.      Comments: Overall poor dentition. Upper teeth mostly missing. L upper tooth cracked and TTP with surrounding gum edema. Swelling noted of the L upper lip extending towards the cheek. No erythema or skin induration. No trismus. No difficulty handling secretions Cardiovascular:     Rate and Rhythm: Regular rhythm.  Tachycardia present.     Pulses: Normal pulses.     Comments: Mildly tachycardic around 105 on my exam Pulmonary:     Effort: Pulmonary effort is normal.     Breath sounds: Normal breath sounds.  Abdominal:     General: There is no distension.  Musculoskeletal:        General: Normal range of motion.     Cervical back: Normal range of motion.  Skin:    General: Skin is warm.     Findings: No rash.  Neurological:     Mental Status: She is alert and oriented  to person, place, and time.     ED Results / Procedures / Treatments   Labs (all labs ordered are listed, but only abnormal results are displayed) Labs Reviewed - No data to display  EKG None  Radiology No results found.  Procedures Procedures   Medications Ordered in ED Medications - No data to display  ED Course  I have reviewed the triage vital signs and the nursing notes.  Pertinent labs & imaging results that were available during my care of the patient were reviewed by me and considered in my medical decision making (see chart for details).    MDM Rules/Calculators/A&P                          Pt presenting for evaluation of dental pain and facial swelling. On exam, pt appears nontoxic. There is ttp the the L upper tooth with signs of decay. associated upper lip swelling. Exam is more consistent with infection than angioedema. No drainable asscess at this time. Pt is mildly tachycardic, likely due to pain and pt not having metoprolol. Will d/c with abx and encourage close f/u with dentistry. At this time, pt appears safe for d/c. Return precautions given. Pt states she understands and agrees to plan.   Final Clinical Impression(s) / ED Diagnoses Final diagnoses:  Dental infection  Facial swelling    Rx / DC Orders ED Discharge Orders         Ordered    clindamycin (CLEOCIN) 150 MG capsule  3 times daily        01/30/21 0916    lidocaine (XYLOCAINE) 2 % solution  As needed        01/30/21 0916            Alveria Apley, PA-C 01/30/21 [id number]    Mancel Bale, MD 01/31/21 [phone]

## 2021-01-30 NOTE — ED Triage Notes (Signed)
Pt presents to ED with complaints of top lip swelling x 2 days. Pt denies any new medications. Pt states she has a tooth chipped on the top that it may be coming from but states the tooth has been chipped over a year.

## 2021-05-05 ENCOUNTER — Encounter (HOSPITAL_COMMUNITY): Payer: Self-pay

## 2021-05-05 ENCOUNTER — Other Ambulatory Visit: Payer: Self-pay

## 2021-05-05 ENCOUNTER — Emergency Department (HOSPITAL_COMMUNITY)
Admission: EM | Admit: 2021-05-05 | Discharge: 2021-05-06 | Disposition: A | Discharge status: Home / Self Care | Payer: Medicare HMO | Attending: Emergency Medicine | Admitting: Emergency Medicine

## 2021-05-05 DIAGNOSIS — N611 Abscess of the breast and nipple: Secondary | ICD-10-CM | POA: Insufficient documentation

## 2021-05-05 DIAGNOSIS — F17210 Nicotine dependence, cigarettes, uncomplicated: Secondary | ICD-10-CM | POA: Insufficient documentation

## 2021-05-05 DIAGNOSIS — Z79899 Other long term (current) drug therapy: Secondary | ICD-10-CM | POA: Diagnosis not present

## 2021-05-05 DIAGNOSIS — Z794 Long term (current) use of insulin: Secondary | ICD-10-CM | POA: Insufficient documentation

## 2021-05-05 DIAGNOSIS — E1165 Type 2 diabetes mellitus with hyperglycemia: Secondary | ICD-10-CM | POA: Insufficient documentation

## 2021-05-05 DIAGNOSIS — I10 Essential (primary) hypertension: Secondary | ICD-10-CM | POA: Diagnosis not present

## 2021-05-05 DIAGNOSIS — N6459 Other signs and symptoms in breast: Secondary | ICD-10-CM | POA: Diagnosis present

## 2021-05-05 DIAGNOSIS — R739 Hyperglycemia, unspecified: Secondary | ICD-10-CM

## 2021-05-05 DIAGNOSIS — Z7984 Long term (current) use of oral hypoglycemic drugs: Secondary | ICD-10-CM | POA: Insufficient documentation

## 2021-05-05 DIAGNOSIS — E039 Hypothyroidism, unspecified: Secondary | ICD-10-CM | POA: Insufficient documentation

## 2021-05-05 MED ORDER — LIDOCAINE HCL (PF) 2 % IJ SOLN: 10.0000 mL | Freq: Once | INTRAMUSCULAR | Status: DC

## 2021-05-05 NOTE — ED Provider Notes (Signed)
[facility] EMERGENCY DEPARTMENT Provider Note   CSN: [id number] Arrival date & time: 05/05/21  2121     History Chief Complaint  Patient presents with   Abscess    Shannon Herrera is a 65 y.o. female.  The history is provided by the patient.  Abscess She has history of hypertension, diabetes, seizure disorder, alcohol abuse and comes in because of a painful swollen area of her right breast.  She states that she has been getting pimples there periodically over the years, but about 3 days ago, the area got much more swollen and painful.  She rates pain at 10/10.  She denies fever or chills.  She denies any trauma.  She states she thinks her last mammogram was several years ago, but she is not sure the exact time.   Past Medical History:  Diagnosis Date   Alcoholism (HCC)    Diabetes mellitus without complication (HCC)    Hypertension    Hypothyroid    Seizures (HCC)    Type 2 diabetes mellitus (HCC)     Patient Active Problem List   Diagnosis Date Noted   Acute cystitis without hematuria    Seizure disorder (HCC) 04/23/2020   Hyperosmolar non-ketotic state due to type 2 diabetes mellitus (HCC) 04/23/2020   Nonketotic hyperglycinemia, type II (HCC) 04/22/2020   SAH (subarachnoid hemorrhage) (HCC) 04/30/2019   DKA, type 2, not at goal [facility]) 02/08/2019   Status epilepticus (HCC) 02/08/2019   Alcohol abuse 02/08/2019   Acute metabolic encephalopathy 02/05/2019   Diabetic acidosis without coma (HCC)    H/O ETOH abuse    C. difficile colitis 07/05/2018   Syncope and collapse 07/03/2018   Hyperglycemia 01/22/2018   Essential hypertension 01/22/2018   Alcohol withdrawal syndrome with complication, with unspecified complication (HCC) 01/22/2018   Elevated LFTs 01/22/2018   Lactic acidosis 01/22/2018   Hypothyroidism 01/22/2018   Thrombocytopenia (HCC) 01/22/2018   Hypomagnesemia 01/22/2018   Acute renal injury (HCC) 01/22/2018    Past Surgical History:  Procedure  Laterality Date   denies     IR ANGIO INTRA EXTRACRAN SEL COM CAROTID INNOMINATE UNI L MOD SED  05/04/2019   IR ANGIO INTRA EXTRACRAN SEL INTERNAL CAROTID UNI R MOD SED  05/04/2019   IR ANGIO VERTEBRAL SEL VERTEBRAL UNI R MOD SED  05/04/2019   IR US GUIDE VASC ACCESS RIGHT  05/04/2019     OB History   No obstetric history on file.     Family History  Problem Relation Age of Onset   Hypertension Mother     Social History   Tobacco Use   Smoking status: Every Day    Packs/day: 0.50    Types: Cigarettes   Smokeless tobacco: Never  Vaping Use   Vaping Use: Never used  Substance Use Topics   Alcohol use: Not Currently    Comment: intoxicated at this time   Drug use: Never    Home Medications Prior to Admission medications   Medication Sig Start Date End Date Taking? Authorizing Provider  amLODipine (NORVASC) 10 MG tablet Take 10 mg by mouth daily.    [provider]  amoxicillin (AMOXIL) 500 MG capsule Take 1 capsule (500 mg total) by mouth every 8 (eight) hours. 04/26/20   Catarina Hartshorn, MD  glipiZIDE (GLUCOTROL) 10 MG tablet Take 10 mg by mouth daily. 08/21/20   [provider]  insulin NPH-regular Human (NOVOLIN 70/30) (70-30) 100 UNIT/ML injection Inject 18 Units into the skin 2 (two) times daily  with a meal. 04/26/20   Tat, Onalee Hua, MD  Insulin Syringe-Needle U-100 (INSULIN SYRINGE 1CC/30GX1/2") 30G X 1/2" 1 ML MISC Use to dispense insulin as directed 04/26/20   Tat, Onalee Hua, MD  levETIRAcetam (KEPPRA) 750 MG tablet Take 1 tablet (750 mg total) by mouth 2 (two) times daily. 02/08/19   Calvert Cantor, MD  levothyroxine (SYNTHROID) 75 MCG tablet Take 75 mcg by mouth daily. 11/26/20   [provider]  lidocaine (XYLOCAINE) 2 % solution Use as directed 15 mLs in the mouth or throat as needed for mouth pain. 01/30/21   Caccavale, Sophia, PA-C  lisinopril (ZESTRIL) 10 MG tablet Take 1 tablet by mouth daily. 09/03/20   [provider]  magnesium oxide (MAG-OX)  400 (241.3 Mg) MG tablet Take 1 tablet (400 mg total) by mouth daily. 04/26/20   Catarina Hartshorn, MD  metFORMIN (GLUCOPHAGE) 1000 MG tablet Take 1,000 mg by mouth 2 (two) times daily with a meal.    [provider]  metoprolol tartrate (LOPRESSOR) 50 MG tablet Take 1 tablet (50 mg total) by mouth 2 (two) times daily. 02/08/19   Calvert Cantor, MD    Allergies    Patient has no known allergies.  Review of Systems   Review of Systems  All other systems reviewed and are negative.  Physical Exam Updated Vital Signs BP (!) 178/86 (BP Location: Right Arm)   Pulse 93   Temp 98.5 F (36.9 C) (Oral)   Resp 16   Ht 5\' 2"  (1.575 m)   Wt 54.4 kg   SpO2 99%   BMI 21.95 kg/m   Physical Exam Vitals and nursing note reviewed.  65 year old female, resting comfortably and in no acute distress. Vital signs are significant for elevated blood pressure. Oxygen saturation is 99%, which is normal. Head is normocephalic and atraumatic. PERRLA, EOMI. Oropharynx is clear. Neck is nontender and supple without adenopathy or JVD. Back is nontender and there is no CVA tenderness. Lungs are clear without rales, wheezes, or rhonchi. Chest: There is an abscess present just superior to the nipple of the right breast.  Abscess measures 4.5 cm x 4 cm.  There is overlying erythema and an area of induration that surrounds it. Heart has regular rate and rhythm without murmur. Abdomen is soft, flat, nontender without masses or hepatosplenomegaly and peristalsis is normoactive. Extremities have no cyanosis or edema, full range of motion is present. Skin is warm and dry without rash. Neurologic: Mental status is normal, cranial nerves are intact, there are no motor or sensory deficits.  ED Results / Procedures / Treatments   Labs (all labs ordered are listed, but only abnormal results are displayed) Labs Reviewed  CBG MONITORING, ED - Abnormal; Notable for the following components:      Result Value    Glucose-Capillary 465 (*)    All other components within normal limits    Procedures .71Incision and Drainage  Date/Time: 05/06/2021 12:54 AM Performed by: 05/08/2021, MD Authorized by: Dione Booze, MD   Consent:    Consent obtained:  Verbal   Consent given by:  Patient   Risks, benefits, and alternatives were discussed: yes     Risks discussed:  Bleeding, incomplete drainage and pain   Alternatives discussed:  Alternative treatment Universal protocol:    Procedure explained and questions answered to patient or proxy's satisfaction: yes     Relevant documents present and verified: yes     Required blood products, implants, devices, and special equipment available:  yes     Site/side marked: yes     Immediately prior to procedure, a time out was called: yes     Patient identity confirmed:  Verbally with patient and arm band Location:    Type:  Abscess   Size:  4.5 cm   Location:  Trunk   Trunk location:  R breast Pre-procedure details:    Skin preparation:  Chlorhexidine with alcohol Sedation:    Sedation type:  None Anesthesia:    Anesthesia method:  Local infiltration   Local anesthetic:  Lidocaine 2% w/o epi Procedure type:    Complexity:  Complex Procedure details:    Ultrasound guidance: no     Needle aspiration: no     Incision types:  Single straight   Incision depth:  Subcutaneous   Wound management:  Probed and deloculated   Drainage:  Purulent   Drainage amount:  Moderate   Wound treatment:  Wound left open   Packing materials:  None Post-procedure details:    Procedure completion:  Tolerated well, no immediate complications   Medications Ordered in ED Medications  insulin aspart (novoLOG) injection 10 Units (has no administration in time range)  lidocaine (PF) (XYLOCAINE) 1 % injection 5 mL (5 mLs Infiltration Given 05/06/21 0034)  doxycycline (VIBRA-TABS) tablet 100 mg (100 mg Oral Given 05/06/21 0056)    ED Course  I have reviewed the triage vital  signs and the nursing notes.  Pertinent labs results that were available during my care of the patient were reviewed by me and considered in my medical decision making (see chart for details).   MDM Rules/Calculators/A&P                         Right breast abscess treated with incision and drainage.  Old records are reviewed, and I actually do not see any record of a prior mammogram.  She will need close follow-up and should probably have follow-up mammogram to make sure there is not an underlying cancer behind this.  Glucose is elevated at 465.  She is given an injection of insulin and discharged with a prescription for doxycycline.  Follow-up with primary care provider in 1 week.  Final Clinical Impression(s) / ED Diagnoses Final diagnoses:  Abscess of right breast  Hyperglycemia  Elevated blood pressure reading with diagnosis of hypertension    Rx / DC Orders ED Discharge Orders          Ordered    doxycycline (VIBRAMYCIN) 100 MG capsule  2 times daily        05/06/21 0053             Dione Booze, MD 05/06/21 [phone]

## 2021-05-05 NOTE — ED Triage Notes (Signed)
Abscess to right breast above nipple. States its been there for a while and she will drain it herself but this time it is way worse and more painful.

## 2021-05-06 LAB — CBG MONITORING, ED: Glucose-Capillary: 465 mg/dL — ABNORMAL HIGH (ref 70–99)

## 2021-05-06 MED ORDER — DOXYCYCLINE HYCLATE 100 MG PO TABS
100.0000 mg | ORAL_TABLET | Freq: Once | ORAL | Status: AC
  Administered 2021-05-06: 100 mg via ORAL
  Filled 2021-05-06: qty 1

## 2021-05-06 MED ORDER — LIDOCAINE HCL (PF) 1 % IJ SOLN
5.0000 mL | Freq: Once | INTRAMUSCULAR | Status: AC
  Administered 2021-05-06: 5 mL
  Filled 2021-05-06: qty 30

## 2021-05-06 MED ORDER — INSULIN ASPART 100 UNIT/ML IV SOLN
10.0000 [IU] | Freq: Once | INTRAVENOUS | Status: AC
  Administered 2021-05-06: 10 [IU] via SUBCUTANEOUS

## 2021-05-06 MED ORDER — DOXYCYCLINE HYCLATE 100 MG PO CAPS: 100.0000 mg | ORAL_CAPSULE | Freq: Two times a day (BID) | ORAL | 0 refills | Status: DC

## 2021-05-06 NOTE — Discharge Instructions (Addendum)
Please monitor your blood pressure and your blood sugar at home.  Please follow-up with your primary care provider.  You should have a mammogram done to make sure there is not an underlying cancer.

## 2021-05-06 NOTE — ED Notes (Signed)
I & D set up as well as lidocaine at bedside.

## 2021-05-06 NOTE — ED Notes (Signed)
Dressing applied to right breast

## 2021-10-17 ENCOUNTER — Other Ambulatory Visit: Payer: Self-pay

## 2021-10-17 ENCOUNTER — Emergency Department (HOSPITAL_COMMUNITY): Payer: Medicare HMO

## 2021-10-17 ENCOUNTER — Encounter (HOSPITAL_COMMUNITY): Payer: Self-pay

## 2021-10-17 ENCOUNTER — Inpatient Hospital Stay (HOSPITAL_COMMUNITY)
Admission: EM | Admit: 2021-10-17 | Discharge: 2021-10-21 | DRG: 637 | Disposition: A | Discharge status: Home / Self Care | Payer: Medicare HMO | Attending: Internal Medicine | Admitting: Internal Medicine

## 2021-10-17 DIAGNOSIS — Z79899 Other long term (current) drug therapy: Secondary | ICD-10-CM | POA: Diagnosis not present

## 2021-10-17 DIAGNOSIS — Z9114 Patient's other noncompliance with medication regimen: Secondary | ICD-10-CM

## 2021-10-17 DIAGNOSIS — Z20822 Contact with and (suspected) exposure to covid-19: Secondary | ICD-10-CM | POA: Diagnosis present

## 2021-10-17 DIAGNOSIS — G9341 Metabolic encephalopathy: Secondary | ICD-10-CM | POA: Diagnosis present

## 2021-10-17 DIAGNOSIS — I169 Hypertensive crisis, unspecified: Secondary | ICD-10-CM | POA: Diagnosis not present

## 2021-10-17 DIAGNOSIS — E876 Hypokalemia: Secondary | ICD-10-CM | POA: Diagnosis present

## 2021-10-17 DIAGNOSIS — E11649 Type 2 diabetes mellitus with hypoglycemia without coma: Secondary | ICD-10-CM | POA: Diagnosis not present

## 2021-10-17 DIAGNOSIS — E1110 Type 2 diabetes mellitus with ketoacidosis without coma: Principal | ICD-10-CM

## 2021-10-17 DIAGNOSIS — D649 Anemia, unspecified: Secondary | ICD-10-CM | POA: Diagnosis present

## 2021-10-17 DIAGNOSIS — E785 Hyperlipidemia, unspecified: Secondary | ICD-10-CM | POA: Diagnosis present

## 2021-10-17 DIAGNOSIS — Z8249 Family history of ischemic heart disease and other diseases of the circulatory system: Secondary | ICD-10-CM

## 2021-10-17 DIAGNOSIS — F1010 Alcohol abuse, uncomplicated: Secondary | ICD-10-CM | POA: Diagnosis present

## 2021-10-17 DIAGNOSIS — Z7984 Long term (current) use of oral hypoglycemic drugs: Secondary | ICD-10-CM

## 2021-10-17 DIAGNOSIS — R4182 Altered mental status, unspecified: Secondary | ICD-10-CM | POA: Diagnosis not present

## 2021-10-17 DIAGNOSIS — I160 Hypertensive urgency: Secondary | ICD-10-CM | POA: Diagnosis present

## 2021-10-17 DIAGNOSIS — F17210 Nicotine dependence, cigarettes, uncomplicated: Secondary | ICD-10-CM | POA: Diagnosis present

## 2021-10-17 DIAGNOSIS — E039 Hypothyroidism, unspecified: Secondary | ICD-10-CM | POA: Diagnosis present

## 2021-10-17 DIAGNOSIS — R059 Cough, unspecified: Secondary | ICD-10-CM | POA: Diagnosis not present

## 2021-10-17 DIAGNOSIS — R569 Unspecified convulsions: Secondary | ICD-10-CM

## 2021-10-17 DIAGNOSIS — N390 Urinary tract infection, site not specified: Secondary | ICD-10-CM | POA: Diagnosis present

## 2021-10-17 DIAGNOSIS — K219 Gastro-esophageal reflux disease without esophagitis: Secondary | ICD-10-CM | POA: Diagnosis present

## 2021-10-17 DIAGNOSIS — Z794 Long term (current) use of insulin: Secondary | ICD-10-CM | POA: Diagnosis not present

## 2021-10-17 DIAGNOSIS — Z79890 Hormone replacement therapy (postmenopausal): Secondary | ICD-10-CM | POA: Diagnosis not present

## 2021-10-17 DIAGNOSIS — I10 Essential (primary) hypertension: Secondary | ICD-10-CM | POA: Diagnosis present

## 2021-10-17 DIAGNOSIS — Z781 Physical restraint status: Secondary | ICD-10-CM

## 2021-10-17 DIAGNOSIS — G40909 Epilepsy, unspecified, not intractable, without status epilepticus: Secondary | ICD-10-CM | POA: Diagnosis present

## 2021-10-17 LAB — CBC WITH DIFFERENTIAL/PLATELET
Abs Immature Granulocytes: 0.01 10*3/uL (ref 0.00–0.07)
Basophils Absolute: 0 10*3/uL (ref 0.0–0.1)
Basophils Relative: 0 %
Eosinophils Absolute: 0.1 10*3/uL (ref 0.0–0.5)
Eosinophils Relative: 2 %
HCT: 37.1 % (ref 36.0–46.0)
Hemoglobin: 13.3 g/dL (ref 12.0–15.0)
Immature Granulocytes: 0 %
Lymphocytes Relative: 20 %
Lymphs Abs: 0.9 10*3/uL (ref 0.7–4.0)
MCH: 33.2 pg (ref 26.0–34.0)
MCHC: 35.8 g/dL (ref 30.0–36.0)
MCV: 92.5 fL (ref 80.0–100.0)
Monocytes Absolute: 0.3 10*3/uL (ref 0.1–1.0)
Monocytes Relative: 7 %
Neutro Abs: 3 10*3/uL (ref 1.7–7.7)
Neutrophils Relative %: 71 %
Platelets: 84 10*3/uL — ABNORMAL LOW (ref 150–400)
RBC: 4.01 MIL/uL (ref 3.87–5.11)
RDW: 12.3 % (ref 11.5–15.5)
WBC: 4.3 10*3/uL (ref 4.0–10.5)
nRBC: 0 % (ref 0.0–0.2)

## 2021-10-17 LAB — CBG MONITORING, ED
Glucose-Capillary: >600 mg/dL (ref 70–99)
Glucose-Capillary: 569 mg/dL (ref 70–99)
Glucose-Capillary: 544 mg/dL (ref 70–99)
Glucose-Capillary: 566 mg/dL (ref 70–99)
Glucose-Capillary: 437 mg/dL — ABNORMAL HIGH (ref 70–99)
Glucose-Capillary: 475 mg/dL — ABNORMAL HIGH (ref 70–99)
Glucose-Capillary: 357 mg/dL — ABNORMAL HIGH (ref 70–99)
Glucose-Capillary: 332 mg/dL — ABNORMAL HIGH (ref 70–99)
Glucose-Capillary: 271 mg/dL — ABNORMAL HIGH (ref 70–99)
Glucose-Capillary: 234 mg/dL — ABNORMAL HIGH (ref 70–99)
Glucose-Capillary: 171 mg/dL — ABNORMAL HIGH (ref 70–99)
Glucose-Capillary: 167 mg/dL — ABNORMAL HIGH (ref 70–99)

## 2021-10-17 LAB — COMPREHENSIVE METABOLIC PANEL
ALT: 68 U/L — ABNORMAL HIGH (ref 0–44)
AST: 54 U/L — ABNORMAL HIGH (ref 15–41)
Albumin: 4 g/dL (ref 3.5–5.0)
Alkaline Phosphatase: 79 U/L (ref 38–126)
Anion gap: 18 — ABNORMAL HIGH (ref 5–15)
BUN: 16 mg/dL (ref 8–23)
CO2: 16 mmol/L — ABNORMAL LOW (ref 22–32)
Calcium: 9.1 mg/dL (ref 8.9–10.3)
Chloride: 99 mmol/L (ref 98–111)
Creatinine, Ser: 1.03 mg/dL — ABNORMAL HIGH (ref 0.44–1.00)
GFR, Estimated: 60 mL/min — ABNORMAL LOW (ref >60)
Glucose, Bld: 676 mg/dL (ref 70–99)
Potassium: 3.2 mmol/L — ABNORMAL LOW (ref 3.5–5.1)
Sodium: 133 mmol/L — ABNORMAL LOW (ref 135–145)
Total Bilirubin: 0.8 mg/dL (ref 0.3–1.2)
Total Protein: 7.3 g/dL (ref 6.5–8.1)

## 2021-10-17 LAB — RAPID URINE DRUG SCREEN, HOSP PERFORMED
Amphetamines: NOT DETECTED
Barbiturates: NOT DETECTED
Benzodiazepines: POSITIVE — AB
Cocaine: NOT DETECTED
Opiates: NOT DETECTED
Tetrahydrocannabinol: NOT DETECTED

## 2021-10-17 LAB — BASIC METABOLIC PANEL
Anion gap: 17 — ABNORMAL HIGH (ref 5–15)
Anion gap: 13 (ref 5–15)
Anion gap: 15 (ref 5–15)
BUN: 17 mg/dL (ref 8–23)
BUN: 17 mg/dL (ref 8–23)
BUN: 15 mg/dL (ref 8–23)
CO2: 19 mmol/L — ABNORMAL LOW (ref 22–32)
CO2: 20 mmol/L — ABNORMAL LOW (ref 22–32)
CO2: 18 mmol/L — ABNORMAL LOW (ref 22–32)
Calcium: 9.3 mg/dL (ref 8.9–10.3)
Calcium: 9.9 mg/dL (ref 8.9–10.3)
Calcium: 9.5 mg/dL (ref 8.9–10.3)
Chloride: 102 mmol/L (ref 98–111)
Chloride: 109 mmol/L (ref 98–111)
Chloride: 109 mmol/L (ref 98–111)
Creatinine, Ser: 0.97 mg/dL (ref 0.44–1.00)
Creatinine, Ser: 0.84 mg/dL (ref 0.44–1.00)
Creatinine, Ser: 0.77 mg/dL (ref 0.44–1.00)
GFR, Estimated: >60 mL/min (ref >60)
GFR, Estimated: >60 mL/min (ref >60)
GFR, Estimated: >60 mL/min (ref >60)
Glucose, Bld: 519 mg/dL (ref 70–99)
Glucose, Bld: 265 mg/dL — ABNORMAL HIGH (ref 70–99)
Glucose, Bld: 200 mg/dL — ABNORMAL HIGH (ref 70–99)
Potassium: 3.1 mmol/L — ABNORMAL LOW (ref 3.5–5.1)
Potassium: 3.3 mmol/L — ABNORMAL LOW (ref 3.5–5.1)
Potassium: 3.6 mmol/L (ref 3.5–5.1)
Sodium: 138 mmol/L (ref 135–145)
Sodium: 142 mmol/L (ref 135–145)
Sodium: 142 mmol/L (ref 135–145)

## 2021-10-17 LAB — URINALYSIS, ROUTINE W REFLEX MICROSCOPIC
Bacteria, UA: NONE SEEN
Bilirubin Urine: NEGATIVE
Glucose, UA: >=500 mg/dL — AB
Ketones, ur: 20 mg/dL — AB
Leukocytes,Ua: NEGATIVE
Nitrite: POSITIVE — AB
Protein, ur: 100 mg/dL — AB
Specific Gravity, Urine: 1.017 (ref 1.005–1.030)
pH: 6 (ref 5.0–8.0)

## 2021-10-17 LAB — PHOSPHORUS: Phosphorus: 3.6 mg/dL (ref 2.5–4.6)

## 2021-10-17 LAB — BLOOD GAS, VENOUS
Acid-base deficit: 5 mmol/L — ABNORMAL HIGH (ref 0.0–2.0)
Bicarbonate: 19.8 mmol/L — ABNORMAL LOW (ref 20.0–28.0)
Drawn by: 5509
FIO2: 21
O2 Saturation: 83.9 %
Patient temperature: 36.3
pCO2, Ven: 40.6 mmHg — ABNORMAL LOW (ref 44.0–60.0)
pH, Ven: 7.315 (ref 7.250–7.430)
pO2, Ven: 50.8 mmHg — ABNORMAL HIGH (ref 32.0–45.0)

## 2021-10-17 LAB — ETHANOL: Alcohol, Ethyl (B): <10 mg/dL (ref <10)

## 2021-10-17 LAB — RESP PANEL BY RT-PCR (FLU A&B, COVID) ARPGX2
Influenza A by PCR: NEGATIVE
Influenza B by PCR: NEGATIVE
SARS Coronavirus 2 by RT PCR: NEGATIVE

## 2021-10-17 LAB — MRSA NEXT GEN BY PCR, NASAL: MRSA by PCR Next Gen: NOT DETECTED

## 2021-10-17 LAB — AMMONIA: Ammonia: 21 umol/L (ref 9–35)

## 2021-10-17 LAB — BETA-HYDROXYBUTYRIC ACID
Beta-Hydroxybutyric Acid: 0.57 mmol/L — ABNORMAL HIGH (ref 0.05–0.27)
Beta-Hydroxybutyric Acid: 2.71 mmol/L — ABNORMAL HIGH (ref 0.05–0.27)
Beta-Hydroxybutyric Acid: 0.35 mmol/L — ABNORMAL HIGH (ref 0.05–0.27)

## 2021-10-17 LAB — TROPONIN I (HIGH SENSITIVITY): Troponin I (High Sensitivity): 6 ng/L (ref <18)

## 2021-10-17 LAB — GLUCOSE, CAPILLARY
Glucose-Capillary: 169 mg/dL — ABNORMAL HIGH (ref 70–99)
Glucose-Capillary: 170 mg/dL — ABNORMAL HIGH (ref 70–99)
Glucose-Capillary: 188 mg/dL — ABNORMAL HIGH (ref 70–99)

## 2021-10-17 LAB — MAGNESIUM: Magnesium: 1.9 mg/dL (ref 1.7–2.4)

## 2021-10-17 LAB — TSH: TSH: 2.656 u[IU]/mL (ref 0.350–4.500)

## 2021-10-17 LAB — VITAMIN B12: Vitamin B-12: 497 pg/mL (ref 180–914)

## 2021-10-17 MED ORDER — ACETAMINOPHEN 650 MG RE SUPP: 650.0000 mg | Freq: Four times a day (QID) | RECTAL | Status: DC | PRN

## 2021-10-17 MED ORDER — LORAZEPAM 2 MG/ML IJ SOLN
1.0000 mg | Freq: Once | INTRAMUSCULAR | Status: AC
  Administered 2021-10-17: 1 mg via INTRAVENOUS
  Filled 2021-10-17: qty 1

## 2021-10-17 MED ORDER — LEVETIRACETAM IN NACL 1500 MG/100ML IV SOLN
1500.0000 mg | Freq: Once | INTRAVENOUS | Status: AC
  Administered 2021-10-17: 1500 mg via INTRAVENOUS
  Filled 2021-10-17: qty 100

## 2021-10-17 MED ORDER — LORAZEPAM 2 MG/ML IJ SOLN
0.0000 mg | Freq: Two times a day (BID) | INTRAMUSCULAR | Status: AC
  Administered 2021-10-20: 2 mg via INTRAVENOUS
  Filled 2021-10-17: qty 1

## 2021-10-17 MED ORDER — LORAZEPAM 2 MG/ML IJ SOLN
0.0000 mg | Freq: Four times a day (QID) | INTRAMUSCULAR | Status: AC
  Administered 2021-10-17: 2 mg via INTRAVENOUS
  Filled 2021-10-17 (×2): qty 1

## 2021-10-17 MED ORDER — LORAZEPAM 2 MG/ML IJ SOLN
1.0000 mg | INTRAMUSCULAR | Status: AC | PRN
  Administered 2021-10-17 – 2021-10-18 (×3): 2 mg via INTRAVENOUS
  Filled 2021-10-17 (×2): qty 1

## 2021-10-17 MED ORDER — SODIUM CHLORIDE 0.9 % IV SOLN
750.0000 mg | Freq: Two times a day (BID) | INTRAVENOUS | Status: DC
  Administered 2021-10-17 – 2021-10-19 (×4): 750 mg via INTRAVENOUS
  Filled 2021-10-17 (×6): qty 7.5

## 2021-10-17 MED ORDER — SODIUM CHLORIDE 0.9 % IV SOLN
2.0000 g | INTRAVENOUS | Status: DC
  Administered 2021-10-17 – 2021-10-20 (×4): 2 g via INTRAVENOUS
  Filled 2021-10-17 (×4): qty 20

## 2021-10-17 MED ORDER — THIAMINE HCL 100 MG/ML IJ SOLN
100.0000 mg | Freq: Every day | INTRAMUSCULAR | Status: DC
  Administered 2021-10-17 – 2021-10-19 (×3): 100 mg via INTRAVENOUS
  Filled 2021-10-17 (×4): qty 2

## 2021-10-17 MED ORDER — INSULIN REGULAR(HUMAN) IN NACL 100-0.9 UT/100ML-% IV SOLN
INTRAVENOUS | Status: DC
  Administered 2021-10-17: 9 [IU]/h via INTRAVENOUS
  Administered 2021-10-18: 0.6 [IU]/h via INTRAVENOUS
  Administered 2021-10-18: 2.4 [IU]/h via INTRAVENOUS
  Filled 2021-10-17 (×2): qty 100

## 2021-10-17 MED ORDER — CHLORHEXIDINE GLUCONATE CLOTH 2 % EX PADS
6.0000 | MEDICATED_PAD | Freq: Every day | CUTANEOUS | Status: DC
  Administered 2021-10-18 – 2021-10-20 (×3): 6 via TOPICAL

## 2021-10-17 MED ORDER — ACETAMINOPHEN 325 MG PO TABS: 650.0000 mg | ORAL_TABLET | Freq: Four times a day (QID) | ORAL | Status: DC | PRN

## 2021-10-17 MED ORDER — INSULIN REGULAR(HUMAN) IN NACL 100-0.9 UT/100ML-% IV SOLN: INTRAVENOUS | Status: DC

## 2021-10-17 MED ORDER — LACTATED RINGERS IV BOLUS
20.0000 mL/kg | Freq: Once | INTRAVENOUS | Status: AC
  Administered 2021-10-17: 1088 mL via INTRAVENOUS

## 2021-10-17 MED ORDER — ONDANSETRON HCL 4 MG PO TABS: 4.0000 mg | ORAL_TABLET | Freq: Four times a day (QID) | ORAL | Status: DC | PRN

## 2021-10-17 MED ORDER — DEXMEDETOMIDINE HCL IN NACL 400 MCG/100ML IV SOLN
0.4000 ug/kg/h | INTRAVENOUS | Status: DC
  Administered 2021-10-17 – 2021-10-18 (×2): 0.4 ug/kg/h via INTRAVENOUS
  Filled 2021-10-17 (×2): qty 100

## 2021-10-17 MED ORDER — POTASSIUM CHLORIDE 10 MEQ/100ML IV SOLN
10.0000 meq | INTRAVENOUS | Status: AC
  Administered 2021-10-17 (×2): 10 meq via INTRAVENOUS
  Filled 2021-10-17 (×2): qty 100

## 2021-10-17 MED ORDER — FOLIC ACID 1 MG PO TABS
1.0000 mg | ORAL_TABLET | Freq: Every day | ORAL | Status: DC
  Administered 2021-10-20 – 2021-10-21 (×2): 1 mg via ORAL
  Filled 2021-10-17 (×2): qty 1

## 2021-10-17 MED ORDER — ADULT MULTIVITAMIN W/MINERALS CH
1.0000 | ORAL_TABLET | Freq: Every day | ORAL | Status: DC
  Administered 2021-10-20 – 2021-10-21 (×2): 1 via ORAL
  Filled 2021-10-17 (×2): qty 1

## 2021-10-17 MED ORDER — METOPROLOL TARTRATE 5 MG/5ML IV SOLN
5.0000 mg | Freq: Three times a day (TID) | INTRAVENOUS | Status: DC
  Administered 2021-10-17 – 2021-10-18 (×3): 5 mg via INTRAVENOUS
  Filled 2021-10-17 (×3): qty 5

## 2021-10-17 MED ORDER — LORAZEPAM 1 MG PO TABS: 1.0000 mg | ORAL_TABLET | ORAL | Status: AC | PRN

## 2021-10-17 MED ORDER — ONDANSETRON HCL 4 MG/2ML IJ SOLN: 4.0000 mg | Freq: Four times a day (QID) | INTRAMUSCULAR | Status: DC | PRN

## 2021-10-17 MED ORDER — LEVOTHYROXINE SODIUM 100 MCG/5ML IV SOLN
37.5000 ug | Freq: Every day | INTRAVENOUS | Status: DC
  Filled 2021-10-17: qty 5

## 2021-10-17 MED ORDER — ENOXAPARIN SODIUM 40 MG/0.4ML IJ SOSY
40.0000 mg | PREFILLED_SYRINGE | INTRAMUSCULAR | Status: DC
  Administered 2021-10-17 – 2021-10-20 (×4): 40 mg via SUBCUTANEOUS
  Filled 2021-10-17 (×5): qty 0.4

## 2021-10-17 MED ORDER — HALOPERIDOL LACTATE 5 MG/ML IJ SOLN
2.0000 mg | Freq: Once | INTRAMUSCULAR | Status: AC
  Administered 2021-10-17: 2 mg via INTRAVENOUS
  Filled 2021-10-17: qty 1

## 2021-10-17 MED ORDER — DEXTROSE IN LACTATED RINGERS 5 % IV SOLN: INTRAVENOUS | Status: DC

## 2021-10-17 MED ORDER — MIDAZOLAM HCL 5 MG/5ML IJ SOLN
5.0000 mg | Freq: Once | INTRAMUSCULAR | Status: AC
  Administered 2021-10-17: 5 mg via INTRAVENOUS
  Filled 2021-10-17: qty 5

## 2021-10-17 MED ORDER — HYDRALAZINE HCL 20 MG/ML IJ SOLN
10.0000 mg | Freq: Three times a day (TID) | INTRAMUSCULAR | Status: DC | PRN
  Administered 2021-10-17 – 2021-10-20 (×3): 10 mg via INTRAVENOUS
  Filled 2021-10-17 (×3): qty 1

## 2021-10-17 MED ORDER — THIAMINE HCL 100 MG PO TABS
100.0000 mg | ORAL_TABLET | Freq: Every day | ORAL | Status: DC
  Administered 2021-10-20 – 2021-10-21 (×2): 100 mg via ORAL
  Filled 2021-10-17 (×2): qty 1

## 2021-10-17 MED ORDER — SODIUM CHLORIDE 0.9 % IV BOLUS
1000.0000 mL | Freq: Once | INTRAVENOUS | Status: AC
  Administered 2021-10-17: 1000 mL via INTRAVENOUS

## 2021-10-17 MED ORDER — LACTATED RINGERS IV SOLN: INTRAVENOUS | Status: DC

## 2021-10-17 MED ORDER — HYDRALAZINE HCL 20 MG/ML IJ SOLN
10.0000 mg | Freq: Once | INTRAMUSCULAR | Status: AC
  Administered 2021-10-17: 10 mg via INTRAVENOUS
  Filled 2021-10-17: qty 1

## 2021-10-17 MED ORDER — DEXTROSE 50 % IV SOLN: 0.0000 mL | INTRAVENOUS | Status: DC | PRN

## 2021-10-17 MED ORDER — ZIPRASIDONE MESYLATE 20 MG IM SOLR
15.0000 mg | Freq: Once | INTRAMUSCULAR | Status: AC
  Administered 2021-10-17: 15 mg via INTRAMUSCULAR
  Filled 2021-10-17: qty 20

## 2021-10-17 NOTE — Progress Notes (Signed)
Per RN patient is combative; holding off on EEG per Dr. Gwenlyn Perking.

## 2021-10-17 NOTE — ED Notes (Signed)
Pt calm and resting comfortably at this time. Trailed removing bilateral leg restraints

## 2021-10-17 NOTE — ED Notes (Signed)
Pt cleansed repositioned and purwick in place

## 2021-10-17 NOTE — ED Notes (Signed)
Pt repositioned and cleansed depends applied.
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
## 2021-10-17 NOTE — ED Notes (Signed)
Pt repositioned and depends changed purwick placed in correct place

## 2021-10-17 NOTE — ED Provider Notes (Signed)
[facility] EMERGENCY DEPARTMENT Provider Note   CSN: [id number] Arrival date & time: 10/17/21  4098     History  Chief Complaint  Patient presents with   Altered Mental Status    AHMIYA ABEE is a 66 y.o. female.  HPI     Home Medications Prior to Admission medications   Medication Sig Start Date End Date Taking? Authorizing Provider  insulin NPH-regular Human (NOVOLIN 70/30) (70-30) 100 UNIT/ML injection Inject 18 Units into the skin 2 (two) times daily with a meal. 04/26/20  Yes Tat, Onalee Hua, MD  levETIRAcetam (KEPPRA) 750 MG tablet Take 1 tablet (750 mg total) by mouth 2 (two) times daily. Patient taking differently: Take 1,000 mg by mouth every 12 (twelve) hours. 02/08/19  Yes Calvert Cantor, MD  metFORMIN (GLUCOPHAGE) 1000 MG tablet Take 1,000 mg by mouth 2 (two) times daily with a meal.   Yes [provider]  thiamine 100 MG tablet Take 100 mg by mouth daily.   Yes [provider]  amLODipine (NORVASC) 10 MG tablet Take 10 mg by mouth daily. Patient not taking: Reported on 10/17/2021    [provider]  doxycycline (VIBRAMYCIN) 100 MG capsule Take 1 capsule (100 mg total) by mouth 2 (two) times daily. One po bid x 7 days Patient not taking: Reported on 10/17/2021 05/06/21   Dione Booze, MD  glipiZIDE (GLUCOTROL) 10 MG tablet Take 10 mg by mouth daily. Patient not taking: Reported on 10/17/2021 08/21/20   [provider]  Insulin Syringe-Needle U-100 (INSULIN SYRINGE 1CC/30GX1/2") 30G X 1/2" 1 ML MISC Use to dispense insulin as directed 04/26/20   Tat, Onalee Hua, MD  levothyroxine (SYNTHROID) 75 MCG tablet Take 75 mcg by mouth daily. 11/26/20   [provider]  lidocaine (XYLOCAINE) 2 % solution Use as directed 15 mLs in the mouth or throat as needed for mouth pain. Patient not taking: Reported on 10/17/2021 01/30/21   Caccavale, Sophia, PA-C  lisinopril (ZESTRIL) 10 MG tablet Take 1 tablet by mouth daily. Patient not taking: Reported on  10/17/2021 09/03/20   [provider]  magnesium oxide (MAG-OX) 400 (241.3 Mg) MG tablet Take 1 tablet (400 mg total) by mouth daily. Patient not taking: Reported on 10/17/2021 04/26/20   Catarina Hartshorn, MD  metoprolol tartrate (LOPRESSOR) 50 MG tablet Take 1 tablet (50 mg total) by mouth 2 (two) times daily. Patient not taking: Reported on 10/17/2021 02/08/19   Calvert Cantor, MD      Allergies    Patient has no known allergies.     ED Results / Procedures / Treatments   Labs (all labs ordered are listed, but only abnormal results are displayed) Labs Reviewed  COMPREHENSIVE METABOLIC PANEL - Abnormal; Notable for the following components:      Result Value   Sodium 133 (*)    Potassium 3.2 (*)    CO2 16 (*)    Glucose, Bld 676 (*)    Creatinine, Ser 1.03 (*)    AST 54 (*)    ALT 68 (*)    GFR, Estimated 60 (*)    Anion gap 18 (*)    All other components within normal limits  CBC WITH DIFFERENTIAL/PLATELET - Abnormal; Notable for the following components:   Platelets 84 (*)    All other components within normal limits  URINALYSIS, ROUTINE W REFLEX MICROSCOPIC - Abnormal; Notable for the following components:   Glucose, UA >=500 (*)    Hgb urine dipstick MODERATE (*)    Ketones,  ur 20 (*)    Protein, ur 100 (*)    Nitrite POSITIVE (*)    All other components within normal limits  RAPID URINE DRUG SCREEN, HOSP PERFORMED - Abnormal; Notable for the following components:   Benzodiazepines POSITIVE (*)    All other components within normal limits  BETA-HYDROXYBUTYRIC ACID - Abnormal; Notable for the following components:   Beta-Hydroxybutyric Acid 0.57 (*)    All other components within normal limits  BLOOD GAS, VENOUS - Abnormal; Notable for the following components:   pCO2, Ven 40.6 (*)    pO2, Ven 50.8 (*)    Bicarbonate 19.8 (*)    Acid-base deficit 5.0 (*)    All other components within normal limits  BASIC METABOLIC PANEL - Abnormal; Notable for the following components:    Potassium 3.1 (*)    CO2 19 (*)    Glucose, Bld 519 (*)    Anion gap 17 (*)    All other components within normal limits  BETA-HYDROXYBUTYRIC ACID - Abnormal; Notable for the following components:   Beta-Hydroxybutyric Acid 2.71 (*)    All other components within normal limits  CBG MONITORING, ED - Abnormal; Notable for the following components:   Glucose-Capillary >600 (*)    All other components within normal limits  CBG MONITORING, ED - Abnormal; Notable for the following components:   Glucose-Capillary 569 (*)    All other components within normal limits  CBG MONITORING, ED - Abnormal; Notable for the following components:   Glucose-Capillary 544 (*)    All other components within normal limits  CBG MONITORING, ED - Abnormal; Notable for the following components:   Glucose-Capillary 566 (*)    All other components within normal limits  CBG MONITORING, ED - Abnormal; Notable for the following components:   Glucose-Capillary 475 (*)    All other components within normal limits  CBG MONITORING, ED - Abnormal; Notable for the following components:   Glucose-Capillary 437 (*)    All other components within normal limits  CBG MONITORING, ED - Abnormal; Notable for the following components:   Glucose-Capillary 357 (*)    All other components within normal limits  CBG MONITORING, ED - Abnormal; Notable for the following components:   Glucose-Capillary 332 (*)    All other components within normal limits  RESP PANEL BY RT-PCR (FLU A&B, COVID) ARPGX2  ETHANOL  MAGNESIUM  PHOSPHORUS  TSH  VITAMIN B12  AMMONIA  HIV ANTIBODY (ROUTINE TESTING W REFLEX)  BASIC METABOLIC PANEL  BASIC METABOLIC PANEL  BASIC METABOLIC PANEL  BETA-HYDROXYBUTYRIC ACID  HEMOGLOBIN A1C  TROPONIN I (HIGH SENSITIVITY)    EKG EKG Interpretation  Date/Time:  Friday October 17 2021 07:22:52 EST Ventricular Rate:  132 PR Interval:  74 QRS Duration: 71 QT Interval:  317 QTC Calculation: 470 R  Axis:   30 Text Interpretation: Sinus tachycardia Borderline repolarization abnormality Confirmed by Alvester Chou [id number]) on 10/17/2021 7:27:13 AM  Radiology CT Head Wo Contrast  Result Date: 10/17/2021 CLINICAL DATA:  Altered mental status.  Possible fall. EXAM: CT HEAD WITHOUT CONTRAST TECHNIQUE: Contiguous axial images were obtained from the base of the skull through the vertex without intravenous contrast. COMPARISON:  CT head dated November 22, 2020. FINDINGS: Brain: No evidence of acute infarction, hemorrhage, hydrocephalus, extra-axial collection or mass lesion/mass effect. Stable mild atrophy and chronic microvascular ischemic changes. 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 Vascular: Atherosclerotic vascular calcification of the carotid siphons. No hyperdense vessel. Skull: Normal. Negative for fracture or focal  lesion. Sinuses/Orbits: No acute finding. Other: None. IMPRESSION: 1. No acute intracranial abnormality. Electronically Signed   By: Obie DredgeWilliam T Derry M.D.   On: 10/17/2021 08:13    Procedures .Critical Care Performed by: Terald Sleeperrifan, Durell Lofaso J, MD Authorized by: Terald Sleeperrifan, Dot Splinter J, MD   Critical care provider statement:    Critical care time (minutes):  45   Critical care time was exclusive of:  Separately billable procedures and treating other patients   Critical care was necessary to treat or prevent imminent or life-threatening deterioration of the following conditions:  CNS failure or compromise   Critical care was time spent personally by me on the following activities:  Ordering and performing treatments and interventions, ordering and review of laboratory studies, ordering and review of radiographic studies, pulse oximetry, review of old charts, examination of patient and evaluation of patient's response to treatment   Care discussed with: admitting provider   Comments:     Frequent reassessment for mental status function, IV sedation medications, respiratory status  evaluation    Medications Ordered in ED Medications  insulin regular, human (MYXREDLIN) 100 units/ 100 mL infusion (5.5 Units/hr Intravenous Rate/Dose Change 10/17/21 1256)  lactated ringers infusion ( Intravenous New Bag/Given 10/17/21 1013)  dextrose 5 % in lactated ringers infusion (0 mLs Intravenous Hold 10/17/21 1023)  dextrose 50 % solution 0-50 mL (has no administration in time range)  enoxaparin (LOVENOX) injection 40 mg (40 mg Subcutaneous Given 10/17/21 1220)  acetaminophen (TYLENOL) tablet 650 mg (has no administration in time range)    Or  acetaminophen (TYLENOL) suppository 650 mg (has no administration in time range)  ondansetron (ZOFRAN) tablet 4 mg (has no administration in time range)    Or  ondansetron (ZOFRAN) injection 4 mg (has no administration in time range)  metoprolol tartrate (LOPRESSOR) injection 5 mg (5 mg Intravenous Given 10/17/21 1432)  levETIRAcetam (KEPPRA) 750 mg in sodium chloride 0.9 % 100 mL IVPB (has no administration in time range)  levothyroxine (SYNTHROID, LEVOTHROID) injection 37.5 mcg (has no administration in time range)  insulin regular, human (MYXREDLIN) 100 units/ 100 mL infusion ( Intravenous Not Given 10/17/21 1151)  LORazepam (ATIVAN) tablet 1-4 mg ( Oral See Alternative 10/17/21 1431)    Or  LORazepam (ATIVAN) injection 1-4 mg (2 mg Intravenous Given 10/17/21 1431)  thiamine tablet 100 mg ( Oral See Alternative 10/17/21 1220)    Or  thiamine (B-1) injection 100 mg (100 mg Intravenous Given 10/17/21 1220)  folic acid (FOLVITE) tablet 1 mg (1 mg Oral Not Given 10/17/21 1220)  multivitamin with minerals tablet 1 tablet (1 tablet Oral Not Given 10/17/21 1151)  LORazepam (ATIVAN) injection 0-4 mg (2 mg Intravenous Given 10/17/21 1219)    Followed by  LORazepam (ATIVAN) injection 0-4 mg (has no administration in time range)  midazolam (VERSED) 5 MG/5ML injection 5 mg (5 mg Intravenous Given 10/17/21 0716)  haloperidol lactate (HALDOL) injection 2 mg (2 mg Intravenous  Given 10/17/21 0717)  sodium chloride 0.9 % bolus 1,000 mL (0 mLs Intravenous Stopped 10/17/21 0942)  potassium chloride 10 mEq in 100 mL IVPB (0 mEq Intravenous Stopped 10/17/21 1434)  ziprasidone (GEODON) injection 15 mg (15 mg Intramuscular Given 10/17/21 0911)  levETIRAcetam (KEPPRA) IVPB 1500 mg/ 100 mL premix (0 mg Intravenous Stopped 10/17/21 1023)  hydrALAZINE (APRESOLINE) injection 10 mg (10 mg Intravenous Given 10/17/21 1102)  LORazepam (ATIVAN) injection 1 mg (1 mg Intravenous Given 10/17/21 1102)  lactated ringers bolus 1,088 mL (0 mLs Intravenous Stopped 10/17/21 1434)  potassium  chloride 10 mEq in 100 mL IVPB (0 mEq Intravenous Stopped 10/17/21 1434)    ED Course/ Medical Decision Making/ A&P Clinical Course as of 10/17/21 1459  Fri Oct 17, 2021  0725 The patient is EKG per my interpretation shows sinus tachycardia with heart rate of 132, QTC of 470, borderline repolarization abnormalities, no evidence of acute ischemia [MT]  0732 Patient now asleep, I've advised nursing staff about respiratory monitoring protocol given her recent sedation.  We will attempt to complete head CT [MT]  0823 pH, Ven: 7.315 [MT]  0823 No significant metabolic acidosis noted on VBG [MT]  0823 CT scan of the head reviewed and largely unremarkable, no evidence of acute CVA [MT]  0826 Anion gap(!): 18 [MT]  0827 Glucose(!!): 676 [MT]  0837 WBC: 4.3 [MT]  0916 Patient continues to be combative.  She will answer simple questions, but will not comply with the rest of the exam.  She has removed her lines.  I have asked that 50 mg of Geodon be given for additional sedation.  Her husband is now present at the bedside.  He reports the patient is noncompliant with all of her medications at home, including her insulin and her Keppra.  He confirms that she had a witnessed seizure today in the house.  He states she may have been drinking alcohol the past few days, although she has been intermittent with her alcohol use at home this  year. [MT]  1056 Admitted to hospitalist step down unit after discussion regarding care [MT]    Clinical Course User Index [MT] Toini Failla, Kermit Balo, MD                           Medical Decision Making  66 year old female presented to emergency department with altered mental status by EMS.  Supplemental history is provided by EMS, reports the patient's husband found her on the floor today, there were concerns for "multiple seizures" at home.  The patient was combative with EMS.  They reported her blood glucose was read as "high".  No medications were given in route to the hospital.  An IV was placed in the left forearm.  On arrival the patient is screaming, confused, cannot answer any other questions.  EMS provides patient's insulin at bedside novolin 70/30, as well as a prescription bottle for keppra 1000 mg BID.  On exam the patient is tachycardic, she has equal breath sounds bilaterally.  She is awake and screaming, not able to carry on a conversation.  She is moving all extremities spontaneously in the bed.  No evidence of significant head trauma or contusions on my exam.  This patient presents to the Emergency Department with complaint of altered mental status.  This involves an extensive number of treatment options, and is a complaint that carries with it a high risk of complications and morbidity.  The differential diagnosis includes hypoglycemia vs hyperglycemic crisis vs post-ictal confusion vs metabolic encephalopathy vs infection (including cystitis) vs ICH vs polypharmacy vs other  Presentation is most consistent with encephalopathy related to seizures and possible hyperglycemia.  Per my review of the patient's external medical record, she has had prior presentations similar to this.  Reviewed the patient's hospital discharge from July 2021, where she was again admitted for uncontrolled type 2 diabetes, as well as acute metabolic encephalopathy and pan-sensitive UTI at that time.  Upon  arrival today, it was clear that the patient was combative, noncompliant, requiring restraints.  We were not able to speak to her, reason with her, redirect her.  I subsequently ordered the 5 mg of Versed and 2 mg of Haldol be given for the patient's safety and sedation, in order to safely facilitate her work-up in the ER for emergent medical conditions.  I ordered, reviewed, and interpreted labs.  Pertinent positives include elevated beta hydroxy level.  Anion gap of 18.  Potassium of 3.2.  Glucose of 676.  White blood cell count and troponins are unremarkable.  Doubt ACS or sepsis.  Venous gas shows no significant metabolic acidosis.  COVID test is still pending.  Alcohol level is negative. I ordered medication IV Versed, IV Haldol, and Geodon for sedation.  IV fluids, IV potassium, IV insulin for diabetic ketosis.  IV Keppra also ordered as a loading dose given history of noncompliance with AED at home, and inability to take PO meds. I ordered imaging studies which included CT head I independently visualized and interpreted imaging which showed no acute intracranial injury, and the monitor tracing which showed sinus tachycardia Additional history was obtained from the patient's husband Thurmond ButtsWade at bedside, and EMS ona rrival. I personally reviewed the patients ECG which showed sinus rhythm with no acute ischemic findings  Clinical Course as of 10/17/21 1459  Fri Oct 17, 2021  0725 The patient is EKG per my interpretation shows sinus tachycardia with heart rate of 132, QTC of 470, borderline repolarization abnormalities, no evidence of acute ischemia [MT]  0732 Patient now asleep, I've advised nursing staff about respiratory monitoring protocol given her recent sedation.  We will attempt to complete head CT [MT]  0823 pH, Ven: 7.315 [MT]  0823 No significant metabolic acidosis noted on VBG [MT]  0823 CT scan of the head reviewed and largely unremarkable, no evidence of acute CVA [MT]  0826 Anion  gap(!): 18 [MT]  0827 Glucose(!!): 676 [MT]  0837 WBC: 4.3 [MT]  0916 Patient continues to be combative.  She will answer simple questions, but will not comply with the rest of the exam.  She has removed her lines.  I have asked that 50 mg of Geodon be given for additional sedation.  Her husband is now present at the bedside.  He reports the patient is noncompliant with all of her medications at home, including her insulin and her Keppra.  He confirms that she had a witnessed seizure today in the house.  He states she may have been drinking alcohol the past few days, although she has been intermittent with her alcohol use at home this year. [MT]  1056 Admitted to hospitalist step down unit after discussion regarding care [MT]    Clinical Course User Index [MT] Alethea Terhaar, Kermit BaloMatthew J, MD            Final Clinical Impression(s) / ED Diagnoses Final diagnoses:  Altered mental status, unspecified altered mental status type    Rx / DC Orders ED Discharge Orders     None         Nasha Diss, Kermit BaloMatthew J, MD 10/17/21 1459

## 2021-10-17 NOTE — ED Notes (Signed)
Pt has become more combative and kicked this nurse.  Provider immediately called to bedside for more sedation to keep pt safe.  Pt has pulled off all her cardiac leads and clothing's.  Purwick in place with a diaper pt changed.  Attempted an IV but due to pt being combative this nurse requested additional medication and assistance.  Awaiting medication to assist with keeping pt safe.

## 2021-10-17 NOTE — Progress Notes (Signed)
Inpatient Diabetes Program Recommendations  AACE/ADA: New Consensus Statement on Inpatient Glycemic Control   Target Ranges:  Prepandial:   less than 140 mg/dL      Peak postprandial:   less than 180 mg/dL (1-2 hours)      Critically ill patients:  140 - 180 mg/dL    Latest Reference Range & Units 10/17/21 07:28 10/17/21 08:55  Glucose-Capillary 70 - 99 mg/dL >102 (HH) 585 (HH)    Latest Reference Range & Units 10/17/21 07:20  CO2 22 - 32 mmol/L 16 (L)  Glucose 70 - 99 mg/dL 277 (HH)  BUN 8 - 23 mg/dL 16  Creatinine 8.24 - 2.35 mg/dL 3.61 (H)  Calcium 8.9 - 10.3 mg/dL 9.1  Anion gap 5 - 15  18 (H)    Latest Reference Range & Units 10/17/21 07:20  Beta-Hydroxybutyric Acid 0.05 - 0.27 mmol/L 0.57 (H)   Review of Glycemic Control  Diabetes history: DM2 Outpatient Diabetes medications: 70/30 18 units BID, Metformin 1000 mg BID, Glipizide 10 mg daily Current orders for Inpatient glycemic control: IV insulin  Inpatient Diabetes Program Recommendations:    Insulin: Noted IV insulin was just ordered at 8:29 am today.  NOTE: Agree with current orders. Per chart, IV insulin has not been started yet. Will follow.  Thanks, Orlando Penner, RN, MSN, CDE Diabetes Coordinator Inpatient Diabetes Program [phone] (Team Pager from 8am to 5pm)

## 2021-10-17 NOTE — ED Triage Notes (Signed)
Pt brought to ED via [facility] for altered mental status. Pt combative. EMS called, husband states she fell, pt found in the floor. CBG read high. Pt with IV 20 L forearm, combative, restraints applied, bilateral soft wrist non-violent.

## 2021-10-18 DIAGNOSIS — K219 Gastro-esophageal reflux disease without esophagitis: Secondary | ICD-10-CM

## 2021-10-18 DIAGNOSIS — I169 Hypertensive crisis, unspecified: Secondary | ICD-10-CM

## 2021-10-18 DIAGNOSIS — E039 Hypothyroidism, unspecified: Secondary | ICD-10-CM

## 2021-10-18 DIAGNOSIS — G9341 Metabolic encephalopathy: Secondary | ICD-10-CM

## 2021-10-18 LAB — BETA-HYDROXYBUTYRIC ACID: Beta-Hydroxybutyric Acid: 0.08 mmol/L (ref 0.05–0.27)

## 2021-10-18 LAB — MAGNESIUM: Magnesium: 1.4 mg/dL — ABNORMAL LOW (ref 1.7–2.4)

## 2021-10-18 LAB — BASIC METABOLIC PANEL WITH GFR
Anion gap: 9 (ref 5–15)
Anion gap: 9 (ref 5–15)
BUN: 15 mg/dL (ref 8–23)
BUN: 16 mg/dL (ref 8–23)
CO2: 17 mmol/L — ABNORMAL LOW (ref 22–32)
CO2: 19 mmol/L — ABNORMAL LOW (ref 22–32)
Calcium: 9 mg/dL (ref 8.9–10.3)
Calcium: 9.2 mg/dL (ref 8.9–10.3)
Chloride: 112 mmol/L — ABNORMAL HIGH (ref 98–111)
Chloride: 114 mmol/L — ABNORMAL HIGH (ref 98–111)
Creatinine, Ser: 0.74 mg/dL (ref 0.44–1.00)
Creatinine, Ser: 0.8 mg/dL (ref 0.44–1.00)
GFR, Estimated: >60 mL/min
GFR, Estimated: >60 mL/min
Glucose, Bld: 159 mg/dL — ABNORMAL HIGH (ref 70–99)
Glucose, Bld: 193 mg/dL — ABNORMAL HIGH (ref 70–99)
Potassium: 2.9 mmol/L — ABNORMAL LOW (ref 3.5–5.1)
Potassium: 3 mmol/L — ABNORMAL LOW (ref 3.5–5.1)
Sodium: 140 mmol/L (ref 135–145)
Sodium: 140 mmol/L (ref 135–145)

## 2021-10-18 LAB — GLUCOSE, CAPILLARY
Glucose-Capillary: 114 mg/dL — ABNORMAL HIGH (ref 70–99)
Glucose-Capillary: 132 mg/dL — ABNORMAL HIGH (ref 70–99)
Glucose-Capillary: 136 mg/dL — ABNORMAL HIGH (ref 70–99)
Glucose-Capillary: 142 mg/dL — ABNORMAL HIGH (ref 70–99)
Glucose-Capillary: 142 mg/dL — ABNORMAL HIGH (ref 70–99)
Glucose-Capillary: 149 mg/dL — ABNORMAL HIGH (ref 70–99)
Glucose-Capillary: 164 mg/dL — ABNORMAL HIGH (ref 70–99)
Glucose-Capillary: 168 mg/dL — ABNORMAL HIGH (ref 70–99)
Glucose-Capillary: 171 mg/dL — ABNORMAL HIGH (ref 70–99)
Glucose-Capillary: 178 mg/dL — ABNORMAL HIGH (ref 70–99)
Glucose-Capillary: 185 mg/dL — ABNORMAL HIGH (ref 70–99)
Glucose-Capillary: 187 mg/dL — ABNORMAL HIGH (ref 70–99)
Glucose-Capillary: 189 mg/dL — ABNORMAL HIGH (ref 70–99)
Glucose-Capillary: 192 mg/dL — ABNORMAL HIGH (ref 70–99)
Glucose-Capillary: 196 mg/dL — ABNORMAL HIGH (ref 70–99)
Glucose-Capillary: 247 mg/dL — ABNORMAL HIGH (ref 70–99)

## 2021-10-18 LAB — HEMOGLOBIN A1C
Hgb A1c MFr Bld: 13.7 % — ABNORMAL HIGH (ref 4.8–5.6)
Mean Plasma Glucose: 346 mg/dL

## 2021-10-18 LAB — HIV ANTIBODY (ROUTINE TESTING W REFLEX): HIV Screen 4th Generation wRfx: NONREACTIVE

## 2021-10-18 MED ORDER — INSULIN ASPART 100 UNIT/ML IJ SOLN
0.0000 [IU] | Freq: Four times a day (QID) | INTRAMUSCULAR | Status: DC
  Administered 2021-10-18 – 2021-10-20 (×4): 2 [IU] via SUBCUTANEOUS

## 2021-10-18 MED ORDER — INSULIN ASPART 100 UNIT/ML IJ SOLN: 0.0000 [IU] | Freq: Every day | INTRAMUSCULAR | Status: DC

## 2021-10-18 MED ORDER — POTASSIUM CHLORIDE 10 MEQ/100ML IV SOLN
INTRAVENOUS | Status: AC
  Filled 2021-10-18: qty 100

## 2021-10-18 MED ORDER — POTASSIUM CHLORIDE 10 MEQ/100ML IV SOLN
10.0000 meq | INTRAVENOUS | Status: AC
  Administered 2021-10-18 (×4): 10 meq via INTRAVENOUS
  Filled 2021-10-18 (×3): qty 100

## 2021-10-18 MED ORDER — METOPROLOL TARTRATE 5 MG/5ML IV SOLN
5.0000 mg | Freq: Four times a day (QID) | INTRAVENOUS | Status: DC
  Administered 2021-10-18 – 2021-10-19 (×4): 5 mg via INTRAVENOUS
  Filled 2021-10-18 (×4): qty 5

## 2021-10-18 MED ORDER — METOPROLOL TARTRATE 5 MG/5ML IV SOLN
5.0000 mg | Freq: Once | INTRAVENOUS | Status: AC
  Administered 2021-10-18: 5 mg via INTRAVENOUS
  Filled 2021-10-18: qty 5

## 2021-10-18 MED ORDER — INSULIN DETEMIR 100 UNIT/ML ~~LOC~~ SOLN
8.0000 [IU] | Freq: Two times a day (BID) | SUBCUTANEOUS | Status: DC
  Administered 2021-10-18 – 2021-10-19 (×3): 8 [IU] via SUBCUTANEOUS
  Filled 2021-10-18 (×5): qty 0.08

## 2021-10-18 NOTE — Progress Notes (Signed)
Levemir given at 1444. Insulin gtt to be turned off in 2 hours at 1644. Will continue to monitor.

## 2021-10-18 NOTE — TOC Initial Note (Signed)
Transition of Care [facility]) - Initial/Assessment Note    Patient Details  Name: LILINOE SANTILLI MRN: [id number] Date of Birth: [birth year]  Transition of Care [facility]) CM/SW Contact:    Kerin Salen, RN Phone Number: 10/18/2021, 4:38 PM  Clinical Narrative:  Transition of Care (TOC) Screening Note   Patient Details  Name: SHEKIA FILAN Date of Birth: 01-17-1956   Transition of Care [facility]) CM/SW Contact:    Kerin Salen, RN Phone Number: 10/18/2021, 4:39 PM    Transition of Care Department [facility]) has reviewed patient and no TOC needs have been identified at this time. We will continue to monitor patient advancement through interdisciplinary progression rounds. If new patient transition needs arise, please place a TOC consult.                          Patient Goals and CMS Choice        Expected Discharge Plan and Services                                                Prior Living Arrangements/Services                       Activities of Daily Living      Permission Sought/Granted                  Emotional Assessment              Admission diagnosis:  DKA (diabetic ketoacidosis) ([place]) [E11.10] Altered mental status, unspecified altered mental status type [R41.82] Patient Active Problem List   Diagnosis Date Noted   DKA (diabetic ketoacidosis) ([place]) 10/17/2021   Acute cystitis without hematuria    Seizure disorder ([place]) 04/23/2020   Hyperosmolar non-ketotic state due to type 2 diabetes mellitus ([place]) 04/23/2020   Nonketotic hyperglycinemia, type II ([place]) 04/22/2020   SAH (subarachnoid hemorrhage) ([place]) 04/30/2019   DKA, type 2, not at goal [facility]) 02/08/2019   Status epilepticus ([place]) 02/08/2019   Alcohol abuse [id number]   Acute metabolic encephalopathy [id number]   Diabetic acidosis without coma ([place])    H/O ETOH abuse    C. difficile colitis 07/05/2018   Syncope and collapse 07/03/2018   Hyperglycemia 01/22/2018    Essential hypertension 01/22/2018   Alcohol withdrawal syndrome with complication, with unspecified complication ([place]) 0000000   Elevated LFTs 01/22/2018   Lactic acidosis 01/22/2018   Hypothyroidism 01/22/2018   Thrombocytopenia ([place]) 01/22/2018   Hypomagnesemia 01/22/2018   Acute renal injury ([place]) 01/22/2018   PCP:  Donnamarie Rossetti, PA-C Pharmacy:   [place], [place] - [address] [place] [address] Phone: [phone] Fax: [phone]  [facility] [address] [place] [address] Phone: [phone] Fax: [phone]     Social Determinants of Health (SDOH) Interventions    Readmission Risk Interventions No flowsheet data found.

## 2021-10-18 NOTE — Progress Notes (Signed)
Inpatient Diabetes Program Recommendations  AACE/ADA: New Consensus Statement on Inpatient Glycemic Control   Target Ranges:  Prepandial:   less than 140 mg/dL      Peak postprandial:   less than 180 mg/dL (1-2 hours)      Critically ill patients:  140 - 180 mg/dL    Review of Glycemic Control  Latest Reference Range & Units 10/18/21 04:32 10/18/21 05:42 10/18/21 06:57 10/18/21 08:29  Glucose-Capillary 70 - 99 mg/dL 258 (H) 527 (H) 782 (H) 178 (H)   Diabetes history: DM2 Outpatient Diabetes medications: 70/30 18 units BID, Metformin 1000 mg BID, Glipizide 10 mg daily Current orders for Inpatient glycemic control: IV insulin  Inpatient Diabetes Program Recommendations:    At time of transition consider: -  Semglee 20 units -  Novolog 0-15 units Q4 hours  Thanks, Christena Deem RN, MSN, BC-ADM Inpatient Diabetes Coordinator Team Pager [phone] (8a-5p)

## 2021-10-18 NOTE — Progress Notes (Signed)
PROGRESS NOTE    CHESA Herrera  WNU:[id number] DOB: [birth year] DOA: 10/17/2021 PCP: Wilford Corner, PA-C    Chief Complaint  Patient presents with   Altered Mental Status    Brief Narrative:  Shannon Herrera is a 66 y.o. female with medical history significant of seizure disorders, uncontrolled type 2 diabetes mellitus, hypothyroidism, hypertension, hyperlipidemia and history of alcohol abuse; presented to the hospital through EMS after patient experience 2 episodes of weakness seizure by her husband at home.  Apparently patient ended up falling; no head injury.  No fever, no chest pain, no nausea, no vomiting, no dysuria, no hematuria or any other complaints.  Patient unable to participate with examination and she was essentially restless, combative and agitated with concerns for postictal state.  At time of my admission evaluation she in the requiring to be in four-point restraints; information retrieved from my discussion with EDP and EMS notes.   Patient is vaccinated against COVID; COVID PCR negative.   ED Course: CT head without acute intracranial normalities, hypertensive urgency, DKA and concern for seizure.  Assessment & Plan: 1-DKA -In the setting of infection and medication noncompliance -A1c 13.7, demonstrating poor control at baseline. -Beta-hydroxybutyric acid down to 0.08 and ready to transition off insulin drip as per Endo tool protocol. -Patient will be started on Levemir twice daily and a sliding scale insulin -Close monitoring of her CBGs we will follow as she is currently not tolerating p.o.'s due to encephalopathic process. -Further adjustment to hypoglycemia regimen to be done based on CBGs fluctuation.  2-acute encephalopathic process -Multifactorial: In the setting of electrolyte impairment, hypertensive crisis, DKA, postictal after seizure and UTI -Continue IV antiepileptic drugs and follow EEG -Continue antibiotics -Continue IV fluids, electrolyte  repletion and further control of sugar levels.  3-hypertensive crisis -Stabilized currently -Continue IV metoprolol and as needed hydralazine  4-hypothyroidism -Continue Synthroid.  5-GERD/GI prophylaxis -Continue PPI.  6-pseudohyponatremia and hypokalemia -In the setting of hyperglycemia and decreased oral intake -Continue to follow electrolytes and further replete as needed. -will check Mg level.  7-seizure disorder -will continue Keppra  8-alcohol abuse -with concerns for withdrawal -continue CIWA protocol -continue thiamine and folic acid  9-acute lower UTI -continue PRN antipyretics -continue current empiric antibiotics  -follow culture results and sensitivity    DVT prophylaxis: Lovenox Code Status: Full code Family Communication: No family at bedside. Disposition:   Status is: Inpatient    Consultants:  None   Procedures: See below for x-ray reports.  Antimicrobials:  Rocephin  Subjective: Restless, agitated and combative overnight requiring Precedex initiation.  Currently not following command, significant somnolence/obtunded state appreciated.  No requiring restrictions.  Minimally responding to painful stimuli. Patient is spiked fever (T-max 103).  Objective: Vitals:   10/18/21 1030 10/18/21 1100 10/18/21 1130 10/18/21 1200  BP: 134/72  (!) 150/76 (!) 155/88  Pulse: (!) 110 (!) 109 (!) 117 (!) 120  Resp: (!) 21 (!) 23 (!) 23 (!) 22  Temp:  (!) 103 F (39.4 C)    TempSrc:  Axillary    SpO2: 97% 97% 98% 97%  Weight:      Height:        Intake/Output Summary (Last 24 hours) at 10/18/2021 1239 Last data filed at 10/18/2021 1204 Gross per 24 hour  Intake 4875.04 ml  Output --  Net 4875.04 ml   Filed Weights   10/17/21 0721 10/17/21 1800  Weight: 54.4 kg 52.2 kg    Examination:  General exam: Obtunded, minimal grimaces  to painful stimuli; nonverbal and not following commands currently.  Patient is off restraints, but required to be  placed overnight on Precedex. Respiratory system: No using accessory muscles, good saturation on room air. Cardiovascular system: Sinus tachycardia, no rubs, no gallops, no JVD on exam. Gastrointestinal system: Abdomen is nondistended, soft and nontender. No organomegaly or masses felt. Normal bowel sounds heard. Central nervous system: Unable to assess due to current encephalopathic process.  No following commands.  Patient moves limbs spontaneously. Extremities: No cyanosis, no clubbing, no edema. Skin: No petechiae. Psychiatry: Judgement and insight appear impaired in the setting of acute encephalopathic process.  Night nursing staff reporting restlessness and agitation.  Data Reviewed: I have personally reviewed following labs and imaging studies  CBC: Recent Labs  Lab 10/17/21 0720  WBC 4.3  NEUTROABS 3.0  HGB 13.3  HCT 37.1  MCV 92.5  PLT 84*    Basic Metabolic Panel: Recent Labs  Lab 10/17/21 0726 10/17/21 1112 10/17/21 1208 10/17/21 1546 10/17/21 1856 10/17/21 2334 10/18/21 0412  NA  --   --  138 142 142 140 140  K  --   --  3.1* 3.3* 3.6 3.0* 2.9*  CL  --   --  102 109 109 112* 114*  CO2  --   --  19* 20* 18* 19* 17*  GLUCOSE  --   --  519* 265* 200* 193* 159*  BUN  --   --  17 17 15 15 16   CREATININE  --   --  0.97 0.84 0.77 0.74 0.80  CALCIUM  --   --  9.3 9.9 9.5 9.2 9.0  MG 1.9  --   --   --   --   --  1.4*  PHOS  --  3.6  --   --   --   --   --     GFR: Estimated Creatinine Clearance: 57 mL/min (by C-G formula based on SCr of 0.8 mg/dL).  Liver Function Tests: Recent Labs  Lab 10/17/21 0720  AST 54*  ALT 68*  ALKPHOS 79  BILITOT 0.8  PROT 7.3  ALBUMIN 4.0    CBG: Recent Labs  Lab 10/18/21 0657 10/18/21 0829 10/18/21 0929 10/18/21 1045 10/18/21 1202  GLUCAP 247* 178* 171* 132* 142*    Recent Results (from the past 240 hour(s))  Resp Panel by RT-PCR (Flu A&B, Covid) Nasopharyngeal Swab     Status: None   Collection Time: 10/17/21   8:12 AM   Specimen: Nasopharyngeal Swab; Nasopharyngeal(NP) swabs in vial transport medium  Result Value Ref Range Status   SARS Coronavirus 2 by RT PCR NEGATIVE NEGATIVE Final    Comment: (NOTE) SARS-CoV-2 target nucleic acids are NOT DETECTED.  The SARS-CoV-2 RNA is generally detectable in upper respiratory specimens during the acute phase of infection. The lowest concentration of SARS-CoV-2 viral copies this assay can detect is 138 copies/mL. A negative result does not preclude SARS-Cov-2 infection and should not be used as the sole basis for treatment or other patient management decisions. A negative result may occur with  improper specimen collection/handling, submission of specimen other than nasopharyngeal swab, presence of viral mutation(s) within the areas targeted by this assay, and inadequate number of viral copies(<138 copies/mL). A negative result must be combined with clinical observations, patient history, and epidemiological information. The expected result is Negative.  Fact Sheet for Patients:  BloggerCourse.com  Fact Sheet for Healthcare Providers:  SeriousBroker.it  This test is no t yet approved or cleared  by the [place] and  has been authorized for detection and/or diagnosis of SARS-CoV-2 by FDA under an Emergency Use Authorization (EUA). This EUA will remain  in effect (meaning this test can be used) for the duration of the COVID-19 declaration under Section 564(b)(1) of the Act, 21 U.S.C.section 360bbb-3(b)(1), unless the authorization is terminated  or revoked sooner.       Influenza A by PCR NEGATIVE NEGATIVE Final   Influenza B by PCR NEGATIVE NEGATIVE Final    Comment: (NOTE) The Xpert Xpress SARS-CoV-2/FLU/RSV plus assay is intended as an aid in the diagnosis of influenza from Nasopharyngeal swab specimens and should not be used as a sole basis for treatment. Nasal washings and aspirates  are unacceptable for Xpert Xpress SARS-CoV-2/FLU/RSV testing.  Fact Sheet for Patients: BloggerCourse.com  Fact Sheet for Healthcare Providers: SeriousBroker.it  This test is not yet approved or cleared by the [place] FDA and has been authorized for detection and/or diagnosis of SARS-CoV-2 by FDA under an Emergency Use Authorization (EUA). This EUA will remain in effect (meaning this test can be used) for the duration of the COVID-19 declaration under Section 564(b)(1) of the Act, 21 U.S.C. section 360bbb-3(b)(1), unless the authorization is terminated or revoked.  Performed at [facility], [address] [place], [place] [address]   MRSA Next Gen by PCR, Nasal     Status: None   Collection Time: 10/17/21  6:28 PM   Specimen: Nasal Mucosa; Nasal Swab  Result Value Ref Range Status   MRSA by PCR Next Gen NOT DETECTED NOT DETECTED Final    Comment: (NOTE) The GeneXpert MRSA Assay (FDA approved for NASAL specimens only), is one component of a comprehensive MRSA colonization surveillance program. It is not intended to diagnose MRSA infection nor to guide or monitor treatment for MRSA infections. Test performance is not FDA approved in patients less than 9 years old. Performed at [facility], [address] [place], [place] [address]      Radiology Studies: CT Head Wo Contrast  Result Date: 10/17/2021 CLINICAL DATA:  Altered mental status.  Possible fall. EXAM: CT HEAD WITHOUT CONTRAST TECHNIQUE: Contiguous axial images were obtained from the base of the skull through the vertex without intravenous contrast. COMPARISON:  CT head dated November 22, 2020. FINDINGS: Brain: No evidence of acute infarction, hemorrhage, hydrocephalus, extra-axial collection or mass lesion/mass effect. Stable mild atrophy and chronic microvascular ischemic changes. 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 Vascular: Atherosclerotic vascular  calcification of the carotid siphons. No hyperdense vessel. Skull: Normal. Negative for fracture or focal lesion. Sinuses/Orbits: No acute finding. Other: None. IMPRESSION: 1. No acute intracranial abnormality. Electronically Signed   By: Obie Dredge M.D.   On: 10/17/2021 08:13     Scheduled Meds:  Chlorhexidine Gluconate Cloth  6 each Topical Q0600   enoxaparin (LOVENOX) injection  40 mg Subcutaneous Q24H   folic acid  1 mg Oral Daily   insulin aspart  0-5 Units Subcutaneous QHS   insulin aspart  0-9 Units Subcutaneous Q6H   insulin detemir  8 Units Subcutaneous BID   [START ON 10/20/2021] levothyroxine  37.5 mcg Intravenous Daily   LORazepam  0-4 mg Intravenous Q6H   Followed by   Melene Muller ON 10/19/2021] LORazepam  0-4 mg Intravenous Q12H   metoprolol tartrate  5 mg Intravenous Q8H   multivitamin with minerals  1 tablet Oral Daily   thiamine  100 mg Oral Daily   Or   thiamine  100 mg  Intravenous Daily   Continuous Infusions:  cefTRIAXone (ROCEPHIN)  IV Stopped (10/17/21 2057)   dexmedetomidine (PRECEDEX) IV infusion Stopped (10/18/21 0929)   lactated ringers Stopped (10/17/21 1603)   levETIRAcetam Stopped (10/18/21 0950)     LOS: 1 day    CRITICAL CARE Performed by: Vassie Loll MD   Total critical care time: 55 minutes  Critical care time was exclusive of separately billable procedures and treating other patients.  Critical care was necessary to treat or prevent imminent or life-threatening deterioration.  Critical care was time spent personally by me on the following activities: development of treatment plan with patient and/or surrogate as well as nursing, discussions with consultants, evaluation of patient's response to treatment, examination of patient, obtaining history from patient or surrogate, ordering and performing treatments and interventions, ordering and review of laboratory studies, ordering and review of radiographic studies, pulse oximetry and re-evaluation  of patient's condition.   To contact the attending provider between 7A-7P or the covering provider during after hours 7P-7A, please log into the web site [email or web address] and access using universal [place] password for that web site. If you do not have the password, please call the hospital operator.  10/18/2021, 12:39 PM

## 2021-10-19 LAB — BASIC METABOLIC PANEL
Anion gap: 10 (ref 5–15)
BUN: 21 mg/dL (ref 8–23)
CO2: 18 mmol/L — ABNORMAL LOW (ref 22–32)
Calcium: 8.8 mg/dL — ABNORMAL LOW (ref 8.9–10.3)
Chloride: 115 mmol/L — ABNORMAL HIGH (ref 98–111)
Creatinine, Ser: 0.98 mg/dL (ref 0.44–1.00)
GFR, Estimated: >60 mL/min (ref >60)
Glucose, Bld: 88 mg/dL (ref 70–99)
Potassium: 3.1 mmol/L — ABNORMAL LOW (ref 3.5–5.1)
Sodium: 143 mmol/L (ref 135–145)

## 2021-10-19 LAB — CBC
HCT: 43.6 % (ref 36.0–46.0)
Hemoglobin: 14.4 g/dL (ref 12.0–15.0)
MCH: 31.6 pg (ref 26.0–34.0)
MCHC: 33 g/dL (ref 30.0–36.0)
MCV: 95.6 fL (ref 80.0–100.0)
Platelets: 105 10*3/uL — ABNORMAL LOW (ref 150–400)
RBC: 4.56 MIL/uL (ref 3.87–5.11)
RDW: 13.2 % (ref 11.5–15.5)
WBC: 13.6 10*3/uL — ABNORMAL HIGH (ref 4.0–10.5)
nRBC: 0 % (ref 0.0–0.2)

## 2021-10-19 LAB — GLUCOSE, CAPILLARY
Glucose-Capillary: 111 mg/dL — ABNORMAL HIGH (ref 70–99)
Glucose-Capillary: 138 mg/dL — ABNORMAL HIGH (ref 70–99)
Glucose-Capillary: 159 mg/dL — ABNORMAL HIGH (ref 70–99)
Glucose-Capillary: 186 mg/dL — ABNORMAL HIGH (ref 70–99)
Glucose-Capillary: 62 mg/dL — ABNORMAL LOW (ref 70–99)
Glucose-Capillary: 67 mg/dL — ABNORMAL LOW (ref 70–99)
Glucose-Capillary: 72 mg/dL (ref 70–99)
Glucose-Capillary: 87 mg/dL (ref 70–99)

## 2021-10-19 LAB — MAGNESIUM: Magnesium: 1.4 mg/dL — ABNORMAL LOW (ref 1.7–2.4)

## 2021-10-19 MED ORDER — LEVETIRACETAM 500 MG PO TABS
750.0000 mg | ORAL_TABLET | Freq: Two times a day (BID) | ORAL | Status: DC
  Administered 2021-10-19 – 2021-10-21 (×4): 750 mg via ORAL
  Filled 2021-10-19 (×4): qty 1

## 2021-10-19 MED ORDER — LEVOTHYROXINE SODIUM 75 MCG PO TABS
75.0000 ug | ORAL_TABLET | Freq: Every day | ORAL | Status: DC
  Administered 2021-10-20 – 2021-10-21 (×2): 75 ug via ORAL
  Filled 2021-10-19 (×2): qty 1

## 2021-10-19 MED ORDER — DM-GUAIFENESIN ER 30-600 MG PO TB12
1.0000 | ORAL_TABLET | Freq: Three times a day (TID) | ORAL | Status: DC | PRN
  Administered 2021-10-19 – 2021-10-20 (×2): 1 via ORAL
  Filled 2021-10-19 (×2): qty 1

## 2021-10-19 MED ORDER — INSULIN DETEMIR 100 UNIT/ML ~~LOC~~ SOLN
7.0000 [IU] | Freq: Two times a day (BID) | SUBCUTANEOUS | Status: DC
  Administered 2021-10-19 – 2021-10-20 (×3): 7 [IU] via SUBCUTANEOUS
  Filled 2021-10-19 (×7): qty 0.07

## 2021-10-19 MED ORDER — METOPROLOL TARTRATE 25 MG PO TABS
25.0000 mg | ORAL_TABLET | Freq: Two times a day (BID) | ORAL | Status: DC
  Administered 2021-10-19 – 2021-10-21 (×4): 25 mg via ORAL
  Filled 2021-10-19 (×4): qty 1

## 2021-10-19 MED ORDER — METOPROLOL TARTRATE 5 MG/5ML IV SOLN: 5.0000 mg | Freq: Three times a day (TID) | INTRAVENOUS | Status: DC | PRN

## 2021-10-19 NOTE — Progress Notes (Signed)
PROGRESS NOTE    Shannon Herrera  DGL:[id number] DOB: [birth year] DOA: 10/17/2021 PCP: Wilford Corner, PA-C    Chief Complaint  Patient presents with   Altered Mental Status    Brief Narrative:  Shannon Herrera is a 66 y.o. female with medical history significant of seizure disorders, uncontrolled type 2 diabetes mellitus, hypothyroidism, hypertension, hyperlipidemia and history of alcohol abuse; presented to the hospital through EMS after patient experience 2 episodes of weakness seizure by her husband at home.  Apparently patient ended up falling; no head injury.  No fever, no chest pain, no nausea, no vomiting, no dysuria, no hematuria or any other complaints.  Patient unable to participate with examination and she was essentially restless, combative and agitated with concerns for postictal state.  At time of my admission evaluation she in the requiring to be in four-point restraints; information retrieved from my discussion with EDP and EMS notes.   Patient is vaccinated against COVID; COVID PCR negative.   ED Course: CT head without acute intracranial normalities, hypertensive urgency, DKA and concern for seizure.  Assessment & Plan: 1-DKA -In the setting of infection and medication noncompliance -A1c 13.7, demonstrating poor control at baseline. -Patient has been successfully transitioned off insulin drip and so far with a stable CBGs. -Patient continue adjusted dose of Levemir and sliding scale insulin. -Close monitoring of her CBGs with further adjustments to hypoglycemia regimen as needed. -Advance diet as tolerated.  2-acute encephalopathic process -Multifactorial: In the setting of electrolyte impairment, hypertensive crisis, DKA, postictal after seizure and UTI -Transition antiepileptic drugs to oral route. -Continue current antibiotics -Adjust IV fluid fluids, advance diet and continue to follow electrolytes and further replete as needed. -Patient mentation  improving; currently oriented x2.  Able to follow commands.  3-hypertensive crisis -Stabilized currently -Continue IV metoprolol and as needed hydralazine  4-hypothyroidism -Continue Synthroid.  5-GERD/GI prophylaxis -Continue the use of PPI.    6-pseudohyponatremia and hypokalemia -In the setting of hyperglycemia and decreased oral intake -Continue to follow electrolytes trend and further replete as needed.  7-seizure disorder -will continue Keppra; now the patient is alert and awake we will transition to oral route and assess tolerance.  8-alcohol abuse -Cessation counseling provided. -With concerns for withdrawal -continue CIWA protocol -continue thiamine and folic acid  9-acute lower UTI -continue PRN antipyretics -continue current empiric antibiotics  -follow culture results and sensitivity  -Patient reports no dysuria and is currently afebrile.   DVT prophylaxis: Lovenox Code Status: Full code Family Communication: No family at bedside. Disposition:   Status is: Inpatient    Consultants:  None   Procedures: See below for x-ray reports.  Antimicrobials:  Rocephin  Subjective: Afebrile, no chest pain, no nausea or vomiting.  Intermittent nonproductive coughing spells has been reported.  Patient is awake, alert and oriented x2.  No seizure activity has been appreciated.  Objective: Vitals:   10/19/21 1100 10/19/21 1200 10/19/21 1300 10/19/21 1400  BP: (!) 158/78 (!) 152/78 (!) 155/76 (!) 170/86  Pulse: (!) 103 94 91 96  Resp: (!) 22 (!) 22 (!) 23 (!) 33  Temp: 98.5 F (36.9 C)     TempSrc: Oral     SpO2: 97% 99% 97% 96%  Weight:      Height:        Intake/Output Summary (Last 24 hours) at 10/19/2021 1549 Last data filed at 10/19/2021 0900 Gross per 24 hour  Intake 1679.92 ml  Output --  Net 1679.92 ml   [employer]  10/17/21 0721 10/17/21 1800  Weight: 54.4 kg 52.2 kg    Examination: General exam: Alert, awake, oriented x 2, following  commands, no chest pain, no nausea or vomiting reported; feeling weak and tired; appropriate interaction and no major distress.  Positive scattered nonproductive coughing spells appreciated during examination. Respiratory system: No using accessory muscles; good saturation on room air.  Positive scattered rhonchi. Cardiovascular system: Sinus tachycardia, no murmurs, rubs, gallops.  No JVD. Gastrointestinal system: Abdomen is nondistended, soft and nontender. No organomegaly or masses felt. Normal bowel sounds heard. Central nervous system: Alert, awake and following commands; cranial nerves grossly intact.  Generally weak, no focal deficits. Extremities: No cyanosis, clubbing or edema. Skin: No rashes or petechiae. Psychiatry: Appropriate mood currently.  Data Reviewed: I have personally reviewed following labs and imaging studies  CBC: Recent Labs  Lab 10/17/21 0720 10/19/21 0634  WBC 4.3 13.6*  NEUTROABS 3.0  --   HGB 13.3 14.4  HCT 37.1 43.6  MCV 92.5 95.6  PLT 84* 105*    Basic Metabolic Panel: Recent Labs  Lab 10/17/21 0726 10/17/21 1112 10/17/21 1208 10/17/21 1546 10/17/21 1856 10/17/21 2334 10/18/21 0412 10/19/21 0634  NA  --   --    < > 142 142 140 140 143  K  --   --    < > 3.3* 3.6 3.0* 2.9* 3.1*  CL  --   --    < > 109 109 112* 114* 115*  CO2  --   --    < > 20* 18* 19* 17* 18*  GLUCOSE  --   --    < > 265* 200* 193* 159* 88  BUN  --   --    < > 17 15 15 16 21   CREATININE  --   --    < > 0.84 0.77 0.74 0.80 0.98  CALCIUM  --   --    < > 9.9 9.5 9.2 9.0 8.8*  MG 1.9  --   --   --   --   --  1.4* 1.4*  PHOS  --  3.6  --   --   --   --   --   --    < > = values in this interval not displayed.    GFR: Estimated Creatinine Clearance: 46.5 mL/min (by C-G formula based on SCr of 0.98 mg/dL).  Liver Function Tests: Recent Labs  Lab 10/17/21 0720  AST 54*  ALT 68*  ALKPHOS 79  BILITOT 0.8  PROT 7.3  ALBUMIN 4.0    CBG: Recent Labs  Lab  10/18/21 2136 10/19/21 0006 10/19/21 0655 10/19/21 0802 10/19/21 1128  GLUCAP 196* 186* 72 87 159*    Recent Results (from the past 240 hour(s))  Resp Panel by RT-PCR (Flu A&B, Covid) Nasopharyngeal Swab     Status: None   Collection Time: 10/17/21  8:12 AM   Specimen: Nasopharyngeal Swab; Nasopharyngeal(NP) swabs in vial transport medium  Result Value Ref Range Status   SARS Coronavirus 2 by RT PCR NEGATIVE NEGATIVE Final    Comment: (NOTE) SARS-CoV-2 target nucleic acids are NOT DETECTED.  The SARS-CoV-2 RNA is generally detectable in upper respiratory specimens during the acute phase of infection. The lowest concentration of SARS-CoV-2 viral copies this assay can detect is 138 copies/mL. A negative result does not preclude SARS-Cov-2 infection and should not be used as the sole basis for treatment or other patient management decisions. A negative result may occur with  improper specimen collection/handling, submission of specimen other than nasopharyngeal swab, presence of viral mutation(s) within the areas targeted by this assay, and inadequate number of viral copies(<138 copies/mL). A negative result must be combined with clinical observations, patient history, and epidemiological information. The expected result is Negative.  Fact Sheet for Patients:  BloggerCourse.com  Fact Sheet for Healthcare Providers:  SeriousBroker.it  This test is no t yet approved or cleared by the [place] FDA and  has been authorized for detection and/or diagnosis of SARS-CoV-2 by FDA under an Emergency Use Authorization (EUA). This EUA will remain  in effect (meaning this test can be used) for the duration of the COVID-19 declaration under Section 564(b)(1) of the Act, 21 U.S.C.section 360bbb-3(b)(1), unless the authorization is terminated  or revoked sooner.       Influenza A by PCR NEGATIVE NEGATIVE Final   Influenza B by PCR  NEGATIVE NEGATIVE Final    Comment: (NOTE) The Xpert Xpress SARS-CoV-2/FLU/RSV plus assay is intended as an aid in the diagnosis of influenza from Nasopharyngeal swab specimens and should not be used as a sole basis for treatment. Nasal washings and aspirates are unacceptable for Xpert Xpress SARS-CoV-2/FLU/RSV testing.  Fact Sheet for Patients: BloggerCourse.com  Fact Sheet for Healthcare Providers: SeriousBroker.it  This test is not yet approved or cleared by the [place] FDA and has been authorized for detection and/or diagnosis of SARS-CoV-2 by FDA under an Emergency Use Authorization (EUA). This EUA will remain in effect (meaning this test can be used) for the duration of the COVID-19 declaration under Section 564(b)(1) of the Act, 21 U.S.C. section 360bbb-3(b)(1), unless the authorization is terminated or revoked.  Performed at [facility], [address] [place], [place] [address]   MRSA Next Gen by PCR, Nasal     Status: None   Collection Time: 10/17/21  6:28 PM   Specimen: Nasal Mucosa; Nasal Swab  Result Value Ref Range Status   MRSA by PCR Next Gen NOT DETECTED NOT DETECTED Final    Comment: (NOTE) The GeneXpert MRSA Assay (FDA approved for NASAL specimens only), is one component of a comprehensive MRSA colonization surveillance program. It is not intended to diagnose MRSA infection nor to guide or monitor treatment for MRSA infections. Test performance is not FDA approved in patients less than 56 years old. Performed at [facility], [address], [place], [place] [address]      Radiology Studies: No results found.   Scheduled Meds:  Chlorhexidine Gluconate Cloth  6 each Topical Q0600   enoxaparin (LOVENOX) injection  40 mg Subcutaneous Q24H   folic acid  1 mg Oral Daily   insulin aspart  0-5 Units Subcutaneous QHS   insulin aspart  0-9 Units Subcutaneous Q6H   insulin detemir  7 Units Subcutaneous  BID   levETIRAcetam  750 mg Oral BID   [START ON 10/20/2021] levothyroxine  75 mcg Oral Q0600   LORazepam  0-4 mg Intravenous Q12H   metoprolol tartrate  25 mg Oral BID   multivitamin with minerals  1 tablet Oral Daily   thiamine  100 mg Oral Daily   Or   thiamine  100 mg Intravenous Daily   Continuous Infusions:  cefTRIAXone (ROCEPHIN)  IV Stopped (10/18/21 2021)   lactated ringers 75 mL/hr at 10/19/21 1549     LOS: 2 days    To contact the attending provider between 7A-7P or the covering provider during after hours 7P-7A, please log into the web site [email or web address] and access  using universal [place] password for that web site. If you do not have the password, please call the hospital operator.  10/19/2021, 3:49 PM

## 2021-10-19 NOTE — Progress Notes (Signed)
Inpatient Diabetes Program Recommendations  AACE/ADA: New Consensus Statement on Inpatient Glycemic Control   Target Ranges:  Prepandial:   less than 140 mg/dL      Peak postprandial:   less than 180 mg/dL (1-2 hours)      Critically ill patients:  140 - 180 mg/dL    Review of Glycemic Control  Latest Reference Range & Units 10/18/21 17:15 10/18/21 19:55 10/18/21 21:36 10/19/21 00:06 10/19/21 06:55 10/19/21 08:02  Glucose-Capillary 70 - 99 mg/dL 326 (H) 712 (H) 458 (H) 186 (H) 72 87   Diabetes history: DM2 Outpatient Diabetes medications: 70/30 18 units BID, Metformin 1000 mg BID, Glipizide 10 mg daily Current orders for Inpatient glycemic control: Levemir 8 units bid Novolog 0-9 units Q6 hours + hs  Inpatient Diabetes Program Recommendations:    Fasting glucose in the 70's this am, to prevent hypoglycemia   -  Consider reducing Levemir to 6 units bid   Thanks, Christena Deem RN, MSN, BC-ADM Inpatient Diabetes Coordinator Team Pager [phone] (8a-5p)

## 2021-10-20 LAB — BASIC METABOLIC PANEL
Anion gap: 7 (ref 5–15)
BUN: 15 mg/dL (ref 8–23)
CO2: 17 mmol/L — ABNORMAL LOW (ref 22–32)
Calcium: 8.1 mg/dL — ABNORMAL LOW (ref 8.9–10.3)
Chloride: 116 mmol/L — ABNORMAL HIGH (ref 98–111)
Creatinine, Ser: 0.73 mg/dL (ref 0.44–1.00)
GFR, Estimated: >60 mL/min (ref >60)
Glucose, Bld: 89 mg/dL (ref 70–99)
Potassium: 2.9 mmol/L — ABNORMAL LOW (ref 3.5–5.1)
Sodium: 140 mmol/L (ref 135–145)

## 2021-10-20 LAB — GLUCOSE, CAPILLARY
Glucose-Capillary: 84 mg/dL (ref 70–99)
Glucose-Capillary: 153 mg/dL — ABNORMAL HIGH (ref 70–99)
Glucose-Capillary: 108 mg/dL — ABNORMAL HIGH (ref 70–99)
Glucose-Capillary: 115 mg/dL — ABNORMAL HIGH (ref 70–99)
Glucose-Capillary: 134 mg/dL — ABNORMAL HIGH (ref 70–99)
Glucose-Capillary: 95 mg/dL (ref 70–99)

## 2021-10-20 LAB — MAGNESIUM: Magnesium: 1.4 mg/dL — ABNORMAL LOW (ref 1.7–2.4)

## 2021-10-20 MED ORDER — MAGNESIUM SULFATE 2 GM/50ML IV SOLN
2.0000 g | Freq: Once | INTRAVENOUS | Status: AC
  Administered 2021-10-20: 2 g via INTRAVENOUS
  Filled 2021-10-20: qty 50

## 2021-10-20 MED ORDER — INSULIN ASPART 100 UNIT/ML IJ SOLN
0.0000 [IU] | Freq: Three times a day (TID) | INTRAMUSCULAR | Status: DC
  Administered 2021-10-20: 1 [IU] via SUBCUTANEOUS
  Administered 2021-10-21: 3 [IU] via SUBCUTANEOUS

## 2021-10-20 MED ORDER — POTASSIUM CHLORIDE 20 MEQ PO PACK
40.0000 meq | PACK | ORAL | Status: AC
  Administered 2021-10-20 (×2): 40 meq via ORAL
  Filled 2021-10-20 (×2): qty 2

## 2021-10-20 MED ORDER — DEXTROMETHORPHAN POLISTIREX ER 30 MG/5ML PO SUER
30.0000 mg | Freq: Two times a day (BID) | ORAL | Status: DC | PRN
  Administered 2021-10-20: 30 mg via ORAL
  Filled 2021-10-20 (×2): qty 5

## 2021-10-20 MED ORDER — POTASSIUM CHLORIDE 20 MEQ PO PACK
40.0000 meq | PACK | Freq: Once | ORAL | Status: AC
Start: 2021-10-20 — End: 2021-10-20
  Administered 2021-10-20: 40 meq via ORAL
  Filled 2021-10-20: qty 2

## 2021-10-20 MED ORDER — BENZONATATE 100 MG PO CAPS
100.0000 mg | ORAL_CAPSULE | Freq: Three times a day (TID) | ORAL | Status: DC
  Administered 2021-10-20 – 2021-10-21 (×2): 100 mg via ORAL
  Filled 2021-10-20 (×3): qty 1

## 2021-10-20 NOTE — Plan of Care (Signed)
°  Problem: Acute Rehab PT Goals(only PT should resolve) Goal: Pt Will Go Supine/Side To Sit Outcome: Progressing Flowsheets (Taken 10/20/2021 1618) Pt will go Supine/Side to Sit:  Independently  with modified independence Goal: Patient Will Transfer Sit To/From Stand Outcome: Progressing Flowsheets (Taken 10/20/2021 1618) Patient will transfer sit to/from stand:  Independently  with modified independence Goal: Pt Will Transfer Bed To Chair/Chair To Bed Outcome: Progressing Flowsheets (Taken 10/20/2021 1618) Pt will Transfer Bed to Chair/Chair to Bed:  with modified independence  Independently Goal: Pt Will Ambulate Outcome: Progressing Flowsheets (Taken 10/20/2021 1618) Pt will Ambulate:  Independently  with modified independence   4:18 PM, 10/20/21 Ocie Bob, MPT Physical Therapist with [facility] [phone] office [phone] mobile phone

## 2021-10-20 NOTE — Plan of Care (Signed)

## 2021-10-20 NOTE — Progress Notes (Addendum)
PROGRESS NOTE    Shannon Herrera  IRJ:[id number] DOB: [birth year] DOA: 10/17/2021 PCP: Wilford Corner, PA-C    Chief Complaint  Patient presents with   Altered Mental Status    Brief Narrative:  Shannon Herrera is a 66 y.o. female with medical history significant of seizure disorders, uncontrolled type 2 diabetes mellitus, hypothyroidism, hypertension, hyperlipidemia and history of alcohol abuse; presented to the hospital through EMS after patient experience 2 episodes of weakness seizure by her husband at home.  Apparently patient ended up falling; no head injury.  No fever, no chest pain, no nausea, no vomiting, no dysuria, no hematuria or any other complaints.  Patient unable to participate with examination and she was essentially restless, combative and agitated with concerns for postictal state.  At time of my admission evaluation she in the requiring to be in four-point restraints; information retrieved from my discussion with EDP and EMS notes.   Patient is vaccinated against COVID; COVID PCR negative.   ED Course: CT head without acute intracranial normalities, hypertensive urgency, DKA and concern for seizure.  Assessment & Plan: 1-DKA -In the setting of infection and medication noncompliance -A1c 13.7, demonstrating poor control at baseline. -Patient has been successfully transitioned off insulin drip and so far with a stable CBGs. -Patient continue adjusted dose of Levemir and sliding scale insulin. -Close monitoring of her CBGs with further adjustments to hypoglycemia regimen as needed. -Advance diet as tolerated.  2-acute encephalopathic process -Multifactorial: In the setting of electrolyte impairment, hypertensive crisis, DKA, postictal after seizure and UTI -Transition antiepileptic drugs to oral route. -Continue current antibiotics -continue Adjusted IV fluid fluids, appetite decreased; but no vomiting or nausea reported.  -Continue to follow electrolytes and  replete as needed. -Patient mentation continue improving; currently oriented x2.  Able to follow commands.  3-hypertensive crisis -Stabilized well controlled currently -Continue adjusted dose of metoprolol -Continue as needed hydralazine. -Asked to follow heart healthy diet.  4-hypothyroidism -Continue Synthroid.  5-GERD/GI prophylaxis -Continue the use of PPI.    6-pseudohyponatremia and hypokalemia -In the setting of hyperglycemia and decreased oral intake -Continue to follow electrolytes trend and further replete as needed.  7-seizure disorder -Continue oral Keppra  8-alcohol abuse -Cessation counseling provided. -With concerns for withdrawal -continue CIWA protocol -continue thiamine and folic acid -Precedex has been discontinued.  9-acute lower UTI -continue PRN antipyretics -continue current empiric antibiotics  -follow culture results and sensitivity  -Patient reports mild dysuria today and has remained afebrile.  10-dry coughing spells -Continue the use of as needed Delsym and Tessalon.  11-anemia/hypomagnesemia -Continue repletion as needed -Continue telemetry monitoring -Patient encouraged to increase oral intake.   DVT prophylaxis: Lovenox Code Status: Full code Family Communication: Husband updated at bedside on 10/19/2021; no family at bedside today.. Disposition:   Status is: Inpatient    Consultants:  None   Procedures: See below for x-ray reports.  Antimicrobials:  Rocephin  Subjective: No fever, no chest pain, no nausea or vomiting.  Ports no shortness of breath.  Complaining of dry nonproductive coughing spells and dysuria.  Decreased oral intake.  Objective: Vitals:   10/20/21 1000 10/20/21 1100 10/20/21 1106 10/20/21 1154  BP: (!) 148/74 (!) 146/65    Pulse: 94 94 97   Resp: 10 (!) 21 20   Temp:    99.8 F (37.7 C)  TempSrc:    Oral  SpO2: 98% 98% 99%   Weight:      Height:        Intake/Output  Summary (Last 24 hours) at  10/20/2021 1211 Last data filed at 10/20/2021 0500 Gross per 24 hour  Intake 2099.03 ml  Output 501 ml  Net 1598.03 ml   Filed Weights   10/17/21 0721 10/17/21 1800 10/20/21 0400  Weight: 54.4 kg 52.2 kg 54 kg    Examination: General exam: Alert, awake, oriented x 2; following commands appropriately and complaining of dry nonproductive coughing spells.  Patient is afebrile and reporting no dysuria. Respiratory system: Good air movement bilaterally.  Respiratory effort normal.  No requiring oxygen supplementation; positive scattered rhonchi. Cardiovascular system: Intermittent sinus tachycardia; No murmurs, rubs, gallops.  No JVD. Gastrointestinal system: Abdomen is nondistended, soft and nontender. No organomegaly or masses felt. Normal bowel sounds heard. Central nervous system: Patient reports feeling weak and tired. No focal neurological deficits. Extremities: No cyanosis or clubbing. Skin: No petechiae. Psychiatry: Mood and affect appropriate; following commands.  Data Reviewed: I have personally reviewed following labs and imaging studies  CBC: Recent Labs  Lab 10/17/21 0720 10/19/21 0634  WBC 4.3 13.6*  NEUTROABS 3.0  --   HGB 13.3 14.4  HCT 37.1 43.6  MCV 92.5 95.6  PLT 84* 105*    Basic Metabolic Panel: Recent Labs  Lab 10/17/21 0726 10/17/21 1112 10/17/21 1208 10/17/21 1856 10/17/21 2334 10/18/21 0412 10/19/21 0634 10/20/21 0129  NA  --   --    < > 142 140 140 143 140  K  --   --    < > 3.6 3.0* 2.9* 3.1* 2.9*  CL  --   --    < > 109 112* 114* 115* 116*  CO2  --   --    < > 18* 19* 17* 18* 17*  GLUCOSE  --   --    < > 200* 193* 159* 88 89  BUN  --   --    < > 15 15 16 21 15   CREATININE  --   --    < > 0.77 0.74 0.80 0.98 0.73  CALCIUM  --   --    < > 9.5 9.2 9.0 8.8* 8.1*  MG 1.9  --   --   --   --  1.4* 1.4* 1.4*  PHOS  --  3.6  --   --   --   --   --   --    < > = values in this interval not displayed.    GFR: Estimated Creatinine Clearance: 57.2  mL/min (by C-G formula based on SCr of 0.73 mg/dL).  Liver Function Tests: Recent Labs  Lab 10/17/21 0720  AST 54*  ALT 68*  ALKPHOS 79  BILITOT 0.8  PROT 7.3  ALBUMIN 4.0    CBG: Recent Labs  Lab 10/19/21 2152 10/20/21 0148 10/20/21 0625 10/20/21 0756 10/20/21 1135  GLUCAP 111* 84 153* 108* 115*    Recent Results (from the past 240 hour(s))  Resp Panel by RT-PCR (Flu A&B, Covid) Nasopharyngeal Swab     Status: None   Collection Time: 10/17/21  8:12 AM   Specimen: Nasopharyngeal Swab; Nasopharyngeal(NP) swabs in vial transport medium  Result Value Ref Range Status   SARS Coronavirus 2 by RT PCR NEGATIVE NEGATIVE Final    Comment: (NOTE) SARS-CoV-2 target nucleic acids are NOT DETECTED.  The SARS-CoV-2 RNA is generally detectable in upper respiratory specimens during the acute phase of infection. The lowest concentration of SARS-CoV-2 viral copies this assay can detect is 138 copies/mL. A negative result does not preclude  SARS-Cov-2 infection and should not be used as the sole basis for treatment or other patient management decisions. A negative result may occur with  improper specimen collection/handling, submission of specimen other than nasopharyngeal swab, presence of viral mutation(s) within the areas targeted by this assay, and inadequate number of viral copies(<138 copies/mL). A negative result must be combined with clinical observations, patient history, and epidemiological information. The expected result is Negative.  Fact Sheet for Patients:  BloggerCourse.com  Fact Sheet for Healthcare Providers:  SeriousBroker.it  This test is no t yet approved or cleared by the [place] FDA and  has been authorized for detection and/or diagnosis of SARS-CoV-2 by FDA under an Emergency Use Authorization (EUA). This EUA will remain  in effect (meaning this test can be used) for the duration of the COVID-19  declaration under Section 564(b)(1) of the Act, 21 U.S.C.section 360bbb-3(b)(1), unless the authorization is terminated  or revoked sooner.       Influenza A by PCR NEGATIVE NEGATIVE Final   Influenza B by PCR NEGATIVE NEGATIVE Final    Comment: (NOTE) The Xpert Xpress SARS-CoV-2/FLU/RSV plus assay is intended as an aid in the diagnosis of influenza from Nasopharyngeal swab specimens and should not be used as a sole basis for treatment. Nasal washings and aspirates are unacceptable for Xpert Xpress SARS-CoV-2/FLU/RSV testing.  Fact Sheet for Patients: BloggerCourse.com  Fact Sheet for Healthcare Providers: SeriousBroker.it  This test is not yet approved or cleared by the [place] FDA and has been authorized for detection and/or diagnosis of SARS-CoV-2 by FDA under an Emergency Use Authorization (EUA). This EUA will remain in effect (meaning this test can be used) for the duration of the COVID-19 declaration under Section 564(b)(1) of the Act, 21 U.S.C. section 360bbb-3(b)(1), unless the authorization is terminated or revoked.  Performed at [facility], [address] [place], [place] [address]   MRSA Next Gen by PCR, Nasal     Status: None   Collection Time: 10/17/21  6:28 PM   Specimen: Nasal Mucosa; Nasal Swab  Result Value Ref Range Status   MRSA by PCR Next Gen NOT DETECTED NOT DETECTED Final    Comment: (NOTE) The GeneXpert MRSA Assay (FDA approved for NASAL specimens only), is one component of a comprehensive MRSA colonization surveillance program. It is not intended to diagnose MRSA infection nor to guide or monitor treatment for MRSA infections. Test performance is not FDA approved in patients less than 69 years old. Performed at [facility], [address] [place], [place] [address]      Radiology Studies: No results found.   Scheduled Meds:  benzonatate  100 mg Oral TID   Chlorhexidine Gluconate  Cloth  6 each Topical Q0600   enoxaparin (LOVENOX) injection  40 mg Subcutaneous Q24H   folic acid  1 mg Oral Daily   insulin aspart  0-5 Units Subcutaneous QHS   insulin aspart  0-9 Units Subcutaneous TID WC   insulin detemir  7 Units Subcutaneous BID   levETIRAcetam  750 mg Oral BID   levothyroxine  75 mcg Oral Q0600   LORazepam  0-4 mg Intravenous Q12H   metoprolol tartrate  25 mg Oral BID   multivitamin with minerals  1 tablet Oral Daily   potassium chloride  40 mEq Oral Q4H   thiamine  100 mg Oral Daily   Or   thiamine  100 mg Intravenous Daily   Continuous Infusions:  cefTRIAXone (ROCEPHIN)  IV Stopped (10/19/21 2039)   lactated  ringers 75 mL/hr at 10/20/21 0835     LOS: 3 days    To contact the attending provider between 7A-7P or the covering provider during after hours 7P-7A, please log into the web site [email or web address] and access using universal [place] password for that web site. If you do not have the password, please call the hospital operator.  10/20/2021, 12:11 PM

## 2021-10-20 NOTE — Evaluation (Signed)
Physical Therapy Evaluation Patient Details Name: Shannon Herrera MRN: [id number] DOB: 11-23-1955 Today's Date: 10/20/2021  History of Present Illness  Shannon Herrera is a 66 y.o. female with medical history significant of seizure disorders, uncontrolled type 2 diabetes mellitus, hypothyroidism, hypertension, hyperlipidemia and history of alcohol abuse; presented to the hospital through EMS after patient experience 2 episodes of weakness seizure by her husband at home.  Apparently patient ended up falling; no head injury.  No fever, no chest pain, no nausea, no vomiting, no dysuria, no hematuria or any other complaints.  Patient unable to participate with examination and she was essentially restless, combative and agitated with concerns for postictal state.  At time of my admission evaluation she in the requiring to be in four-point restraints; information retrieved from my discussion with EDP and EMS notes.   Clinical Impression  Patient functioning near baseline for functional mobility and gait demonstrating good return for ambulating in room and hallways without loss of balance.  Patient request to push IV pole secondary to fatigue and feeling mild lightheadedness.  Patient tolerated sitting up in chair after therapy - nursing staff aware.  Patient will benefit from continued skilled physical therapy in hospital and recommended venue below to increase strength, balance, endurance for safe ADLs and gait.          Recommendations for follow up therapy are one component of a multi-disciplinary discharge planning process, led by the attending physician.  Recommendations may be updated based on patient status, additional functional criteria and insurance authorization.  Follow Up Recommendations Home health PT    Assistance Recommended at Discharge Set up Supervision/Assistance  Patient can return home with the following  A little help with walking and/or transfers    Equipment Recommendations  None recommended by PT  Recommendations for Other Services       Functional Status Assessment Patient has had a recent decline in their functional status and demonstrates the ability to make significant improvements in function in a reasonable and predictable amount of time.     Precautions / Restrictions Precautions Precautions: Fall Restrictions Weight Bearing Restrictions: No      Mobility  Bed Mobility Overal bed mobility: Modified Independent                  Transfers                        Ambulation/Gait Ambulation/Gait assistance: Supervision Gait Distance (Feet): 150 Feet Assistive device: IV Pole;None Gait Pattern/deviations: Decreased step length - right;Decreased step length - left;Decreased stride length Gait velocity: near normal     General Gait Details: slightly labored unsteady cadence without loss of balance, demonstrates good return for pushing IV pole  Stairs            Wheelchair Mobility    Modified Rankin (Stroke Patients Only)       Balance Overall balance assessment: Mild deficits observed, not formally tested                                           Pertinent Vitals/Pain Pain Assessment: No/denies pain    Home Living Family/patient expects to be discharged to:: Private residence   Available Help at Discharge: Family;Available PRN/intermittently Type of Home: House Home Access: Stairs to enter Entrance Stairs-Rails: Right Entrance Stairs-Number of Steps: 2 Alternate Level Stairs-Number of  Steps: 8 Home Layout: Two level;Bed/bath upstairs Home Equipment: Grab bars - tub/shower      Prior Function Prior Level of Function : Independent/Modified Independent             Mobility Comments: Tourist information centre manager, drives ADLs Comments: Independent     Hand Dominance   Dominant Hand: Right    Extremity/Trunk Assessment   Upper Extremity Assessment Upper Extremity Assessment: Overall  WFL for tasks assessed    Lower Extremity Assessment Lower Extremity Assessment: Generalized weakness    Cervical / Trunk Assessment Cervical / Trunk Assessment: Normal  Communication   Communication: No difficulties  Cognition Arousal/Alertness: Awake/alert Behavior During Therapy: WFL for tasks assessed/performed Overall Cognitive Status: Within Functional Limits for tasks assessed                                          General Comments      Exercises     Assessment/Plan    PT Assessment Patient needs continued PT services  PT Problem List Decreased strength;Decreased activity tolerance;Decreased balance;Decreased mobility       PT Treatment Interventions DME instruction;Gait training;Stair training;Functional mobility training;Therapeutic activities;Therapeutic exercise;Balance training;Patient/family education    PT Goals (Current goals can be found in the Care Plan section)  Acute Rehab PT Goals Patient Stated Goal: return home with family to assist PT Goal Formulation: With patient Time For Goal Achievement: 10/23/21 Potential to Achieve Goals: Good    Frequency Min 2X/week     Co-evaluation               AM-PAC PT "6 Clicks" Mobility  Outcome Measure Help needed turning from your back to your side while in a flat bed without using bedrails?: None Help needed moving from lying on your back to sitting on the side of a flat bed without using bedrails?: None Help needed moving to and from a bed to a chair (including a wheelchair)?: A Little Help needed standing up from a chair using your arms (e.g., wheelchair or bedside chair)?: A Little Help needed to walk in hospital room?: A Little Help needed climbing 3-5 steps with a railing? : A Little 6 Click Score: 20    End of Session   Activity Tolerance: Patient tolerated treatment well;Patient limited by fatigue Patient left: in chair;with call bell/phone within reach Nurse  Communication: Mobility status PT Visit Diagnosis: Unsteadiness on feet (R26.81);Other abnormalities of gait and mobility (R26.89);Muscle weakness (generalized) (M62.81)    Time: 1520-1540 PT Time Calculation (min) (ACUTE ONLY): 20 min   Charges:   PT Evaluation $PT Eval Moderate Complexity: 1 Mod PT Treatments $Therapeutic Activity: 8-22 mins        4:16 PM, 10/20/21 Ocie Bob, MPT Physical Therapist with [facility] 336 [phone] office [phone] mobile phone

## 2021-10-20 NOTE — Progress Notes (Signed)
Patient noted to have a dry cough. Dr Gwenlyn Perking made aware. New orders placed.

## 2021-10-21 DIAGNOSIS — N390 Urinary tract infection, site not specified: Secondary | ICD-10-CM

## 2021-10-21 DIAGNOSIS — I169 Hypertensive crisis, unspecified: Secondary | ICD-10-CM

## 2021-10-21 DIAGNOSIS — E876 Hypokalemia: Secondary | ICD-10-CM

## 2021-10-21 LAB — BASIC METABOLIC PANEL
Anion gap: 7 (ref 5–15)
BUN: 10 mg/dL (ref 8–23)
CO2: 19 mmol/L — ABNORMAL LOW (ref 22–32)
Calcium: 8.6 mg/dL — ABNORMAL LOW (ref 8.9–10.3)
Chloride: 113 mmol/L — ABNORMAL HIGH (ref 98–111)
Creatinine, Ser: 0.7 mg/dL (ref 0.44–1.00)
GFR, Estimated: >60 mL/min (ref >60)
Glucose, Bld: 88 mg/dL (ref 70–99)
Potassium: 3.6 mmol/L (ref 3.5–5.1)
Sodium: 139 mmol/L (ref 135–145)

## 2021-10-21 LAB — GLUCOSE, CAPILLARY
Glucose-Capillary: 89 mg/dL (ref 70–99)
Glucose-Capillary: 237 mg/dL — ABNORMAL HIGH (ref 70–99)

## 2021-10-21 LAB — MAGNESIUM: Magnesium: 1.6 mg/dL — ABNORMAL LOW (ref 1.7–2.4)

## 2021-10-21 MED ORDER — LEVOTHYROXINE SODIUM 75 MCG PO TABS: 75.0000 ug | ORAL_TABLET | Freq: Every day | ORAL | 2 refills | Status: AC

## 2021-10-21 MED ORDER — NICOTINE 14 MG/24HR TD PT24: 14.0000 mg | MEDICATED_PATCH | TRANSDERMAL | 2 refills | Status: AC

## 2021-10-21 MED ORDER — GLYBURIDE-METFORMIN 1.25-250 MG PO TABS: 1.0000 | ORAL_TABLET | Freq: Every day | ORAL | 3 refills | Status: AC

## 2021-10-21 MED ORDER — LEVETIRACETAM 750 MG PO TABS: 750.0000 mg | ORAL_TABLET | Freq: Two times a day (BID) | ORAL | 3 refills | Status: AC

## 2021-10-21 MED ORDER — FOLIC ACID 1 MG PO TABS: 1.0000 mg | ORAL_TABLET | Freq: Every day | ORAL | 1 refills | Status: AC

## 2021-10-21 MED ORDER — METOPROLOL TARTRATE 50 MG PO TABS: 50.0000 mg | ORAL_TABLET | Freq: Two times a day (BID) | ORAL | 2 refills | Status: AC

## 2021-10-21 MED ORDER — BENZONATATE 100 MG PO CAPS: 100.0000 mg | ORAL_CAPSULE | Freq: Three times a day (TID) | ORAL | 0 refills | Status: AC

## 2021-10-21 MED ORDER — CEFDINIR 300 MG PO CAPS
300.0000 mg | ORAL_CAPSULE | Freq: Two times a day (BID) | ORAL | 0 refills | Status: AC
Start: 2021-10-21 — End: 2021-10-26

## 2021-10-21 NOTE — Progress Notes (Addendum)
Inpatient Diabetes Program Recommendations  AACE/ADA: New Consensus Statement on Inpatient Glycemic Control (2015)  Target Ranges:  Prepandial:   less than 140 mg/dL      Peak postprandial:   less than 180 mg/dL (1-2 hours)      Critically ill patients:  140 - 180 mg/dL   Lab Results  Component Value Date   GLUCAP 89 10/21/2021   HGBA1C 13.7 (H) 10/17/2021    Glucose trending well. In agreement.  Attempted to speak with patient and patient's husband. No answer x 3. Secure chat sent to RN to help assist with additional information, coordination and plan for disposition. Following.  Attempted again at 1200 with the help of the RN: Patient hung up the phone and did not respond.   Thanks, Lujean Rave, MSN, RNC-OB Diabetes Coordinator [phone] (8a-5p)

## 2021-10-21 NOTE — Discharge Summary (Signed)
Physician Discharge Summary  Shannon Herrera:[id number] DOB: 05-21-1956 DOA: 10/17/2021  PCP: Wilford Corner, PA-C  Admit date: 10/17/2021 Discharge date: 10/21/2021  Time spent: 35 minutes  Recommendations for Outpatient Follow-up:  Repeat basic metabolic panel to follow electrolytes and renal function Reassess CBGs/A1c with further adjustment to hypoglycemia regimen as needed Reassess blood pressure and adjust antihypertensive regimen as required Fleet Contras patient has follow-up with neurology for further assessment/medication adjustment if needed for seizure disorder. Continue assisting with tobacco and alcohol cessation.  Discharge Diagnoses:  DKA (diabetic ketoacidosis) (HCC) Seizure (HCC) Acute lower UTI Hypertensive crisis Acute metabolic encephalopathy Hypokalemia Hypomagnesemia Alcohol abuse Tobacco abuse GERD Hypothyroidism    Discharge Condition: Stable and improved.  Discharged home with instruction to follow-up with PCP in 10 days.  CODE STATUS: Full code.  Diet recommendation: Carbohydrate diet and heart healthy diet.  Filed Weights   10/17/21 1800 10/20/21 0400 10/20/21 1425  Weight: 52.2 kg 54 kg 56.2 kg    History of present illness:  Shannon Herrera is a 66 y.o. female with medical history significant of seizure disorders, uncontrolled type 2 diabetes mellitus, hypothyroidism, hypertension, hyperlipidemia and history of alcohol abuse; presented to the hospital through EMS after patient experience 2 episodes of weakness seizure by her husband at home.  Apparently patient ended up falling; no head injury.  No fever, no chest pain, no nausea, no vomiting, no dysuria, no hematuria or any other complaints.  Patient unable to participate with examination and she was essentially restless, combative and agitated with concerns for postictal state.  At time of my admission evaluation she in the requiring to be in four-point restraints; information retrieved from  my discussion with EDP and EMS notes.   Patient is vaccinated against COVID; COVID PCR negative.   ED Course: CT head without acute intracranial normalities, hypertensive urgency, DKA and concern for seizure.  Hospital Course:  1-DKA -In the setting of infection and medication noncompliance -A1c 13.7, demonstrating poor control at baseline. -Patient has been successfully transitioned off insulin drip and so far with a stable CBGs. -Resume 70/30 twice a day as per home regimen; patient will be also started on Glucovance once a day. -Continue to follow A1c closely and will recommend Close monitoring of her CBGs with further adjustments to hypoglycemia regimen as needed. -Continue modified carbohydrate diet.   2-acute metabolic encephalopathy -Multifactorial: In the setting of electrolyte impairment, hypertensive crisis, DKA, postictal after seizure and UTI -Mentation improving back to baseline. -Continue treatment with antiepileptic drugs, complete antibiotics maintain adequate hydration and medication compliance with insulin therapy to prevent future uncontrolled hypoglycemic events. -Patient also encouraged to follow healthy diet and continue antihypertensive agents as prescribed. -Alcohol cessation counseling provided.   3-hypertensive crisis -Stabilized and well controlled currently -Continue adjusted dose of metoprolol -Continue to follow heart healthy diet. -Reassess blood pressure at follow-up visit with further adjustment to antihypertensive regimen as required.   4-hypothyroidism -Continue Synthroid. -Normal TSH.   5-GERD/GI prophylaxis -Continue the use of PPI.     6-pseudohyponatremia  -In the setting of hyperglycemia and decreased oral intake -Improved/resolved after IV fluids provided and sugar controled. -Repeat basic metabolic panel follow-up visit to assess electrolytes trend/stability.   7-seizure disorder -Continue oral Keppra -Continue outpatient follow-up  with neurology service.   8-alcohol abuse -Cessation counseling provided. -CIWA protocol has been used during hospitalization; transient use of Precedex required. -Patient declined outpatient resources to help her quitting. -Thiamine and folic acid prescribed.   9-acute lower UTI -continue  PRN antipyretics -continue current empiric antibiotics  -follow culture results and sensitivity  -Patient reports mild dysuria today and has remained afebrile.   10-dry coughing spells -Continue the use of as needed Tessalon. -Patient advised to quit smoking.   11-hypokalemia/hypomagnesemia -Electrolytes have been repleted -Patient oral intake improved -Repeat basic metabolic panel and magnesium level at follow-up visits.  12-tobacco abuse -Cessation counseling provided -Continue nicotine patch  Procedures: See below for x-ray reports.  Consultations: None  Discharge Exam: Vitals:   10/20/21 2100 10/21/21 0457  BP: (!) 155/70 139/66  Pulse: 100 98  Resp: 18 15  Temp: 99.6 F (37.6 C) 98 F (36.7 C)  SpO2: 99% 99%   General exam: Alert, awake, oriented x 3; following commands appropriately and complaining of mild intermittent dry coughing spells.  No nausea, no vomiting, tolerating diet and feeling ready to go home.  Despite feeling weak and tired, patient has declined home health PT as recommended. Respiratory system: Good air movement bilaterally.  Respiratory effort normal.  No requiring oxygen supplementation; positive scattered rhonchi. Cardiovascular system: Intermittent sinus tachycardia; No murmurs, rubs, gallops.  No JVD. Gastrointestinal system: Abdomen is nondistended, soft and nontender. No organomegaly or masses felt. Normal bowel sounds heard. Central nervous system: Patient reports feeling weak and tired. No focal neurological deficits. Extremities: No cyanosis or clubbing. Skin: No petechiae. Psychiatry: Mood and affect appropriate; following commands.  Discharge  Instructions   Discharge Instructions     Diet - low sodium heart healthy   Complete by: As directed    Diet Carb Modified   Complete by: As directed    Discharge instructions   Complete by: As directed    Take medications as prescribed Stop alcohol and tobacco use Maintain adequate hydration Arrange follow-up with PCP in 10 days Follow heart healthy modified carbohydrate diet. Be compliant with your insulin therapy and avoid skipping meals.      Allergies as of 10/21/2021   No Known Allergies      Medication List     STOP taking these medications    amLODipine 10 MG tablet Commonly known as: NORVASC   doxycycline 100 MG capsule Commonly known as: VIBRAMYCIN   glipiZIDE 10 MG tablet Commonly known as: GLUCOTROL   lidocaine 2 % solution Commonly known as: XYLOCAINE   lisinopril 10 MG tablet Commonly known as: ZESTRIL   metFORMIN 1000 MG tablet Commonly known as: GLUCOPHAGE       TAKE these medications    benzonatate 100 MG capsule Commonly known as: TESSALON Take 1 capsule (100 mg total) by mouth 3 (three) times daily.   cefdinir 300 MG capsule Commonly known as: OMNICEF Take 1 capsule (300 mg total) by mouth 2 (two) times daily for 5 days.   folic acid 1 MG tablet Commonly known as: FOLVITE Take 1 tablet (1 mg total) by mouth daily. Start taking on: October 22, 2021   glyBURIDE-metformin 1.25-250 MG tablet Commonly known as: GLUCOVANCE Take 1 tablet by mouth daily with breakfast.   INSULIN SYRINGE 1CC/30GX1/2" 30G X 1/2" 1 ML Misc Use to dispense insulin as directed   levETIRAcetam 750 MG tablet Commonly known as: Keppra Take 1 tablet (750 mg total) by mouth 2 (two) times daily. What changed:  how much to take when to take this   levothyroxine 75 MCG tablet Commonly known as: SYNTHROID Take 1 tablet (75 mcg total) by mouth daily.   magnesium oxide 400 (241.3 Mg) MG tablet Commonly known as: MAG-OX Take 1 tablet (400  mg total) by  mouth daily.   metoprolol tartrate 50 MG tablet Commonly known as: LOPRESSOR Take 1 tablet (50 mg total) by mouth 2 (two) times daily.   nicotine 14 mg/24hr patch Commonly known as: NICODERM CQ - dosed in mg/24 hours Place 1 patch (14 mg total) onto the skin daily.   NovoLIN 70/30 (70-30) 100 UNIT/ML injection Generic drug: insulin NPH-regular Human Inject 18 Units into the skin 2 (two) times daily with a meal.   thiamine 100 MG tablet Take 100 mg by mouth daily.       No Known Allergies  Follow-up Information     Debbra Riding Hestle, PA-C. Schedule an appointment as soon as possible for a visit in 10 day(s).   Specialty: Family Medicine Contact information: [address] [place] [address] [phone]                 The results of significant diagnostics from this hospitalization (including imaging, microbiology, ancillary and laboratory) are listed below for reference.    Significant Diagnostic Studies: CT Head Wo Contrast  Result Date: 10/17/2021 CLINICAL DATA:  Altered mental status.  Possible fall. EXAM: CT HEAD WITHOUT CONTRAST TECHNIQUE: Contiguous axial images were obtained from the base of the skull through the vertex without intravenous contrast. COMPARISON:  CT head dated November 22, 2020. FINDINGS: Brain: No evidence of acute infarction, hemorrhage, hydrocephalus, extra-axial collection or mass lesion/mass effect. Stable mild atrophy and chronic microvascular ischemic changes. 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 Vascular: Atherosclerotic vascular calcification of the carotid siphons. No hyperdense vessel. Skull: Normal. Negative for fracture or focal lesion. Sinuses/Orbits: No acute finding. Other: None. IMPRESSION: 1. No acute intracranial abnormality. Electronically Signed   By: Obie Dredge M.D.   On: 10/17/2021 08:13    Microbiology: Recent Results (from the past 240 hour(s))  Resp Panel by RT-PCR (Flu A&B, Covid)  Nasopharyngeal Swab     Status: None   Collection Time: 10/17/21  8:12 AM   Specimen: Nasopharyngeal Swab; Nasopharyngeal(NP) swabs in vial transport medium  Result Value Ref Range Status   SARS Coronavirus 2 by RT PCR NEGATIVE NEGATIVE Final    Comment: (NOTE) SARS-CoV-2 target nucleic acids are NOT DETECTED.  The SARS-CoV-2 RNA is generally detectable in upper respiratory specimens during the acute phase of infection. The lowest concentration of SARS-CoV-2 viral copies this assay can detect is 138 copies/mL. A negative result does not preclude SARS-Cov-2 infection and should not be used as the sole basis for treatment or other patient management decisions. A negative result may occur with  improper specimen collection/handling, submission of specimen other than nasopharyngeal swab, presence of viral mutation(s) within the areas targeted by this assay, and inadequate number of viral copies(<138 copies/mL). A negative result must be combined with clinical observations, patient history, and epidemiological information. The expected result is Negative.  Fact Sheet for Patients:  BloggerCourse.com  Fact Sheet for Healthcare Providers:  SeriousBroker.it  This test is no t yet approved or cleared by the [place] FDA and  has been authorized for detection and/or diagnosis of SARS-CoV-2 by FDA under an Emergency Use Authorization (EUA). This EUA will remain  in effect (meaning this test can be used) for the duration of the COVID-19 declaration under Section 564(b)(1) of the Act, 21 U.S.C.section 360bbb-3(b)(1), unless the authorization is terminated  or revoked sooner.       Influenza A by PCR NEGATIVE NEGATIVE Final   Influenza B by PCR NEGATIVE NEGATIVE Final  Comment: (NOTE) The Xpert Xpress SARS-CoV-2/FLU/RSV plus assay is intended as an aid in the diagnosis of influenza from Nasopharyngeal swab specimens and should not be  used as a sole basis for treatment. Nasal washings and aspirates are unacceptable for Xpert Xpress SARS-CoV-2/FLU/RSV testing.  Fact Sheet for Patients: BloggerCourse.com  Fact Sheet for Healthcare Providers: SeriousBroker.it  This test is not yet approved or cleared by the [place] FDA and has been authorized for detection and/or diagnosis of SARS-CoV-2 by FDA under an Emergency Use Authorization (EUA). This EUA will remain in effect (meaning this test can be used) for the duration of the COVID-19 declaration under Section 564(b)(1) of the Act, 21 U.S.C. section 360bbb-3(b)(1), unless the authorization is terminated or revoked.  Performed at [facility], [address], [place], [place] [address]   MRSA Next Gen by PCR, Nasal     Status: None   Collection Time: 10/17/21  6:28 PM   Specimen: Nasal Mucosa; Nasal Swab  Result Value Ref Range Status   MRSA by PCR Next Gen NOT DETECTED NOT DETECTED Final    Comment: (NOTE) The GeneXpert MRSA Assay (FDA approved for NASAL specimens only), is one component of a comprehensive MRSA colonization surveillance program. It is not intended to diagnose MRSA infection nor to guide or monitor treatment for MRSA infections. Test performance is not FDA approved in patients less than 69 years old. Performed at [facility], [address] [place], [place] [address]      Labs: Basic Metabolic Panel: Recent Labs  Lab 10/17/21 0726 10/17/21 1112 10/17/21 1208 10/17/21 2334 10/18/21 0412 10/19/21 0981 10/20/21 0129 10/21/21 0521  NA  --   --    < > 140 140 143 140 139  K  --   --    < > 3.0* 2.9* 3.1* 2.9* 3.6  CL  --   --    < > 112* 114* 115* 116* 113*  CO2  --   --    < > 19* 17* 18* 17* 19*  GLUCOSE  --   --    < > 193* 159* 88 89 88  BUN  --   --    < > 15 16 21 15 10   CREATININE  --   --    < > 0.74 0.80 0.98 0.73 0.70  CALCIUM  --   --    < > 9.2 9.0 8.8* 8.1* 8.6*  MG 1.9   --   --   --  1.4* 1.4* 1.4* 1.6*  PHOS  --  3.6  --   --   --   --   --   --    < > = values in this interval not displayed.   Liver Function Tests: Recent Labs  Lab 10/17/21 0720  AST 54*  ALT 68*  ALKPHOS 79  BILITOT 0.8  PROT 7.3  ALBUMIN 4.0    Recent Labs  Lab 10/17/21 1208  AMMONIA 21   CBC: Recent Labs  Lab 10/17/21 0720 10/19/21 0634  WBC 4.3 13.6*  NEUTROABS 3.0  --   HGB 13.3 14.4  HCT 37.1 43.6  MCV 92.5 95.6  PLT 84* 105*   CBG: Recent Labs  Lab 10/20/21 1135 10/20/21 1609 10/20/21 2129 10/21/21 0720 10/21/21 1129  GLUCAP 115* 134* 95 89 237*    Signed:  Vassie Loll MD.  [facility] 10/21/2021, 12:35 PM

## 2021-10-21 NOTE — Progress Notes (Signed)
Patient's bed alarm going off at 0445. Patient found outside of bathroom holding onto linen cart. Stool on the bed and on floor. Patient stated she pooped in the bed and then got up to clean herself up. Patient alert and oriented at this time, but stated she is forgetful and just thought she could clean herself up. Patient educated on safety precautions. Patient stated when she got poop on her feet she slid down onto the floor hitting on her buttocks. Patient stated once she fell she was able to get up without assist. Patient assessed, no signs on injury. Patient denies pain at this time. Given bath. Dr. Carren Rang notified. No new orders at this time.

## 2021-10-21 NOTE — TOC Initial Note (Signed)
Transition of Care [facility]) - Initial/Assessment Note    Patient Details  Name: Shannon Herrera MRN: [id number] Date of Birth: 06-12-1956  Transition of [facility]) CM/SW Contact:    Karn Cassis, LCSW Phone Number: 10/21/2021, 11:31 AM  Clinical Narrative:  Pt admitted due to DKA and acute encephalopathy. She lives with her husband. 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 Pt plans to take a little time off after d/c before returning to work. PT evaluated pt and recommend HHPT. Discussed with pt who refuses. She states she has no interest in any home health. TOC received consult for substance abuse. Pt states she drinks about a half glass of wine a day. She does not feel this is a problem for her and declines any resources. No other needs reported at this time. TOC will continue to follow.                  Expected Discharge Plan: Home/Self Care Barriers to Discharge: Barriers Resolved   Patient Goals and CMS Choice Patient states their goals for this hospitalization and ongoing recovery are:: return home   Choice offered to / list presented to : Patient  Expected Discharge Plan and Services Expected Discharge Plan: Home/Self Care In-house Referral: Clinical Social Work     Living arrangements for the past 2 months: Apartment                                      Prior Living Arrangements/Services Living arrangements for the past 2 months: Apartment Lives with:: Spouse Patient language and need for interpreter reviewed:: Yes Do you feel safe going back to the place where you live?: Yes      Need for Family Participation in Patient Care: No (Comment)     Criminal Activity/Legal Involvement Pertinent to Current Situation/Hospitalization: No - Comment as needed  Activities of Daily Living Home Assistive Devices/Equipment: None ADL Screening (condition at time of  admission) Patient's cognitive ability adequate to safely complete daily activities?: Yes Is the patient deaf or have difficulty hearing?: No Does the patient have difficulty seeing, even when wearing glasses/contacts?: Yes Does the patient have difficulty concentrating, remembering, or making decisions?: Yes Patient able to express need for assistance with ADLs?: Yes Does the patient have difficulty dressing or bathing?: No Independently performs ADLs?: Yes (appropriate for developmental age) Does the patient have difficulty walking or climbing stairs?: No Weakness of Legs: None Weakness of Arms/Hands: None  Permission Sought/Granted                  Emotional Assessment   Attitude/Demeanor/Rapport: Engaged Affect (typically observed): Appropriate Orientation: : Oriented to Self, Oriented to Place, Oriented to  Time, Oriented to Situation Alcohol / Substance Use: Alcohol Use    Admission diagnosis:  DKA (diabetic ketoacidosis) (HCC) [E11.10] Altered mental status, unspecified altered mental status type [R41.82] Patient Active Problem List   Diagnosis Date Noted   DKA (diabetic ketoacidosis) (HCC) 10/17/2021   Acute cystitis without hematuria    Seizure disorder (HCC) 04/23/2020   Hyperosmolar non-ketotic state due to type 2 diabetes mellitus (HCC) 04/23/2020   Nonketotic hyperglycinemia, type II (HCC) 04/22/2020   SAH (subarachnoid hemorrhage) (HCC) 04/30/2019   DKA, type 2, not at goal [facility]) 02/08/2019   Status epilepticus (HCC) 02/08/2019  Alcohol abuse 02/08/2019   Acute metabolic encephalopathy 02/05/2019   Diabetic acidosis without coma (HCC)    H/O ETOH abuse    C. difficile colitis 07/05/2018   Syncope and collapse 07/03/2018   Hyperglycemia 01/22/2018   Essential hypertension 01/22/2018   Alcohol withdrawal syndrome with complication, with unspecified complication (HCC) 01/22/2018   Elevated LFTs 01/22/2018   Lactic acidosis 01/22/2018   Hypothyroidism  01/22/2018   Thrombocytopenia (HCC) 01/22/2018   Hypomagnesemia 01/22/2018   Acute renal injury (HCC) 01/22/2018   PCP:  Wilford Corner, PA-C Pharmacy:   [facility] [address] [place] [address] [place] [address] Phone: [phone] Fax: [phone]  [facility] [address] [place] [address] Phone: [phone] Fax: [phone]     Social Determinants of Health (SDOH) Interventions    Readmission Risk Interventions No flowsheet data found.

## 2021-12-19 ENCOUNTER — Other Ambulatory Visit: Payer: Self-pay | Admitting: Family Medicine

## 2021-12-19 DIAGNOSIS — R768 Other specified abnormal immunological findings in serum: Secondary | ICD-10-CM

## 2021-12-30 ENCOUNTER — Ambulatory Visit
Admission: RE | Admit: 2021-12-30 | Discharge: 2021-12-30 | Disposition: A | Discharge status: Home / Self Care | Payer: Medicare HMO | Source: Ambulatory Visit | Attending: Family Medicine | Admitting: Family Medicine

## 2021-12-30 ENCOUNTER — Other Ambulatory Visit: Payer: Self-pay

## 2021-12-30 DIAGNOSIS — R768 Other specified abnormal immunological findings in serum: Secondary | ICD-10-CM | POA: Diagnosis present

## 2023-05-10 ENCOUNTER — Other Ambulatory Visit: Payer: Self-pay | Admitting: Gastroenterology

## 2023-05-10 DIAGNOSIS — R7989 Other specified abnormal findings of blood chemistry: Secondary | ICD-10-CM

## 2023-05-10 DIAGNOSIS — D696 Thrombocytopenia, unspecified: Secondary | ICD-10-CM

## 2023-05-10 DIAGNOSIS — B182 Chronic viral hepatitis C: Secondary | ICD-10-CM

## 2023-05-29 IMAGING — CT CT HEAD W/O CM
3 series · 14 of 47 positions shown, 16 images · non-contrast
Comparison: CT head dated November 22, 2020.

CLINICAL DATA: Altered mental status.  Possible fall.

EXAM:
CT HEAD WITHOUT CONTRAST
TECHNIQUE: Contiguous axial images were obtained from the base of the skull
through the vertex without intravenous contrast.

[Series 2: head ax w o · axial · 0.33mm/px · z∈[-2,+136]mm · 8 of 34 slices shown, 10 images]
[im 3/34  brain]
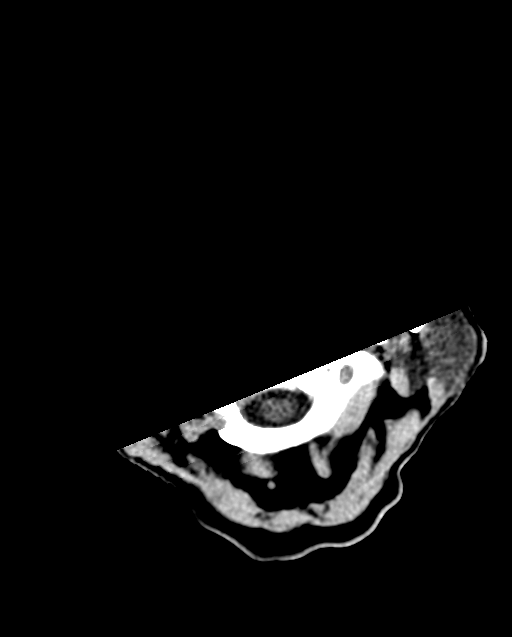
[im 3/34  bone]
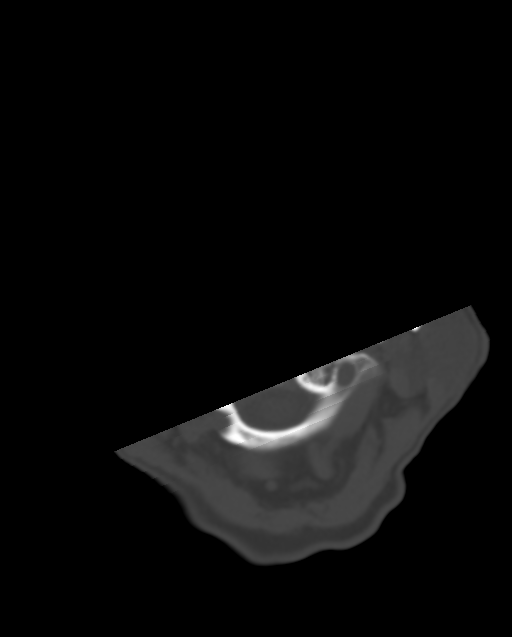
[im 7/34  brain]
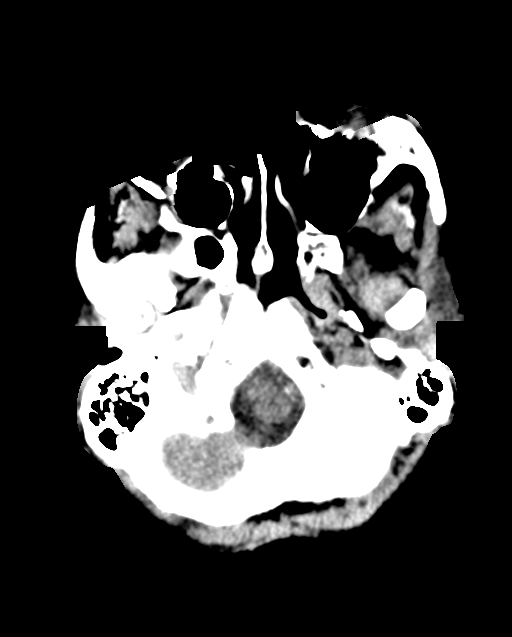
[im 11/34  brain]
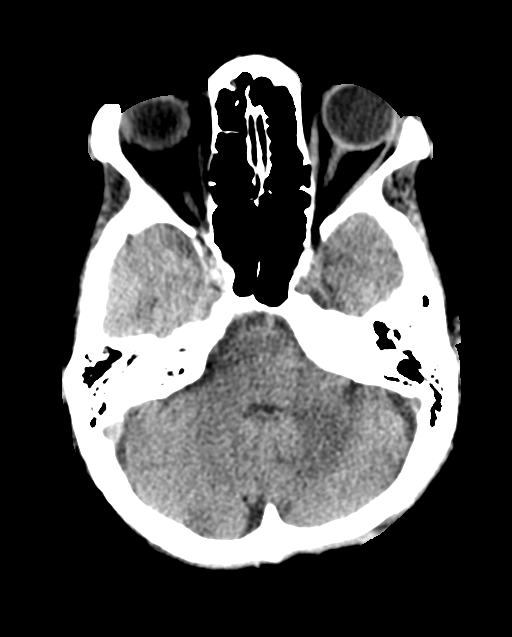
[im 15/34  brain]
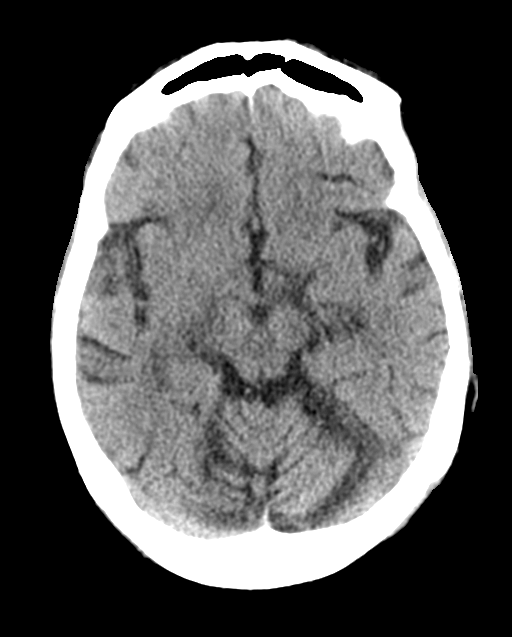
[im 19/34  brain]
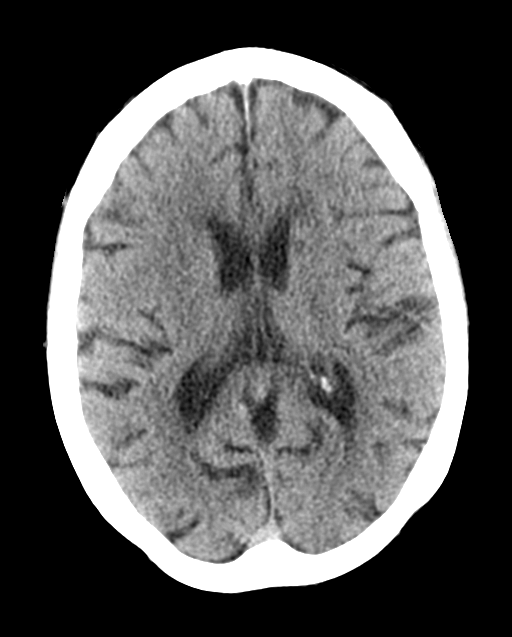
[im 19/34  bone]
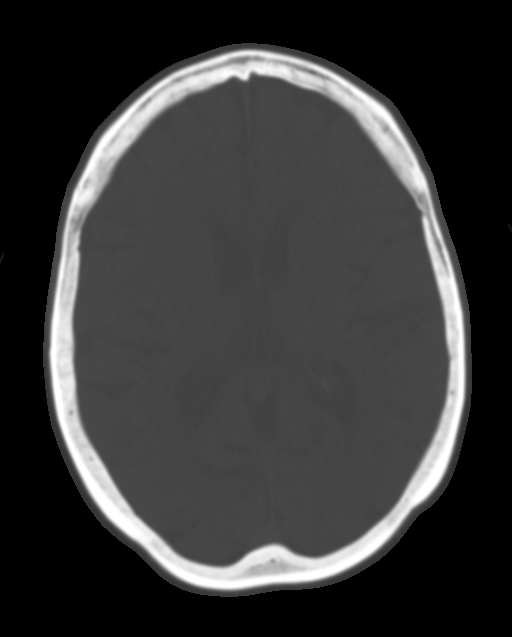
[im 23/34  brain]
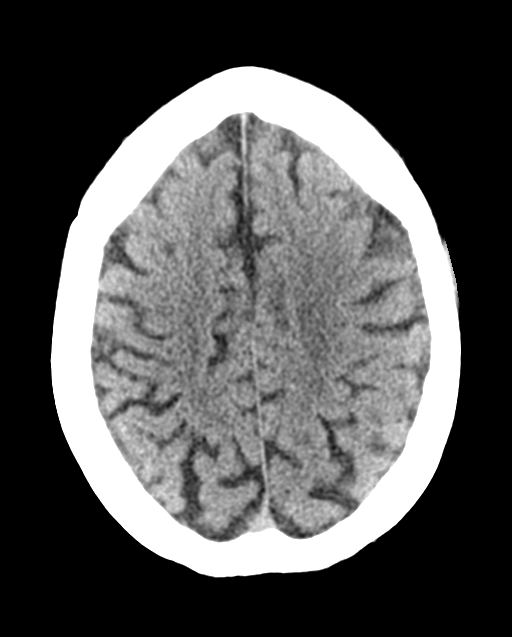
[im 27/34  brain]
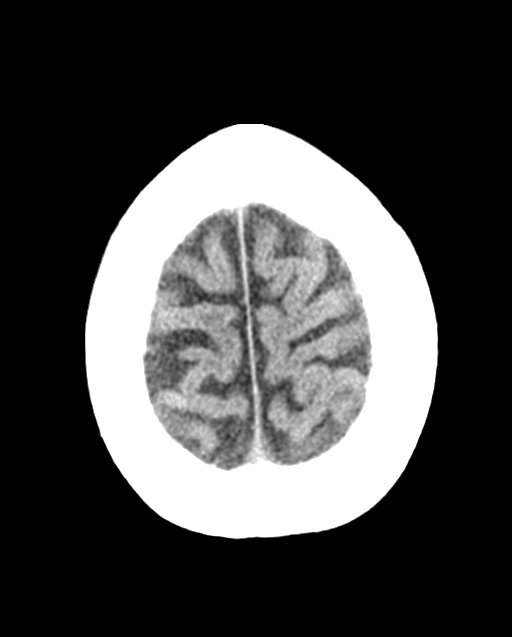
[im 31/34  brain]
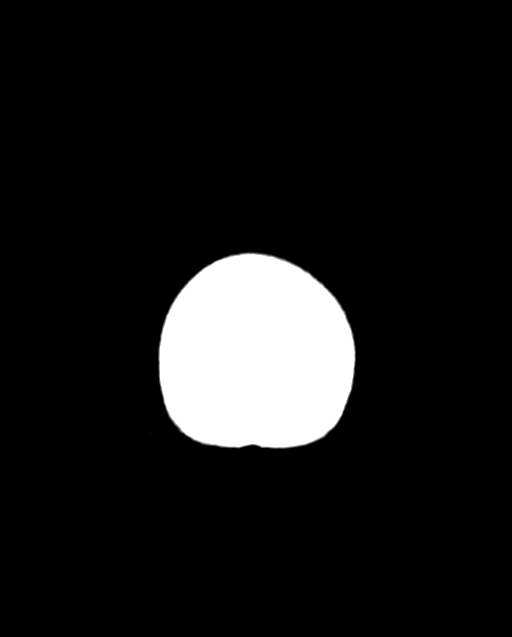

[Series 3: coronal soft · coronal · 0.32mm/px · 3 of 69 slices shown]
[im 23/69  brain]
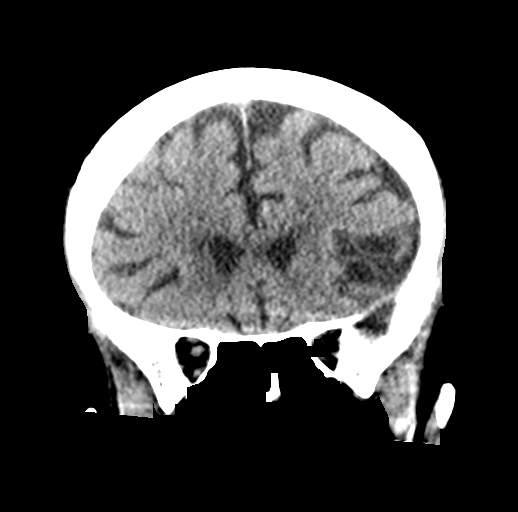
[im 31/69  brain]
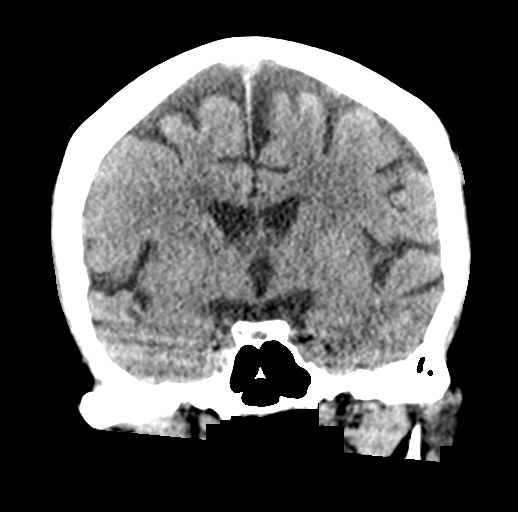
[im 38/69  brain]
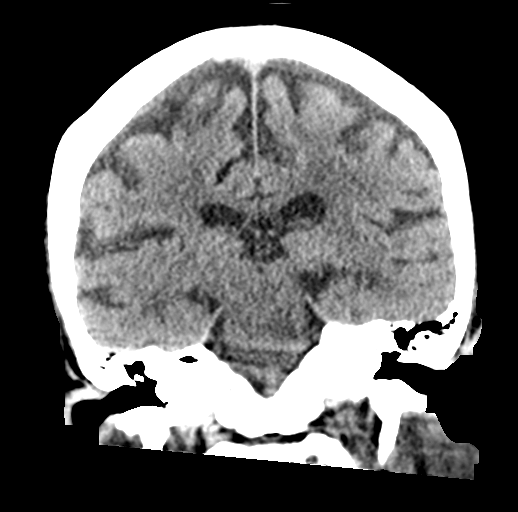

[Series 4: sagittal soft · sagittal · 0.32mm/px · 3 of 57 slices shown]
[im 19/57  brain]
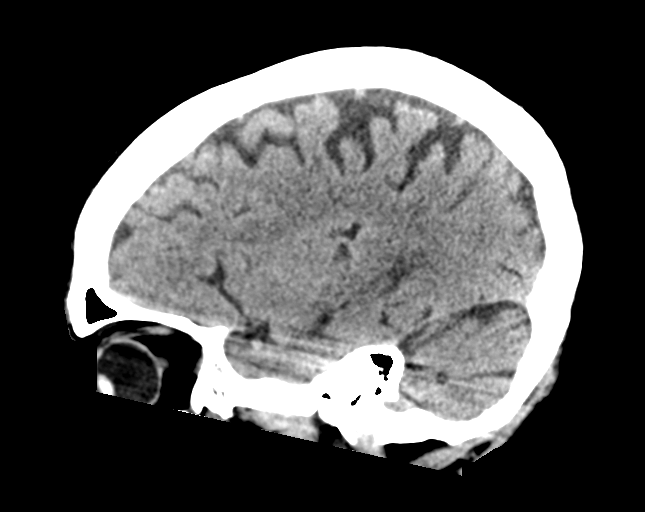
[im 29/57  brain]
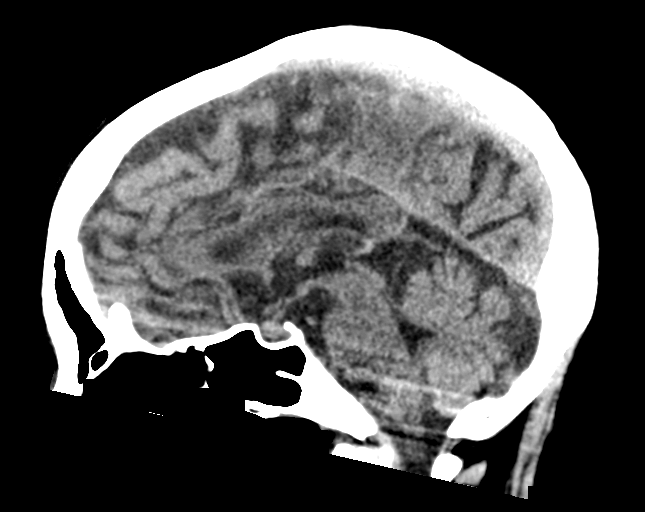
[im 38/57  brain]
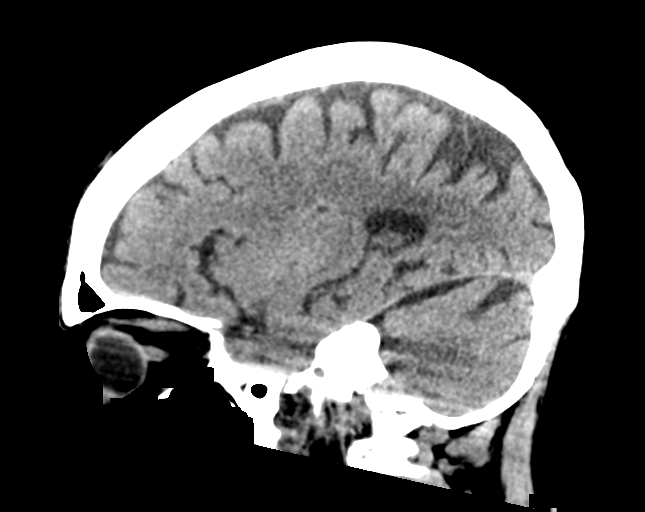

[14 of 47 positions shown; findings below may reference images not displayed]

FINDINGS: Brain: No evidence of acute infarction, hemorrhage, hydrocephalus,
extra-axial collection or mass lesion/mass effect. Stable mild
atrophy and chronic microvascular ischemic changes. Old left caudate
lacunar infarct again noted.

Vascular: Atherosclerotic vascular calcification of the carotid
siphons. No hyperdense vessel.

Skull: Normal. Negative for fracture or focal lesion.

Sinuses/Orbits: No acute finding.

Other: None.
IMPRESSION: 1. No acute intracranial abnormality.

## 2023-07-06 ENCOUNTER — Other Ambulatory Visit: Payer: Self-pay | Admitting: Family Medicine

## 2023-07-06 DIAGNOSIS — D696 Thrombocytopenia, unspecified: Secondary | ICD-10-CM

## 2023-07-06 DIAGNOSIS — Z8619 Personal history of other infectious and parasitic diseases: Secondary | ICD-10-CM

## 2023-08-02 ENCOUNTER — Other Ambulatory Visit: Payer: Medicare HMO

## 2023-08-03 ENCOUNTER — Other Ambulatory Visit: Payer: Self-pay | Admitting: Nephrology

## 2023-08-03 ENCOUNTER — Ambulatory Visit: Payer: Medicare HMO

## 2023-08-03 DIAGNOSIS — E1129 Type 2 diabetes mellitus with other diabetic kidney complication: Secondary | ICD-10-CM

## 2024-10-19 ENCOUNTER — Emergency Department (HOSPITAL_COMMUNITY)
Admission: EM | Admit: 2024-10-19 | Discharge: 2024-10-19 | Disposition: A | Discharge status: Home / Self Care | Attending: Emergency Medicine | Admitting: Emergency Medicine

## 2024-10-19 ENCOUNTER — Encounter (HOSPITAL_COMMUNITY): Payer: Self-pay | Admitting: Emergency Medicine

## 2024-10-19 ENCOUNTER — Other Ambulatory Visit: Payer: Self-pay

## 2024-10-19 ENCOUNTER — Emergency Department (HOSPITAL_COMMUNITY)

## 2024-10-19 DIAGNOSIS — Y92410 Unspecified street and highway as the place of occurrence of the external cause: Secondary | ICD-10-CM | POA: Diagnosis not present

## 2024-10-19 DIAGNOSIS — S8001XA Contusion of right knee, initial encounter: Secondary | ICD-10-CM | POA: Insufficient documentation

## 2024-10-19 DIAGNOSIS — Z794 Long term (current) use of insulin: Secondary | ICD-10-CM | POA: Diagnosis not present

## 2024-10-19 DIAGNOSIS — S8991XA Unspecified injury of right lower leg, initial encounter: Secondary | ICD-10-CM | POA: Diagnosis present

## 2024-10-19 DIAGNOSIS — S80211A Abrasion, right knee, initial encounter: Secondary | ICD-10-CM

## 2024-10-19 NOTE — Discharge Instructions (Signed)
Tylenol or ibuprofen for discomfort.  Return if any problems.
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
## 2024-10-19 NOTE — ED Triage Notes (Signed)
 Pts BP 207/80. States she has bp meds for at night but has not been taking them.
